# Patient Record
Sex: Female | Born: 1975
Health system: Southern US, Community
[De-identification: ages and names within clinical notes are randomized; demographics above are authoritative.]

## PROBLEM LIST (undated history)

## (undated) DIAGNOSIS — R51 Headache: Secondary | ICD-10-CM

## (undated) DIAGNOSIS — T7840XA Allergy, unspecified, initial encounter: Secondary | ICD-10-CM

## (undated) DIAGNOSIS — R519 Headache, unspecified: Secondary | ICD-10-CM

## (undated) DIAGNOSIS — H4710 Unspecified papilledema: Secondary | ICD-10-CM

## (undated) DIAGNOSIS — K30 Functional dyspepsia: Secondary | ICD-10-CM

## (undated) DIAGNOSIS — E559 Vitamin D deficiency, unspecified: Secondary | ICD-10-CM

## (undated) DIAGNOSIS — K219 Gastro-esophageal reflux disease without esophagitis: Secondary | ICD-10-CM

## (undated) DIAGNOSIS — M1990 Unspecified osteoarthritis, unspecified site: Secondary | ICD-10-CM

## (undated) DIAGNOSIS — F419 Anxiety disorder, unspecified: Secondary | ICD-10-CM

## (undated) DIAGNOSIS — K589 Irritable bowel syndrome without diarrhea: Secondary | ICD-10-CM

## (undated) DIAGNOSIS — I10 Essential (primary) hypertension: Secondary | ICD-10-CM

## (undated) DIAGNOSIS — R0981 Nasal congestion: Secondary | ICD-10-CM

## (undated) DIAGNOSIS — S248XXA Injury of other specified nerves of thorax, initial encounter: Secondary | ICD-10-CM

## (undated) DIAGNOSIS — T8859XA Other complications of anesthesia, initial encounter: Secondary | ICD-10-CM

## (undated) DIAGNOSIS — T4145XA Adverse effect of unspecified anesthetic, initial encounter: Secondary | ICD-10-CM

## (undated) HISTORY — DX: Gastro-esophageal reflux disease without esophagitis: K21.9

## (undated) HISTORY — DX: Unspecified papilledema: H47.10

## (undated) HISTORY — DX: Allergy, unspecified, initial encounter: T78.40XA

## (undated) HISTORY — DX: Functional dyspepsia: K30

## (undated) HISTORY — DX: Headache, unspecified: R51.9

## (undated) HISTORY — DX: Essential (primary) hypertension: I10

## (undated) HISTORY — DX: Nasal congestion: R09.81

## (undated) HISTORY — DX: Headache: R51

## (undated) HISTORY — PX: NO PAST SURGERIES: SHX2092

## (undated) HISTORY — DX: Injury of other specified nerves of thorax, initial encounter: S24.8XXA

## (undated) HISTORY — DX: Irritable bowel syndrome, unspecified: K58.9

## (undated) HISTORY — DX: Unspecified osteoarthritis, unspecified site: M19.90

## (undated) HISTORY — DX: Anxiety disorder, unspecified: F41.9

## (undated) HISTORY — DX: Vitamin D deficiency, unspecified: E55.9

---

## 1997-10-23 ENCOUNTER — Other Ambulatory Visit: Admission: RE | Admit: 1997-10-23 | Discharge: 1997-10-23 | Payer: Self-pay | Admitting: *Deleted

## 1999-12-13 ENCOUNTER — Emergency Department (HOSPITAL_COMMUNITY): Admission: EM | Admit: 1999-12-13 | Discharge: 1999-12-14 | Payer: Self-pay | Admitting: Emergency Medicine

## 2003-01-03 ENCOUNTER — Other Ambulatory Visit: Admission: RE | Admit: 2003-01-03 | Discharge: 2003-01-03 | Payer: Self-pay | Admitting: Obstetrics and Gynecology

## 2004-01-22 ENCOUNTER — Other Ambulatory Visit: Admission: RE | Admit: 2004-01-22 | Discharge: 2004-01-22 | Payer: Self-pay | Admitting: Obstetrics and Gynecology

## 2005-02-07 ENCOUNTER — Other Ambulatory Visit: Admission: RE | Admit: 2005-02-07 | Discharge: 2005-02-07 | Payer: Self-pay | Admitting: Obstetrics and Gynecology

## 2007-12-29 ENCOUNTER — Inpatient Hospital Stay (HOSPITAL_COMMUNITY): Admission: AD | Admit: 2007-12-29 | Discharge: 2008-01-01 | Payer: Self-pay | Admitting: Obstetrics and Gynecology

## 2007-12-30 ENCOUNTER — Encounter (INDEPENDENT_AMBULATORY_CARE_PROVIDER_SITE_OTHER): Payer: Self-pay | Admitting: Obstetrics and Gynecology

## 2008-01-04 ENCOUNTER — Encounter: Admission: RE | Admit: 2008-01-04 | Discharge: 2008-02-01 | Payer: Self-pay | Admitting: Obstetrics and Gynecology

## 2010-10-14 ENCOUNTER — Other Ambulatory Visit: Payer: Self-pay | Admitting: Obstetrics and Gynecology

## 2010-11-29 LAB — HIV ANTIBODY (ROUTINE TESTING W REFLEX): HIV: NONREACTIVE

## 2010-11-29 LAB — VARICELLA ZOSTER ANTIBODY, IGG: Varicella: IMMUNE

## 2010-11-29 LAB — RPR: RPR: NONREACTIVE

## 2010-11-29 LAB — GC/CHLAMYDIA PROBE AMP, GENITAL: Gonorrhea: NEGATIVE

## 2010-12-14 LAB — RPR: RPR Ser Ql: NONREACTIVE

## 2010-12-14 LAB — CBC
Hemoglobin: 13.1
MCHC: 34
MCHC: 34.3
MCV: 92.3
Platelets: 206
Platelets: 233
RDW: 13.3
RDW: 13.4
WBC: 11.5 — ABNORMAL HIGH

## 2011-02-02 ENCOUNTER — Other Ambulatory Visit (HOSPITAL_COMMUNITY): Payer: Self-pay | Admitting: Obstetrics and Gynecology

## 2011-02-02 DIAGNOSIS — O09299 Supervision of pregnancy with other poor reproductive or obstetric history, unspecified trimester: Secondary | ICD-10-CM

## 2011-03-01 ENCOUNTER — Ambulatory Visit (HOSPITAL_COMMUNITY): Payer: 59 | Attending: Obstetrics and Gynecology

## 2011-03-15 NOTE — L&D Delivery Note (Signed)
Delivery Note At 7:45 PM a viable and healthy female was delivered via Vaginal, Spontaneous Delivery (Presentation: Left Occiput Anterior).  APGAR: 9, 9; weight 5 lb 15.8 oz (2716 g).   Placenta status: Intact, Spontaneous.  Cord: 3 vessels with the following complications: Short.   The patient delivered a vigorous female infant in the vertex LOA presentation weighing 5#15.8 with apgars 9&9. The umbilical cord was short and was clamped and cut and the infant passed to the waiting maternal abdomen. The infant cried vigorously.  The placenta delivered spontaneously, intact with a 3VC.  Cord blood was attempted to be collected for donation but the amount collected was insufficient.  No lacerations required repair.  The patient had a boggy lower uterine segment which responded to bimanual massage.  of misoprostal were placed transrectally prophylactically   Anesthesia: Epidural  Episiotomy: None Lacerations: None Suture Repair: N/A Est. Blood Loss (mL): 450 cc  Mom to postpartum.  Baby to nursery-stable.  Desaree Downen H. 06/01/2011, 8:20 PM

## 2011-06-01 ENCOUNTER — Inpatient Hospital Stay (HOSPITAL_COMMUNITY): Payer: 59 | Admitting: Anesthesiology

## 2011-06-01 ENCOUNTER — Inpatient Hospital Stay (HOSPITAL_COMMUNITY)
Admission: RE | Admit: 2011-06-01 | Discharge: 2011-06-03 | DRG: 775 | Disposition: A | Discharge status: Home / Self Care | Payer: 59 | Source: Ambulatory Visit | Attending: Obstetrics and Gynecology | Admitting: Obstetrics and Gynecology

## 2011-06-01 ENCOUNTER — Encounter (HOSPITAL_COMMUNITY): Payer: Self-pay

## 2011-06-01 ENCOUNTER — Encounter (HOSPITAL_COMMUNITY): Payer: Self-pay | Admitting: Anesthesiology

## 2011-06-01 VITALS — BP 123/80 | HR 71 | Temp 97.8°F | Resp 18 | Ht 66.0 in | Wt 235.0 lb

## 2011-06-01 DIAGNOSIS — O4100X0 Oligohydramnios, unspecified trimester, not applicable or unspecified: Principal | ICD-10-CM | POA: Diagnosis present

## 2011-06-01 DIAGNOSIS — Z3480 Encounter for supervision of other normal pregnancy, unspecified trimester: Secondary | ICD-10-CM

## 2011-06-01 HISTORY — DX: Adverse effect of unspecified anesthetic, initial encounter: T41.45XA

## 2011-06-01 HISTORY — DX: Other complications of anesthesia, initial encounter: T88.59XA

## 2011-06-01 LAB — ABO/RH: ABO/RH(D): O POS

## 2011-06-01 LAB — CBC
HCT: 40.9 % (ref 36.0–46.0)
MCV: 89.3 fL (ref 78.0–100.0)
RBC: 4.58 MIL/uL (ref 3.87–5.11)
WBC: 10.5 10*3/uL (ref 4.0–10.5)

## 2011-06-01 LAB — TYPE AND SCREEN

## 2011-06-01 MED ORDER — BENZOCAINE-MENTHOL 20-0.5 % EX AERO
1.0000 "application " | INHALATION_SPRAY | CUTANEOUS | Status: DC | PRN
  Administered 2011-06-01: 1 via TOPICAL

## 2011-06-01 MED ORDER — BUTORPHANOL TARTRATE 2 MG/ML IJ SOLN: 1.0000 mg | INTRAMUSCULAR | Status: DC | PRN

## 2011-06-01 MED ORDER — LACTATED RINGERS IV SOLN: 500.0000 mL | Freq: Once | INTRAVENOUS | Status: DC

## 2011-06-01 MED ORDER — SIMETHICONE 80 MG PO CHEW: 80.0000 mg | CHEWABLE_TABLET | ORAL | Status: DC | PRN

## 2011-06-01 MED ORDER — FENTANYL 2.5 MCG/ML BUPIVACAINE 1/10 % EPIDURAL INFUSION (WH - ANES)
INTRAMUSCULAR | Status: DC | PRN
  Administered 2011-06-01: 13 mL/h via EPIDURAL

## 2011-06-01 MED ORDER — CITRIC ACID-SODIUM CITRATE 334-500 MG/5ML PO SOLN: 30.0000 mL | ORAL | Status: DC | PRN

## 2011-06-01 MED ORDER — ACETAMINOPHEN 325 MG PO TABS: 650.0000 mg | ORAL_TABLET | ORAL | Status: DC | PRN

## 2011-06-01 MED ORDER — METHYLERGONOVINE MALEATE 0.2 MG PO TABS: 0.2000 mg | ORAL_TABLET | ORAL | Status: DC | PRN

## 2011-06-01 MED ORDER — OXYTOCIN BOLUS FROM INFUSION
500.0000 mL | Freq: Once | INTRAVENOUS | Status: DC
  Filled 2011-06-01: qty 500
  Filled 2011-06-01: qty 1000

## 2011-06-01 MED ORDER — LIDOCAINE HCL (PF) 1 % IJ SOLN
30.0000 mL | INTRAMUSCULAR | Status: DC | PRN
  Filled 2011-06-01: qty 30

## 2011-06-01 MED ORDER — WITCH HAZEL-GLYCERIN EX PADS: 1.0000 "application " | MEDICATED_PAD | CUTANEOUS | Status: DC | PRN

## 2011-06-01 MED ORDER — PRENATAL MULTIVITAMIN CH
1.0000 | ORAL_TABLET | Freq: Every day | ORAL | Status: DC
  Administered 2011-06-02 – 2011-06-03 (×2): 1 via ORAL
  Filled 2011-06-01 (×2): qty 1

## 2011-06-01 MED ORDER — EPHEDRINE 5 MG/ML INJ
10.0000 mg | INTRAVENOUS | Status: DC | PRN
  Filled 2011-06-01: qty 4

## 2011-06-01 MED ORDER — OXYCODONE-ACETAMINOPHEN 5-325 MG PO TABS: 1.0000 | ORAL_TABLET | ORAL | Status: DC | PRN

## 2011-06-01 MED ORDER — ZOLPIDEM TARTRATE 10 MG PO TABS: 10.0000 mg | ORAL_TABLET | Freq: Every evening | ORAL | Status: DC | PRN

## 2011-06-01 MED ORDER — METHYLERGONOVINE MALEATE 0.2 MG/ML IJ SOLN: 0.2000 mg | INTRAMUSCULAR | Status: DC | PRN

## 2011-06-01 MED ORDER — IBUPROFEN 600 MG PO TABS: 600.0000 mg | ORAL_TABLET | Freq: Four times a day (QID) | ORAL | Status: DC | PRN

## 2011-06-01 MED ORDER — DIBUCAINE 1 % RE OINT: 1.0000 "application " | TOPICAL_OINTMENT | RECTAL | Status: DC | PRN

## 2011-06-01 MED ORDER — IBUPROFEN 600 MG PO TABS
600.0000 mg | ORAL_TABLET | Freq: Four times a day (QID) | ORAL | Status: DC
  Administered 2011-06-01 – 2011-06-03 (×6): 600 mg via ORAL
  Filled 2011-06-01 (×6): qty 1

## 2011-06-01 MED ORDER — SENNOSIDES-DOCUSATE SODIUM 8.6-50 MG PO TABS
2.0000 | ORAL_TABLET | Freq: Every day | ORAL | Status: DC
  Administered 2011-06-02: 2 via ORAL

## 2011-06-01 MED ORDER — PHENYLEPHRINE 40 MCG/ML (10ML) SYRINGE FOR IV PUSH (FOR BLOOD PRESSURE SUPPORT)
80.0000 ug | PREFILLED_SYRINGE | INTRAVENOUS | Status: DC | PRN
  Filled 2011-06-01: qty 5

## 2011-06-01 MED ORDER — EPHEDRINE 5 MG/ML INJ: 10.0000 mg | INTRAVENOUS | Status: DC | PRN

## 2011-06-01 MED ORDER — LIDOCAINE HCL (PF) 1 % IJ SOLN
INTRAMUSCULAR | Status: DC | PRN
  Administered 2011-06-01 (×2): 4 mL

## 2011-06-01 MED ORDER — BENZOCAINE-MENTHOL 20-0.5 % EX AERO
INHALATION_SPRAY | CUTANEOUS | Status: AC
  Administered 2011-06-01: 1 via TOPICAL
  Filled 2011-06-01: qty 56

## 2011-06-01 MED ORDER — ONDANSETRON HCL 4 MG/2ML IJ SOLN: 4.0000 mg | INTRAMUSCULAR | Status: DC | PRN

## 2011-06-01 MED ORDER — TERBUTALINE SULFATE 1 MG/ML IJ SOLN: 0.2500 mg | Freq: Once | INTRAMUSCULAR | Status: DC | PRN

## 2011-06-01 MED ORDER — LACTATED RINGERS IV SOLN
500.0000 mL | INTRAVENOUS | Status: DC | PRN
  Administered 2011-06-01 (×2): 1000 mL via INTRAVENOUS

## 2011-06-01 MED ORDER — LACTATED RINGERS IV SOLN
INTRAVENOUS | Status: DC
  Administered 2011-06-01 (×3): via INTRAVENOUS

## 2011-06-01 MED ORDER — TETANUS-DIPHTH-ACELL PERTUSSIS 5-2.5-18.5 LF-MCG/0.5 IM SUSP
0.5000 mL | Freq: Once | INTRAMUSCULAR | Status: AC
  Administered 2011-06-02: 0.5 mL via INTRAMUSCULAR
  Filled 2011-06-01: qty 0.5

## 2011-06-01 MED ORDER — ZOLPIDEM TARTRATE 5 MG PO TABS: 5.0000 mg | ORAL_TABLET | Freq: Every evening | ORAL | Status: DC | PRN

## 2011-06-01 MED ORDER — DIPHENHYDRAMINE HCL 50 MG/ML IJ SOLN: 12.5000 mg | INTRAMUSCULAR | Status: DC | PRN

## 2011-06-01 MED ORDER — FLEET ENEMA 7-19 GM/118ML RE ENEM: 1.0000 | ENEMA | RECTAL | Status: DC | PRN

## 2011-06-01 MED ORDER — FENTANYL 2.5 MCG/ML BUPIVACAINE 1/10 % EPIDURAL INFUSION (WH - ANES)
14.0000 mL/h | INTRAMUSCULAR | Status: DC
  Administered 2011-06-01: 14 mL/h via EPIDURAL
  Filled 2011-06-01 (×2): qty 60

## 2011-06-01 MED ORDER — DIPHENHYDRAMINE HCL 25 MG PO CAPS: 25.0000 mg | ORAL_CAPSULE | Freq: Four times a day (QID) | ORAL | Status: DC | PRN

## 2011-06-01 MED ORDER — OXYTOCIN 20 UNITS IN LACTATED RINGERS INFUSION - SIMPLE
1.0000 m[IU]/min | INTRAVENOUS | Status: DC
  Administered 2011-06-01: 999 mL/h via INTRAVENOUS
  Filled 2011-06-01: qty 1000

## 2011-06-01 MED ORDER — ONDANSETRON HCL 4 MG/2ML IJ SOLN: 4.0000 mg | Freq: Four times a day (QID) | INTRAMUSCULAR | Status: DC | PRN

## 2011-06-01 MED ORDER — OXYTOCIN 20 UNITS IN LACTATED RINGERS INFUSION - SIMPLE
125.0000 mL/h | Freq: Once | INTRAVENOUS | Status: AC
  Administered 2011-06-01: 999 mL/h via INTRAVENOUS

## 2011-06-01 MED ORDER — MISOPROSTOL 200 MCG PO TABS
ORAL_TABLET | ORAL | Status: AC
  Administered 2011-06-01: 800 ug
  Filled 2011-06-01: qty 4

## 2011-06-01 MED ORDER — ONDANSETRON HCL 4 MG PO TABS: 4.0000 mg | ORAL_TABLET | ORAL | Status: DC | PRN

## 2011-06-01 MED ORDER — PHENYLEPHRINE 40 MCG/ML (10ML) SYRINGE FOR IV PUSH (FOR BLOOD PRESSURE SUPPORT): 80.0000 ug | PREFILLED_SYRINGE | INTRAVENOUS | Status: DC | PRN

## 2011-06-01 MED ORDER — LANOLIN HYDROUS EX OINT: TOPICAL_OINTMENT | CUTANEOUS | Status: DC | PRN

## 2011-06-01 NOTE — Anesthesia Procedure Notes (Signed)
Epidural Patient location during procedure: OB Start time: 06/01/2011 11:45 AM  Staffing Anesthesiologist: Brinna Divelbiss A. Performed by: anesthesiologist   Preanesthetic Checklist Completed: patient identified, site marked, surgical consent, pre-op evaluation, timeout performed, IV checked, risks and benefits discussed and monitors and equipment checked  Epidural Patient position: sitting Prep: site prepped and draped and DuraPrep Patient monitoring: continuous pulse ox and blood pressure Approach: midline Injection technique: LOR air  Needle:  Needle type: Tuohy  Needle gauge: 17 G Needle length: 9 cm Needle insertion depth: 6 cm Catheter type: closed end flexible Catheter size: 19 Gauge Catheter at skin depth: 11 cm Test dose: negative and Other  Assessment Events: blood not aspirated, injection not painful, no injection resistance, negative IV test and no paresthesia  Additional Notes Patient identified. Risks and benefits discussed including failed block, incomplete  Pain control, post dural puncture headache, nerve damage, paralysis, blood pressure Changes, nausea, vomiting, reactions to medications-both toxic and allergic and post Partum back pain. All questions were answered. Patient expressed understanding and wished to proceed. Sterile technique was used throughout procedure. Epidural site was Dressed with sterile barrier dressing. No paresthesias, signs of intravascular injection Or signs of intrathecal spread were encountered.  Patient was more comfortable after the epidural was dosed. Please see RN's note for documentation of vital signs and FHR which are stable.

## 2011-06-01 NOTE — Progress Notes (Signed)
Pt lying on side

## 2011-06-01 NOTE — Progress Notes (Signed)
Patient ID: Tammy Olsen, female   DOB: 14-Feb-1976, 36 y.o.   MRN: [id number]  S: Comfortable with epidural O: Filed Vitals:   06/01/11 1434 06/01/11 1505 06/01/11 1535 06/01/11 1635  BP: 99/58 110/69  109/75  Pulse: 69 67  71  Temp:      TempSrc:      Resp: 18 18 18 18   Height:      Weight:      SpO2:       AOX3, NAD FHT 130-140 + accels, no decels Cvx 4/60/-2 Toco: q2  IUPC placed  A/P 1) FWB reassuring 2) Continue pit

## 2011-06-01 NOTE — Anesthesia Preprocedure Evaluation (Signed)
Anesthesia Evaluation  Patient identified by MRN, date of birth, ID band Patient awake    Reviewed: Allergy & Precautions, H&P , Patient's Chart, lab work & pertinent test results  Airway Mallampati: III TM Distance: >3 FB Neck ROM: full    Dental No notable dental hx. (+) Teeth Intact   Pulmonary neg pulmonary ROS,  breath sounds clear to auscultation  Pulmonary exam normal       Cardiovascular negative cardio ROS  Rhythm:regular Rate:Normal     Neuro/Psych negative neurological ROS  negative psych ROS   GI/Hepatic negative GI ROS, Neg liver ROS,   Endo/Other  Morbid obesity  Renal/GU negative Renal ROS  negative genitourinary   Musculoskeletal negative musculoskeletal ROS (+)   Abdominal Normal abdominal exam  (+)   Peds  Hematology negative hematology ROS (+)   Anesthesia Other Findings   Reproductive/Obstetrics (+) Pregnancy                           Anesthesia Physical Anesthesia Plan  ASA: III  Anesthesia Plan: Epidural   Post-op Pain Management:    Induction:   Airway Management Planned:   Additional Equipment:   Intra-op Plan:   Post-operative Plan:   Informed Consent: I have reviewed the patients History and Physical, chart, labs and discussed the procedure including the risks, benefits and alternatives for the proposed anesthesia with the patient or authorized representative who has indicated his/her understanding and acceptance.     Plan Discussed with: Anesthesiologist  Anesthesia Plan Comments:         Anesthesia Quick Evaluation

## 2011-06-01 NOTE — H&P (Signed)
  Admission H&P  CC: IOL for Oligohydramnios/IUGR HPI: Ms. Tammy Olsen is a 36 year old gravida 2 para 1001 who presents at 21 weeks and 6 days estimated gestational age for induction of labor for oligohydramnios. The patient has a history of IUGR with her first child and has been followed with serial growth scans during this pregnancy. At 32 week it was noted that the [facility] growth is beginning to lag as it was less than 2.3%. At this time, the patient began having growth scans every 2 weeks. With serial growth scans persistent AC lag was noted And by 36 weeks estimated fetal weight was less than 10%. At this point the patient was followed with serial BPPs/NSTs, UA Dopplers, and AFIs. Over the next several weeks it was noted that the umbilical artery Dopplers were beginning to increase, the growth restriction with persistent, and the AFI was declining. On ultrasound on Monday 3/18 the AFI was 4.9 and the decision was made to proceed with induction of labor for oligohydramnios and IUGR. The patient has had poor weight gain during this pregnancy. PMH: None PSH: None POB/GYN: G2P1001  2009: 38 wk IOL female infant 5#9. Indxn for IUGR PNL: Blood type O+, antibody screen negative, RPR nonreactive, rubella titer immune, hepatitis B surface antigen negative, HIV nonreactive, GBS negative, gonorrhea negative, Chlamydia negative, one-hour Glucola 113, first trimester screen no increased risk, second trimester AFP no increased risk. Anatomy ultrasound no gross anomalies, anterior placenta, female fetus. PE:  Filed Vitals:   06/01/11 0841  BP: 142/76  Pulse: 109  Temp: 98.1 F (36.7 C)  TempSrc: Oral  Resp: 18  Height: 5\' 6"  (1.676 m)  Weight: 106.595 kg (235 lb)   AOx3, NAD Gravid soft NT FHTs 145 reactive with accels no decels Cvx 2+/70/-2 Toco: irregualar  A/P 1) Admit 2) Induction with pitocin and AROM 3) Epidural on request

## 2011-06-02 LAB — RPR: RPR Ser Ql: NONREACTIVE

## 2011-06-02 LAB — CBC
HCT: 34.1 % — ABNORMAL LOW (ref 36.0–46.0)
Hemoglobin: 11.5 g/dL — ABNORMAL LOW (ref 12.0–15.0)
MCH: 29.9 pg (ref 26.0–34.0)
MCHC: 33.7 g/dL (ref 30.0–36.0)
RDW: 14 % (ref 11.5–15.5)

## 2011-06-02 MED ORDER — FLUOXETINE HCL 20 MG PO CAPS
20.0000 mg | ORAL_CAPSULE | Freq: Every day | ORAL | Status: DC
  Administered 2011-06-02 – 2011-06-03 (×2): 20 mg via ORAL
  Filled 2011-06-02 (×2): qty 1

## 2011-06-02 NOTE — Progress Notes (Signed)
PPD#1     Pt without c/o.  Has h.o. PP depression. Doing well at this time. VSSAF Lochia-wnl. IMP/ Stable Plan/ routine care.

## 2011-06-02 NOTE — Progress Notes (Signed)
Upon entering patient's room to give scheduled ibuprofen, patient is teary eyed and states that she would like to see if the MD on call could order medication for post partum depression.  Patient states that she has a history of PPD and was taking the generic of Effexor 27.5 mg daily prior to pregnancy.  Notified Dr. Dareen Piano of patient's request to start on medication that is safe for breastfeeding.  Gave patient emotional support.  Will continue to monitor closely.  Earl Gala, Linda Hedges [place]

## 2011-06-02 NOTE — Progress Notes (Signed)
UR Chart review completed.  

## 2011-06-02 NOTE — Anesthesia Postprocedure Evaluation (Signed)
  Anesthesia Post-op Note  Patient: Tammy Olsen  Procedure(s) Performed: * Lumbar Epidural for L&D *  Patient Location: Mother/Baby  Anesthesia Type: Epidural  Level of Consciousness: awake, alert  and oriented  Airway and Oxygen Therapy: Patient Spontanous Breathing  Post-op Pain: none  Post-op Assessment: Post-op Vital signs reviewed, Patient's Cardiovascular Status Stable, Respiratory Function Stable, Patent Airway, No signs of Nausea or vomiting, Adequate PO intake, Pain level controlled, No headache, No residual numbness and No residual motor weakness  Post-op Vital Signs: Reviewed and stable  Complications: No apparent anesthesia complications

## 2011-06-03 NOTE — Discharge Summary (Signed)
Discharge summary-  Discharge diagnoses-  #1-39 week intrauterine pregnancy delivered 5 pound 15.8 ounce female infant Apgars 9 and 9  #2-blood type O-positive  #3-induction for oligohydramnios at [redacted] weeks gestation-history of IUGR with Pregnancy #1  Procedures-induction of labor and normal spontaneous delivery.  Summary-this 36 year old gravida 2 para 1 was admitted at [redacted] weeks gestation for induction because of oligohydramnios. The induction went well and she had a normal spontaneous delivery of a 5 pound 15.8 ounce female infant with Apgars of 9 and 9 over an intact perineum. There was a short cord noted at the time of delivery. The mother's postpartum course was totally benign. She was breast-feeding successfully. A CBC on 3/21 was 11.5/14.1 with a platelet count of 206,000.  On the morning of 3/22 mother was ready for discharge. She was given discharge brochure all appropriate instructions and understood all instructions well. She will use Tylenol as needed for discomfort. She was given a prescription for Effexor 37.5 mg to use daily plus her history of postpartum depression-this was used after her first pregnancy as well.  She will return to the office for followup in approximately 4 weeks' time or as needed.

## 2011-06-09 ENCOUNTER — Inpatient Hospital Stay (HOSPITAL_COMMUNITY): Admission: AD | Admit: 2011-06-09 | Payer: Self-pay | Source: Ambulatory Visit | Admitting: Obstetrics and Gynecology

## 2011-08-04 DIAGNOSIS — Z975 Presence of (intrauterine) contraceptive device: Secondary | ICD-10-CM | POA: Insufficient documentation

## 2012-08-21 ENCOUNTER — Other Ambulatory Visit: Payer: Self-pay | Admitting: Obstetrics and Gynecology

## 2013-08-23 ENCOUNTER — Other Ambulatory Visit: Payer: Self-pay | Admitting: Obstetrics and Gynecology

## 2013-08-26 LAB — CYTOLOGY - PAP

## 2014-01-13 ENCOUNTER — Encounter (HOSPITAL_COMMUNITY): Payer: Self-pay

## 2014-04-02 ENCOUNTER — Encounter: Payer: Self-pay | Admitting: Neurology

## 2014-04-02 ENCOUNTER — Ambulatory Visit (INDEPENDENT_AMBULATORY_CARE_PROVIDER_SITE_OTHER): Payer: 59 | Admitting: Neurology

## 2014-04-02 VITALS — BP 118/78 | Ht 67.0 in | Wt 258.0 lb

## 2014-04-02 DIAGNOSIS — G4459 Other complicated headache syndrome: Secondary | ICD-10-CM

## 2014-04-02 DIAGNOSIS — H4710 Unspecified papilledema: Secondary | ICD-10-CM

## 2014-04-02 DIAGNOSIS — R51 Headache: Secondary | ICD-10-CM

## 2014-04-02 DIAGNOSIS — R0981 Nasal congestion: Secondary | ICD-10-CM

## 2014-04-02 DIAGNOSIS — H53131 Sudden visual loss, right eye: Secondary | ICD-10-CM

## 2014-04-02 DIAGNOSIS — R519 Headache, unspecified: Secondary | ICD-10-CM | POA: Insufficient documentation

## 2014-04-02 NOTE — Progress Notes (Addendum)
[facility]    Provider:  Dr Lucia Gaskins Referring Provider: [name], MD Primary Care Physician:  [name], MD  CC:  Swollen optic nerves  HPI:  Tammy Olsen is a 39 y.o. female here as a referral from Dr. Tenny Craw for Optic nerve edema.   She has been having some blurry vision at the end of the day or with straining on the computer and reading. She is having headaches in the middle of the night but hasn't had a headache in over a week. The headache is in the back of the head, throbbing, no light sensitiivy or sound sensitivity. She would have them a few times a week resolved with tylenol. The left eye vision is good (20/40) and the right eye she can only see light and moving objects which has been chronic since she was a child. No pain on eye movement. Headaches are not positional however she would get them when she is sleeping and in the recumbent position. Denies nausea/vomiting. No diplopia. No pulsatile hearing changes. No other focal neurologic symptoms.    Reviewed notes, labs and imaging from outside physicians, which showed: Notes from "myeyedr" exam with dilation(difficult to read handwriting) shows amblyopia, right 20/200, left 20/25 and right optic edema.   Review of Systems: Patient complains of symptoms per HPI as well as the following symptoms: allergies, headache, anxiety. Pertinent negatives per HPI. All others negative.   History   Social History  . Marital Status: Married    Spouse Name: [name]    Number of Children: 2  . Years of Education: college   Occupational History  . Not on file.   Social History Main Topics  . Smoking status: Never Smoker   . Smokeless tobacco: Never Used  . Alcohol Use: No  . Drug Use: No  . Sexual Activity: Yes   Other Topics Concern  . Not on file   Social History Narrative   Patient lives at home with her husband [name]).   Patient works full time Presenter, broadcasting.   Education college.   Right  handed.   Caffeine two cups daily.    Family History  Problem Relation Age of Onset  . Diabetes Maternal Grandmother   . Diabetes Maternal Grandfather   . Cancer Paternal Grandmother   . Arthritis Mother   . Cancer - Prostate Father   . Gout Father     Past Medical History  Diagnosis Date  . Complication of anesthesia     BP and HR issues "they would not let me sleep because my heart rate would drop"  . Sinus congestion   . HA (headache)   . Optic nerve edema   . Injury of long thoracic nerve     right    Past Surgical History  Procedure Laterality Date  . No past surgeries      Current Outpatient Prescriptions  Medication Sig Dispense Refill  . acetaminophen (TYLENOL) 325 MG tablet Take 650 mg by mouth every 6 (six) hours as needed. Takes for headaches    . cetirizine (ZYRTEC) 10 MG tablet Take 10 mg by mouth daily.    . Prenatal Vit-Fe Fumarate-FA (PRENATAL MULTIVITAMIN) TABS Take 1 tablet by mouth every morning.    . venlafaxine (EFFEXOR) 75 MG tablet Take 75 mg by mouth daily.     No current facility-administered medications for this visit.    Allergies as of 04/02/2014  . (No Known Allergies)    Vitals: BP 118/78 mmHg  Ht 5\' 7"  (1.702 m)  Wt 258 lb (117.028 kg)  BMI 40.40 kg/m2 Last Weight:  Wt Readings from Last 1 Encounters:  04/02/14 258 lb (117.028 kg)   Last Height:   Ht Readings from Last 1 Encounters:  04/02/14 5\' 7"  (1.702 m)   Physical exam: Exam: Gen: NAD, conversant, well nourised, well groomed             CV: RRR, no MRG. No Carotid Bruits. No peripheral edema, warm, nontender Eyes: Conjunctivae clear without exudates or hemorrhage  Neuro: Detailed Neurologic Exam  Speech:    Speech is normal; fluent and spontaneous with normal comprehension.  Cognition:    The patient is oriented to person, place, and time;     recent and remote memory intact;     language fluent;     normal attention, concentration,     fund of  knowledge Cranial Nerves:    The pupils are equal, round, and reactive to light. Mild right disk edema? Visual fields are full to finger confrontation on the left to finger count but on the right can only see waving fingers. Extraocular movements are intact. Trigeminal sensation is intact and the muscles of mastication are normal. The face is symmetric. The palate elevates in the midline. Hearing intact. Voice is normal. Shoulder shrug is normal. The tongue has normal motion without fasciculations.   Coordination:    Normal finger to nose and heel to shin. Normal rapid alternating movements.   Gait:    Heel-toe and tandem gait are normal.   Motor Observation:    No asymmetry, no atrophy, and no involuntary movements noted. Tone:    Normal muscle tone.    Posture:    Posture is normal. normal erect    Strength:    Strength is V/V in the upper and lower limbs.      Sensation: intact to LT     Reflex Exam:  DTR's:    Deep tendon reflexes in the upper and lower extremities are normal bilaterally.   Toes:    The toes are downgoing bilaterally.   Clonus:    Clonus is absent.       Assessment/Plan:  39 year old female with HTN who presents with unilateral  right disk abnormalities and worsening/blurry vision. Denies any other symptoms. May be pseudopapilledema but need to consider other causes such as intracranial hypertension due to mass lesion or pseudotumor, malignant HTN, papillitis. If neuroimaging is negative will proceed to LP, visual evoked potentials and VF testing. MRI of the brain and orbits as well as MRV. Will order labs for other disorders that can be implicated in papilledema such as hypercoagulable disorders.   Addendum 06/29/2014: [facility] received June 17 2014 confirms bilateral optic disc edema with suspicion for papilledema ophthalmology suggested MRV and lumbar plan grade 1 papilledema bilaterally visual fields appeared intact  Naomie DeanAntonia  Nadir Vasques, MD  [facility] [address] [place], [place] [id number]  Phone [phone] Fax [phone]

## 2014-04-02 NOTE — Patient Instructions (Signed)
Overall you are doing fairly well but I do want to suggest a few things today:   Remember to drink plenty of fluid, eat healthy meals and do not skip any meals. Try to eat protein with a every meal and eat a healthy snack such as fruit or nuts in between meals. Try to keep a regular sleep-wake schedule and try to exercise daily, particularly in the form of walking, 20-30 minutes a day, if you can.   As far as diagnostic testing: Imaging of the brain and labwork  I would like to see you back 3 months, sooner if we need to. Please call us with any interim questions, concerns, problems, updates or refill requests.   Please also call us for any test results so we can go over those with you on the phone.  My clinical assistant and will answer any of your questions and relay your messages to me and also relay most of my messages to you.   Our phone number is [phone]. We also have an after hours call service for urgent matters and there is a physician on-call for urgent questions. For any emergencies you know to call 911 or go to the nearest emergency room

## 2014-04-04 LAB — HEAVY METALS, BLOOD
ARSENIC: 5 ug/L (ref 2–23)
Lead, Blood: NOT DETECTED ug/dL (ref 0–19)
Mercury: NOT DETECTED ug/L (ref 0.0–14.9)

## 2014-04-04 LAB — C-REACTIVE PROTEIN: CRP: 17.5 mg/L — AB (ref 0.0–4.9)

## 2014-04-06 ENCOUNTER — Encounter: Payer: Self-pay | Admitting: Neurology

## 2014-04-07 ENCOUNTER — Telehealth: Payer: Self-pay

## 2014-04-07 NOTE — Telephone Encounter (Signed)
MRI of the brain with and without contrast is the most important study. Thank you!

## 2014-04-09 LAB — HYPERCOAGULABLE PANEL, COMPREHENSIVE
ACT. PRT C RESIST W/FV DEFIC.: 2.2 ratio
APTT: 36.4 s
AT III Act/Nor PPP Chro: 113 %
DRVVT Confirm Seconds: 40.5 s
DRVVT Ratio: 1.3 ratio — ABNORMAL HIGH
DRVVT SCREEN SECONDS: 63.5 s — AB
Factor VII Antigen**: 142 %
Factor VIII Activity: 120 %
HEXAGONAL PHOSPHOLIPID NEUTRAL: 5 s
Prot C Ag Act/Nor PPP Imm: 88 %
Prot S Ag Act/Nor PPP Imm: 111 %
Protein C Ag/FVII Ag Ratio**: 0.6 ratio
Protein S Ag/FVII Ag Ratio**: 0.8 ratio

## 2014-04-09 LAB — COMPREHENSIVE METABOLIC PANEL
ALT: 12 IU/L (ref 0–32)
AST: 15 IU/L (ref 0–40)
Albumin/Globulin Ratio: 1.5 (ref 1.1–2.5)
Albumin: 4.1 g/dL (ref 3.5–5.5)
Alkaline Phosphatase: 99 IU/L (ref 39–117)
BUN/Creatinine Ratio: 13 (ref 8–20)
BUN: 8 mg/dL (ref 6–20)
CALCIUM: 9 mg/dL (ref 8.7–10.2)
CHLORIDE: 103 mmol/L (ref 97–108)
CO2: 21 mmol/L (ref 18–29)
Creatinine, Ser: 0.64 mg/dL (ref 0.57–1.00)
GFR calc non Af Amer: 115 mL/min/{1.73_m2} (ref >59)
GFR, EST AFRICAN AMERICAN: 133 mL/min/{1.73_m2} (ref >59)
Globulin, Total: 2.8 g/dL (ref 1.5–4.5)
Glucose: 98 mg/dL (ref 65–99)
Potassium: 4.4 mmol/L (ref 3.5–5.2)
SODIUM: 138 mmol/L (ref 134–144)
Total Bilirubin: 0.5 mg/dL (ref 0.0–1.2)
Total Protein: 6.9 g/dL (ref 6.0–8.5)

## 2014-04-09 LAB — CBC
HEMATOCRIT: 43 % (ref 34.0–46.6)
Hemoglobin: 14.8 g/dL (ref 11.1–15.9)
MCH: 29.5 pg (ref 26.6–33.0)
MCHC: 34.4 g/dL (ref 31.5–35.7)
MCV: 86 fL (ref 79–97)
PLATELETS: 342 10*3/uL (ref 150–379)
RBC: 5.02 x10E6/uL (ref 3.77–5.28)
RDW: 13.8 % (ref 12.3–15.4)
WBC: 8.5 10*3/uL (ref 3.4–10.8)

## 2014-04-09 LAB — HEMOGLOBIN A1C
ESTIMATED AVERAGE GLUCOSE: 97 mg/dL
HEMOGLOBIN A1C: 5 % (ref 4.8–5.6)

## 2014-04-09 LAB — TSH: TSH: 1.77 u[IU]/mL (ref 0.450–4.500)

## 2014-04-09 LAB — SEDIMENTATION RATE: Sed Rate: 32 mm/hr (ref 0–32)

## 2014-04-24 ENCOUNTER — Ambulatory Visit (INDEPENDENT_AMBULATORY_CARE_PROVIDER_SITE_OTHER): Payer: 59

## 2014-04-24 DIAGNOSIS — G4459 Other complicated headache syndrome: Secondary | ICD-10-CM

## 2014-04-24 DIAGNOSIS — H53131 Sudden visual loss, right eye: Secondary | ICD-10-CM

## 2014-04-24 DIAGNOSIS — H4710 Unspecified papilledema: Secondary | ICD-10-CM

## 2014-04-25 MED ORDER — GADOPENTETATE DIMEGLUMINE 469.01 MG/ML IV SOLN: 20.0000 mL | Freq: Once | INTRAVENOUS | Status: AC | PRN

## 2014-04-29 ENCOUNTER — Other Ambulatory Visit: Payer: Self-pay | Admitting: Neurology

## 2014-04-29 ENCOUNTER — Telehealth: Payer: Self-pay | Admitting: *Deleted

## 2014-04-29 DIAGNOSIS — H4710 Unspecified papilledema: Secondary | ICD-10-CM

## 2014-04-29 NOTE — Telephone Encounter (Signed)
Talked with patient about normal MRI results. Patient verbalized understanding. Patient wondering what the plan is from here. I will route message to Dr. Lucia GaskinsAhern regarding this issue.

## 2014-04-29 NOTE — Telephone Encounter (Signed)
Tammy Olsen- Let me know if you feel comfortable discussing the following with patient. I think we should proceed with lumbar puncture for opening pressure and also referring to ophthalmology for formal visual field testing. I discussed all of this with patient at appointment. Thank you.

## 2014-04-30 NOTE — Telephone Encounter (Signed)
I spoke to patient. Her headaches are resolved, she declines LP for opening pressure. She is going to see Ophthalmology. Thanks.

## 2014-05-13 ENCOUNTER — Other Ambulatory Visit: Payer: Self-pay | Admitting: Neurology

## 2014-05-13 DIAGNOSIS — H4710 Unspecified papilledema: Secondary | ICD-10-CM

## 2014-05-13 DIAGNOSIS — G932 Benign intracranial hypertension: Secondary | ICD-10-CM

## 2014-05-19 ENCOUNTER — Telehealth: Payer: Self-pay | Admitting: Neurology

## 2014-05-19 ENCOUNTER — Telehealth: Payer: Self-pay | Admitting: *Deleted

## 2014-05-19 NOTE — Telephone Encounter (Signed)
Talked with [facility] and clarified what Dr. Lucia GaskinsAhern wanted for MRI/MRV per Dr. Lucia GaskinsAhern. Pt scheduled for all tests per [name].

## 2014-05-19 NOTE — Telephone Encounter (Addendum)
[facility] is calling in regard to scheduling of MRIs for this patient.  Please call

## 2014-06-02 ENCOUNTER — Other Ambulatory Visit: Payer: Self-pay | Admitting: Neurology

## 2014-06-02 ENCOUNTER — Ambulatory Visit
Admission: RE | Admit: 2014-06-02 | Discharge: 2014-06-02 | Disposition: A | Discharge status: Home / Self Care | Payer: PRIVATE HEALTH INSURANCE | Source: Ambulatory Visit | Attending: Neurology | Admitting: Neurology

## 2014-06-02 DIAGNOSIS — G932 Benign intracranial hypertension: Secondary | ICD-10-CM

## 2014-06-02 DIAGNOSIS — H4710 Unspecified papilledema: Secondary | ICD-10-CM

## 2014-06-02 LAB — CSF CELL COUNT WITH DIFFERENTIAL
RBC Count, CSF: 0 cu mm
Tube #: 3
WBC CSF: 1 uL (ref 0–5)

## 2014-06-02 LAB — GLUCOSE, CSF: Glucose, CSF: 63 mg/dL (ref 43–76)

## 2014-06-02 LAB — PROTEIN, CSF: TOTAL PROTEIN, CSF: 34 mg/dL (ref 15–45)

## 2014-06-02 NOTE — Discharge Instructions (Signed)

## 2014-06-05 ENCOUNTER — Other Ambulatory Visit: Payer: Self-pay | Admitting: Neurology

## 2014-06-05 ENCOUNTER — Telehealth: Payer: Self-pay | Admitting: Neurology

## 2014-06-05 MED ORDER — ACETAZOLAMIDE 250 MG PO TABS: 250.0000 mg | ORAL_TABLET | Freq: Two times a day (BID) | ORAL | Status: DC

## 2014-06-05 NOTE — Telephone Encounter (Signed)
Spoke to patient, will start Diamox due to opening pressure 21 (mildly abnormal, however ophthalmology did agree with papilledema and also saw hemorrhages) and new diagnosis of IIH.  Kara MeadEmma - Can you please get the ophthalmoloy notes for me from her appointment including office visit and visual field testing? She went to the following office:[facility] [phone]    thank you

## 2014-06-06 ENCOUNTER — Telehealth: Payer: Self-pay | Admitting: *Deleted

## 2014-06-06 ENCOUNTER — Ambulatory Visit
Admission: RE | Admit: 2014-06-06 | Discharge: 2014-06-06 | Disposition: A | Discharge status: Home / Self Care | Payer: PRIVATE HEALTH INSURANCE | Source: Ambulatory Visit | Attending: Neurology | Admitting: Neurology

## 2014-06-06 ENCOUNTER — Other Ambulatory Visit: Payer: Self-pay

## 2014-06-06 DIAGNOSIS — H4710 Unspecified papilledema: Secondary | ICD-10-CM

## 2014-06-06 DIAGNOSIS — G4459 Other complicated headache syndrome: Secondary | ICD-10-CM

## 2014-06-06 DIAGNOSIS — H53131 Sudden visual loss, right eye: Secondary | ICD-10-CM

## 2014-06-06 MED ORDER — GADOBENATE DIMEGLUMINE 529 MG/ML IV SOLN
20.0000 mL | Freq: Once | INTRAVENOUS | Status: AC | PRN
  Administered 2014-06-06: 20 mL via INTRAVENOUS

## 2014-06-06 NOTE — Telephone Encounter (Signed)
Talked with pt and explained we needed her to fill out a record release form to receive office notes and visual field test results [facility]. Pt requested I fax the form to her and she will bring it in completed.

## 2014-06-09 ENCOUNTER — Telehealth: Payer: Self-pay | Admitting: Neurology

## 2014-06-09 ENCOUNTER — Other Ambulatory Visit: Payer: Self-pay | Admitting: Neurology

## 2014-06-09 MED ORDER — ACETAZOLAMIDE ER 500 MG PO CP12: 500.0000 mg | ORAL_CAPSULE | Freq: Two times a day (BID) | ORAL | Status: DC

## 2014-06-09 NOTE — Telephone Encounter (Signed)
Spoke to patient. MRV did not show sinus thrombosis. MRA of the orbits (below) c/w IIH. Started patient on diamox 250mg  bid. Instructed her to increase to 500mg  bid in 2 weeks. She has a follow up with ophthalmology in May, she can follow up with me afterwards.   1. Enlarged optic nerve sheaths and partially empty sella. These are non-specific findings, but can be seen in association with idiopathic intracranial hypertension.  2. No abnormal enhancing or compressive optic nerve lesions.

## 2014-06-13 ENCOUNTER — Telehealth: Payer: Self-pay | Admitting: *Deleted

## 2014-06-13 NOTE — Telephone Encounter (Signed)
Gave record release form that we received this a.m. to medical records to receive visual field test results and [facility].

## 2014-06-13 NOTE — Telephone Encounter (Signed)
[facility] for Dr Lucia GaskinsAhern 06-13-14.

## 2014-06-17 NOTE — Telephone Encounter (Signed)
[facility] Lucia GaskinsAhern requested 06-17-14.

## 2014-07-28 ENCOUNTER — Ambulatory Visit (INDEPENDENT_AMBULATORY_CARE_PROVIDER_SITE_OTHER): Payer: 59 | Admitting: Neurology

## 2014-07-28 ENCOUNTER — Encounter: Payer: Self-pay | Admitting: Neurology

## 2014-07-28 VITALS — BP 133/84 | HR 87 | Ht 67.0 in | Wt 255.8 lb

## 2014-07-28 DIAGNOSIS — G932 Benign intracranial hypertension: Secondary | ICD-10-CM | POA: Diagnosis not present

## 2014-07-28 MED ORDER — FUROSEMIDE 20 MG PO TABS: 20.0000 mg | ORAL_TABLET | Freq: Every day | ORAL | Status: DC

## 2014-07-28 MED ORDER — ACETAZOLAMIDE 250 MG PO TABS: 250.0000 mg | ORAL_TABLET | Freq: Three times a day (TID) | ORAL | Status: DC

## 2014-07-28 NOTE — Patient Instructions (Signed)
Overall you are doing fairly well but I do want to suggest a few things today:   Remember to drink plenty of fluid, eat healthy meals and do not skip any meals. Try to eat protein with a every meal and eat a healthy snack such as fruit or nuts in between meals. Try to keep a regular sleep-wake schedule and try to exercise daily, particularly in the form of walking, 20-30 minutes a day, if you can.   As far as your medications are concerned, I would like to suggest Try to increase diamox 250mg  three times a day Lasix (Furosemide) 20mg  daily  I would like to see you back after ophthalmology appt, sooner if we need to. Please call us with any interim questions, concerns, problems, updates or refill requests.   Please also call us for any test results so we can go over those with you on the phone.  My clinical assistant and will answer any of your questions and relay your messages to me and also relay most of my messages to you.   Our phone number is [phone]. We also have an after hours call service for urgent matters and there is a physician on-call for urgent questions. For any emergencies you know to call 911 or go to the nearest emergency room

## 2014-07-28 NOTE — Progress Notes (Addendum)
[facility]    Provider:  Dr Lucia Gaskins Referring Provider: [name], MD Primary Care Physician:  [name], MD  CC: Swollen optic nerves  Interval update 07/28/2014:  Papilledema was confirmed by [facility] opthalmology, grade 1 papilledema bilaterally visual fields appeared intact. MRI brain and orbits and MRV c/w IIH. She was started on Diamox.Headaches are fine. She is on Diamox  twice daily. She reports fatigue during the day. She can fall asleep instantly during the day but always about 3pm. No apneic events per husband.   She saw [facility] ophthalmology: On 07/25/2014, Dr. Sinda Du. He stated the patient's uncorrected visual acuity was 20/30 in the right eye and 20 over 20 in the left, stable from her prior visit. Intraocular pressures were 10 in each eye without relative affair and pupillary defect. Anterior segment examination unremarkable and posterior segment examination continues to demonstrate grade 2 papilledema in each eye with resolving hemorrhages. The remainder of the posterior segment all unremarkable. Assessment was that the patient has idiopathic intracranial hypertension was stable nerve elevation although resolving hemorrhages. Visual fields continue to be normal. Plan is to repeat her visual fields and photos in 8 weeks.   Initial visit 04/02/2013: Tammy Olsen is a 39 y.o. female here as a referral from Dr. Tenny Craw for Optic nerve edema.   She has been having some blurry vision at the end of the day or with straining on the computer and reading. She is having headaches in the middle of the night but hasn't had a headache in over a week. The headache is in the back of the head, throbbing, no light sensitiivy or sound sensitivity. She would have them a few times a week resolved with tylenol. The left eye vision is good (20/40) and the right eye she can only see light and moving objects which has been chronic since she was a child. No pain  on eye movement. Headaches are not positional however she would get them when she is sleeping and in the recumbent position. Denies nausea/vomiting. No diplopia. No pulsatile hearing changes. No other focal neurologic symptoms.    MRi orbits IMPRESSION:  Equivocal MRI orbits (with and without) demonstrating: 1. Enlarged optic nerve sheaths and partially empty sella. These are non-specific findings, but can be seen in association with idiopathic intracranial hypertension.  2. No abnormal enhancing or compressive optic nerve lesions  MRV IMPRESSION:  Abnormal MRV head (without) demonstrating: 1. The right transverse sinus has a possible focal stenosis (series 14 image 14). The transition from right transverse to sigmoid sinus has signal drop out (serires 14 image 7) and may be due to stenosis or artifact. The right internal jugular vein have no stenosis. These findings may be due to congential hypoplastic changes, flow artifacts, or may be seen in association with idiopathic intracranial hypertension. Review/correlation of these findings with normal MRI brain findings recently make focal venous sinus thromboses unlikely.  2. Flow related artifact in the posterior inferior superior sagittal sinus. 3. Left sided venous drainage is unremarkable.  MRi brain IMPRESSION: Unremarkable MRI scan of the brain with and without contrast. Incidental changes of mild chronic paranasal sinusitis.  Reviewed notes, labs and imaging from outside physicians, which showed: Notes from "[facility]" exam with dilation(difficult to read handwriting) shows amblyopia, right 20/200, left 20/25 and right optic edema.  Review of Systems: Patient complains of symptoms per HPI as well as the following symptoms; No CP, no SOB. Pertinent negatives per HPI. All others negative.   History  Social History  . Marital Status: Married    Spouse Name: Koleen Nimroddrian  . Number of Children: 2  . Years of Education: college    Occupational History  . Not on file.   Social History Main Topics  . Smoking status: Never Smoker   . Smokeless tobacco: Never Used  . Alcohol Use: No  . Drug Use: No  . Sexual Activity: Yes   Other Topics Concern  . Not on file   Social History Narrative   Patient lives at home with her husband [name]([place]reian).   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   Education college.   Right handed.   Caffeine two cups daily.    Family History  Problem Relation Age of Onset  . Diabetes Maternal Grandmother   . Diabetes Maternal Grandfather   . Cancer Paternal Grandmother   . Arthritis Mother   . Cancer - Prostate Father   . Gout Father     Past Medical History  Diagnosis Date  . Complication of anesthesia     BP and HR issues "they would not let me sleep because my heart rate would drop"  . Sinus congestion   . HA (headache)   . Optic nerve edema   . Injury of long thoracic nerve     right    Past Surgical History  Procedure Laterality Date  . No past surgeries      Current Outpatient Prescriptions  Medication Sig Dispense Refill  . acetaminophen (TYLENOL) 325 MG tablet Take 650 mg by mouth every 6 (six) hours as needed. Takes for headaches    . cetirizine (ZYRTEC) 10 MG tablet Take 10 mg by mouth daily.    . Prenatal Vit-Fe Fumarate-FA (PRENATAL MULTIVITAMIN) TABS Take 1 tablet by mouth every morning.    . venlafaxine (EFFEXOR) 75 MG tablet Take 75 mg by mouth daily.    Marland Kitchen. acetaZOLAMIDE (DIAMOX) 250 MG tablet Take 250 mg by mouth 2 (two) times daily.     No current facility-administered medications for this visit.    Allergies as of 07/28/2014  . (No Known Allergies)    Vitals: BP 133/84 mmHg  Pulse 87  Ht 5\' 7"  (1.702 m)  Wt 255 lb 12.8 oz (116.03 kg)  BMI 40.05 kg/m2 Last Weight:  Wt Readings from Last 1 Encounters:  07/28/14 255 lb 12.8 oz (116.03 kg)   Last Height:   Ht Readings from Last 1 Encounters:  07/28/14 5\' 7"  (1.702 m)           Assessment/Plan: 39 year old female with IIH. Could not tolerate 500mg  bid diamox and is on 250mg  bid. Ophthalmology repeat eval shows improved hemorrhages but still pappiledema bilaterally  Will try and increase to diamox tid 250 Will add lasic 20mg  daily  Addendum 06/29/2014: [facility] 17 2014 confirms bilateral optic disc edema with suspicion for papilledema ophthalmology suggested MRV and lumbar puncture. grade 1 papilledema bilaterally visual fields appeared intact  [facility]: On 07/25/2014, Dr. Sinda DuBradley Bowen. He stated the patient's uncorrected visual acuity was 20/30 in the right eye and 20 over 20 in the left, stable from her prior visit. Intraocular pressures were 10 in each eye without relative affair and pupillary defect. Anterior segment examination unremarkable and posterior segment examination continues to demonstrate grade 2 papilledema in each eye with resolving hemorrhages. The remainder of the posterior segment all unremarkable. Assessment was that the patient has idiopathic intracranial hypertension was stable nerve elevation although  resolving hemorrhages. Visual fields continue to be normal. Plan is to repeat her visual fields and photos in 8 weeks.   Naomie DeanAntonia Ahern, MD  [facility] [address] [place], [place] [id number]  Phone [phone] Fax [phone]  A total of 15 minutes was spent face-to-face with this patient. Over half this time was spent on counseling patient on the Pappiledema and IIH diagnosis and different diagnostic and therapeutic options available.

## 2014-07-29 ENCOUNTER — Telehealth: Payer: Self-pay

## 2014-07-29 LAB — BASIC METABOLIC PANEL
BUN/Creatinine Ratio: 11 (ref 8–20)
BUN: 9 mg/dL (ref 6–20)
CALCIUM: 9.4 mg/dL (ref 8.7–10.2)
CHLORIDE: 105 mmol/L (ref 97–108)
CO2: 19 mmol/L (ref 18–29)
Creatinine, Ser: 0.79 mg/dL (ref 0.57–1.00)
GFR calc Af Amer: 111 mL/min/{1.73_m2} (ref >59)
GFR calc non Af Amer: 96 mL/min/{1.73_m2} (ref >59)
Glucose: 88 mg/dL (ref 65–99)
POTASSIUM: 4 mmol/L (ref 3.5–5.2)
SODIUM: 142 mmol/L (ref 134–144)

## 2014-07-29 NOTE — Telephone Encounter (Signed)
VM left to inform patient of normal lab values, patient has been asked to call office to receive results.  Thanks

## 2014-07-29 NOTE — Telephone Encounter (Signed)
Patient returned call. Please call and advise.  °

## 2014-07-29 NOTE — Telephone Encounter (Signed)
Pt was informed of normal lab results

## 2014-09-24 ENCOUNTER — Other Ambulatory Visit: Payer: Self-pay | Admitting: Obstetrics and Gynecology

## 2014-09-25 LAB — CYTOLOGY - PAP

## 2014-10-01 ENCOUNTER — Ambulatory Visit (INDEPENDENT_AMBULATORY_CARE_PROVIDER_SITE_OTHER): Payer: 59 | Admitting: Neurology

## 2014-10-01 ENCOUNTER — Encounter: Payer: Self-pay | Admitting: Neurology

## 2014-10-01 VITALS — BP 137/90 | HR 100 | Ht 67.0 in | Wt 253.0 lb

## 2014-10-01 DIAGNOSIS — G932 Benign intracranial hypertension: Secondary | ICD-10-CM | POA: Diagnosis not present

## 2014-10-01 NOTE — Progress Notes (Signed)
[facility]    Provider:  Dr Lucia GaskinsAhern Referring Provider: Waynard Reedsoss, Kendra, MD Primary Care Physician:  Almon HerculesOSS,KENDRA H., MD   CC: Swollen optic nerves  Interval update 720/2016:   She was having significant side effects from the Diamox, now the tingling in the fingers are mostly gone. The floaters in her vision are gone. No blurry vision. No transient visual obscurations. No headaches. Followed up with Dr. Cathey EndowBowen, ophthalmologist recently and VF intact.    Interval update 07/28/2014:  [facility], grade 1 papilledema bilaterally visual fields appeared intact. MRI brain and orbits and MRV c/w IIH. She was started on Diamox.Headaches are fine. She is on Diamox 250mg  twice daily. She reports fatigue during the day. She can fall asleep instantly during the day but always about 3pm. No apneic events per husband.   [facility] On 07/25/2014, Dr. Sinda DuBradley Bowen. He stated the patient's uncorrected visual acuity was 20/30 in the right eye and 20 over 20 in the left, stable from her prior visit. Intraocular pressures were 10 in each eye without relative affair and pupillary defect. Anterior segment examination unremarkable and posterior segment examination continues to demonstrate grade 2 papilledema in each eye with resolving hemorrhages. The remainder of the posterior segment all unremarkable. Assessment was that the patient has idiopathic intracranial hypertension was stable nerve elevation although resolving hemorrhages. Visual fields continue to be normal. Plan is to repeat her visual fields and photos in 8 weeks.   Initial visit 04/02/2013: Tammy Olsen is a 39 y.o. female here as a referral from Dr. Tenny Crawoss for Optic nerve edema.   She has been having some blurry vision at the end of the day or with straining on the computer and reading. She is having headaches in the middle of the night but hasn't had a headache in over  a week. The headache is in the back of the head, throbbing, no light sensitiivy or sound sensitivity. She would have them a few times a week resolved with tylenol. The left eye vision is good (20/40) and the right eye she can only see light and moving objects which has been chronic since she was a child. No pain on eye movement. Headaches are not positional however she would get them when she is sleeping and in the recumbent position. Denies nausea/vomiting. No diplopia. No pulsatile hearing changes. No other focal neurologic symptoms.    MRi orbits IMPRESSION:  Equivocal MRI orbits (with and without) demonstrating: 1. Enlarged optic nerve sheaths and partially empty sella. These are non-specific findings, but can be seen in association with idiopathic intracranial hypertension.  2. No abnormal enhancing or compressive optic nerve lesions  MRV IMPRESSION:  Abnormal MRV head (without) demonstrating: 1. The right transverse sinus has a possible focal stenosis (series 14 image 14). The transition from right transverse to sigmoid sinus has signal drop out (serires 14 image 7) and may be due to stenosis or artifact. The right internal jugular vein have no stenosis. These findings may be due to congential hypoplastic changes, flow artifacts, or may be seen in association with idiopathic intracranial hypertension. Review/correlation of these findings with normal MRI brain findings recently make focal venous sinus thromboses unlikely.  2. Flow related artifact in the posterior inferior superior sagittal sinus. 3. Left sided venous drainage is unremarkable.  MRi brain IMPRESSION: Unremarkable MRI scan of the brain with and without contrast. Incidental changes of mild chronic paranasal sinusitis.  Review of Systems: Patient complains of  symptoms per HPI as well as the following symptoms: no fever, no chills, no SOB, no CP. Pertinent negatives per HPI. All others negative.   History   Social History    . Marital Status: Married    Spouse Name: [name]  . Number of Children: 2  . Years of Education: college   Occupational History  . Not on file.   Social History Main Topics  . Smoking status: Never Smoker   . Smokeless tobacco: Never Used  . Alcohol Use: No  . Drug Use: No  . Sexual Activity: Yes   Other Topics Concern  . Not on file   Social History Narrative   Patient lives at home with her husband [name]).   Patient works full time Presenter, broadcasting.   Education college.   Right handed.   Caffeine two cups daily.    Family History  Problem Relation Age of Onset  . Diabetes Maternal Grandmother   . Diabetes Maternal Grandfather   . Cancer Paternal Grandmother   . Arthritis Mother   . Cancer - Prostate Father   . Gout Father     Past Medical History  Diagnosis Date  . Complication of anesthesia     BP and HR issues "they would not let me sleep because my heart rate would drop"  . Sinus congestion   . HA (headache)   . Optic nerve edema   . Injury of long thoracic nerve     right    Past Surgical History  Procedure Laterality Date  . No past surgeries      Current Outpatient Prescriptions  Medication Sig Dispense Refill  . acetaminophen (TYLENOL) 325 MG tablet Take 650 mg by mouth every 6 (six) hours as needed. Takes for headaches    . acetaZOLAMIDE (DIAMOX) 250 MG tablet Take 1 tablet (250 mg total) by mouth 3 (three) times daily. 90 tablet 11  . cetirizine (ZYRTEC) 10 MG tablet Take 10 mg by mouth daily.    . furosemide (LASIX) 20 MG tablet Take 1 tablet (20 mg total) by mouth daily. 30 tablet 11  . Prenatal Vit-Fe Fumarate-FA (PRENATAL MULTIVITAMIN) TABS Take 1 tablet by mouth every morning.    . venlafaxine (EFFEXOR) 75 MG tablet Take 75 mg by mouth daily.     No current facility-administered medications for this visit.    Allergies as of 10/01/2014  . (No Known Allergies)    Vitals: BP 137/90 mmHg  Pulse 100  Ht  (1.702 m)  Wt 253 lb  (114.76 kg)  BMI 39.62 kg/m2 Last Weight:  Wt Readings from Last 1 Encounters:  10/01/14 253 lb (114.76 kg)   Last Height:   Ht Readings from Last 1 Encounters:  10/01/14  (1.702 m)    Physical exam: Exam: Gen: NAD, conversant, well nourised, obese, well groomed                     CV: RRR, no MRG. No Carotid Bruits. No peripheral edema, warm, nontender Eyes: Conjunctivae clear without exudates or hemorrhage  Neuro: Detailed Neurologic Exam  Speech:    Speech is normal; fluent and spontaneous with normal comprehension.  Cognition:    The patient is oriented to person, place, and time;     recent and remote memory intact;     language fluent;     normal attention, concentration,     fund of knowledge Cranial Nerves:    The pupils are equal, round, and  reactive to light. Bilat papilledema. Visual fields are full to finger confrontation. Extraocular movements are intact. Trigeminal sensation is intact and the muscles of mastication are normal. The face is symmetric. The palate elevates in the midline. Hearing intact. Voice is normal. Shoulder shrug is normal. The tongue has normal motion without fasciculations.     Assessment/Plan: 39 year old female with IIH. Could not tolerate 500mg  bid diamox and is on 250mg  bid. She never increased to tid. Ophthalmology repeat eval shows improved hemorrhages but still pappiledema bilaterally  Will try and increase to diamox tid 250 again Will discontinue lasix  Addendum 06/29/2014: Notes from [facility] ophthalmology received June 17 2014 confirms bilateral optic disc edema with suspicion for papilledema ophthalmology suggested MRV and lumbar puncture. grade 1 papilledema bilaterally visual fields appeared intact  She saw [facility] ophthalmology: On 07/25/2014, Dr. Sinda Du. He stated the patient's uncorrected visual acuity was 20/30 in the right eye and 20 over 20 in the left, stable from her prior visit. Intraocular pressures  were 10 in each eye without relative affair and pupillary defect. Anterior segment examination unremarkable and posterior segment examination continues to demonstrate grade 2 papilledema in each eye with resolving hemorrhages. The remainder of the posterior segment all unremarkable. Assessment was that the patient has idiopathic intracranial hypertension was stable nerve elevation although resolving hemorrhages. Visual fields continue to be normal. Plan is to repeat her visual fields and photos in 8 weeks.  Naomie Dean, MD  [facility] [address] [place], [place] [address]  Phone [phone] Fax [phone]  A total of 15 minutes was spent face-to-face with this patient. Over half this time was spent on counseling patient on the IIH diagnosis and different diagnostic and therapeutic options available.

## 2015-01-01 ENCOUNTER — Telehealth: Payer: Self-pay | Admitting: *Deleted

## 2015-01-01 ENCOUNTER — Ambulatory Visit: Payer: 59 | Admitting: Neurology

## 2015-01-01 NOTE — Telephone Encounter (Signed)
no showed follow up appointment  

## 2015-01-05 ENCOUNTER — Encounter: Payer: Self-pay | Admitting: Neurology

## 2015-02-09 ENCOUNTER — Telehealth: Payer: Self-pay | Admitting: Neurology

## 2015-02-09 ENCOUNTER — Encounter: Payer: Self-pay | Admitting: Neurology

## 2015-02-09 ENCOUNTER — Ambulatory Visit (INDEPENDENT_AMBULATORY_CARE_PROVIDER_SITE_OTHER): Payer: 59 | Admitting: Neurology

## 2015-02-09 VITALS — BP 122/82 | HR 82 | Resp 20 | Ht 66.0 in | Wt 242.0 lb

## 2015-02-09 DIAGNOSIS — G932 Benign intracranial hypertension: Secondary | ICD-10-CM

## 2015-02-09 MED ORDER — ACETAZOLAMIDE ER 500 MG PO CP12: 1000.0000 mg | ORAL_CAPSULE | Freq: Three times a day (TID) | ORAL | Status: DC | PRN

## 2015-02-09 NOTE — Progress Notes (Signed)
[facility]    Provider:  Dr Lucia Gaskins Referring Provider: [name], MD Primary Care Physician:  [name], MD   Interval update 02/09/2015:  She is on the diamox  three times a day. She is following with Dr. Cathey Endow at [facility] ophthalmology. Visual fields have been intact. Hemorrhages on opthalmologic exam resolved. Still with mild optic head edema. She saw Dr. Cathey Endow 3 weeks ago. She is improved. Not having any headaches or any symptoms anymore. She is not having anymore side effects to the diamox since going up to  tid. Will try to increase further. will try to increase to  qday of the diamox daily.   Notes from [facility] last seen 11/14 state that her vision is 20/400 in the right and 20/20 in the left. No APD. Mild grade 1 optic disk edema. Visual fields normal in both eyes.   Interval update 720/2016:   She was having significant side effects from the Diamox, now the tingling in the fingers are mostly gone. The floaters in her vision are gone. No blurry vision. No transient visual obscurations. No headaches. Followed up with Dr. Cathey Endow, ophthalmologist recently and VF intact.   Interval update 07/28/2014:  Papilledema was confirmed by [facility] opthalmology, grade 1 papilledema bilaterally visual fields appeared intact. MRI brain and orbits and MRV c/w IIH. She was started on Diamox.Headaches are fine. She is on Diamox  twice daily. She reports fatigue during the day. She can fall asleep instantly during the day but always about 3pm. No apneic events per husband.   She saw [facility] ophthalmology: On 07/25/2014, Dr. Sinda Du. He stated the patient's uncorrected visual acuity was 20/30 in the right eye and 20 over 20 in the left, stable from her prior visit. Intraocular pressures were 10 in each eye without relative affair and pupillary defect. Anterior segment examination unremarkable and posterior segment examination  continues to demonstrate grade 2 papilledema in each eye with resolving hemorrhages. The remainder of the posterior segment all unremarkable. Assessment was that the patient has idiopathic intracranial hypertension was stable nerve elevation although resolving hemorrhages. Visual fields continue to be normal. Plan is to repeat her visual fields and photos in 8 weeks.   Initial visit 04/02/2013: Tammy Olsen is a 39 y.o. female here as a referral from Dr. Tenny Craw for Optic nerve edema.   She has been having some blurry vision at the end of the day or with straining on the computer and reading. She is having headaches in the middle of the night but hasn't had a headache in over a week. The headache is in the back of the head, throbbing, no light sensitiivy or sound sensitivity. She would have them a few times a week resolved with tylenol. The left eye vision is good (20/40) and the right eye she can only see light and moving objects which has been chronic since she was a child if uncorrected. No pain on eye movement. Headaches are not positional however she would get them when she is sleeping and in the recumbent position. Denies nausea/vomiting. No diplopia. No pulsatile hearing changes. No other focal neurologic symptoms.    MRi orbits IMPRESSION:  Equivocal MRI orbits (with and without) demonstrating: 1. Enlarged optic nerve sheaths and partially empty sella. These are non-specific findings, but can be seen in association with idiopathic intracranial hypertension.  2. No abnormal enhancing or compressive optic nerve lesions  MRV IMPRESSION:  Abnormal MRV head (without) demonstrating: 1. The right transverse sinus has  a possible focal stenosis (series 14 image 14). The transition from right transverse to sigmoid sinus has signal drop out (serires 14 image 7) and may be due to stenosis or artifact. The right internal jugular vein have no stenosis. These findings may be due to congential hypoplastic  changes, flow artifacts, or may be seen in association with idiopathic intracranial hypertension. Review/correlation of these findings with normal MRI brain findings recently make focal venous sinus thromboses unlikely.  2. Flow related artifact in the posterior inferior superior sagittal sinus. 3. Left sided venous drainage is unremarkable.  MRi brain IMPRESSION: Unremarkable MRI scan of the brain with and without contrast. Incidental changes of mild chronic paranasal sinusitis.  Review of Systems: Patient complains of symptoms per HPI as well as the following symptoms: no fever, no chills, no SOB, no CP. Pertinent negatives per HPI. All others negative.   Social History   Social History  . Marital Status: Married    Spouse Name: Tammy Olsen  . Number of Children: 2  . Years of Education: college   Occupational History  . Not on file.   Social History Main Topics  . Smoking status: Never Smoker   . Smokeless tobacco: Never Used  . Alcohol Use: No  . Drug Use: No  . Sexual Activity: Yes   Other Topics Concern  . Not on file   Social History Narrative   Patient lives at home with her husband [name](Anreian).   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   Education college.   Right handed.   Caffeine two cups daily.    Family History  Problem Relation Age of Onset  . Diabetes Maternal Grandmother   . Diabetes Maternal Grandfather   . Cancer Paternal Grandmother   . Arthritis Mother   . Cancer - Prostate Father   . Gout Father     Past Medical History  Diagnosis Date  . Complication of anesthesia     BP and HR issues "they would not let me sleep because my heart rate would drop"  . Sinus congestion   . HA (headache)   . Optic nerve edema   . Injury of long thoracic nerve     right    Past Surgical History  Procedure Laterality Date  . No past surgeries      Current Outpatient Prescriptions  Medication Sig Dispense Refill  . acetaminophen (TYLENOL) 325 MG tablet  Take 650 mg by mouth every 6 (six) hours as needed. Takes for headaches    . acetaZOLAMIDE (DIAMOX) 250 MG tablet Take 1 tablet (250 mg total) by mouth 3 (three) times daily. 90 tablet 11  . cetirizine (ZYRTEC) 10 MG tablet Take 10 mg by mouth daily.    . furosemide (LASIX) 20 MG tablet Take 1 tablet (20 mg total) by mouth daily. 30 tablet 11  . Prenatal Vit-Fe Fumarate-FA (PRENATAL MULTIVITAMIN) TABS Take 1 tablet by mouth every morning.    . venlafaxine (EFFEXOR) 75 MG tablet Take 75 mg by mouth daily.     No current facility-administered medications for this visit.    Allergies as of 02/09/2015  . (No Known Allergies)    Vitals: There were no vitals taken for this visit. Last Weight:  Wt Readings from Last 1 Encounters:  10/01/14 253 lb (114.76 kg)   Last Height:   Ht Readings from Last 1 Encounters:  10/01/14 5\' 7"  (1.702 m)     Cranial Nerves:  The pupils are equal, round, and reactive to light.  Bilat papilledema much improved. Visual fields are full to finger confrontation. Extraocular movements are intact. Trigeminal sensation is intact and the muscles of mastication are normal. The face is symmetric. The palate elevates in the midline. Hearing intact. Voice is normal. Shoulder shrug is normal. The tongue has normal motion without fasciculations.     Assessment/Plan: 39 year old female with IIH.  Ophthalmology repeat eval shows improved hemorrhages but still pappiledema bilaterally  Will try and increase to diamox again with a goal of 1000 mg daily.   Addendum 06/29/2014: Notes from [facility] ophthalmology received June 17 2014 confirms bilateral optic disc edema with suspicion for papilledema ophthalmology suggested MRV and lumbar puncture. grade 1 papilledema bilaterally visual fields appeared intact  She saw [facility] ophthalmology: On 07/25/2014, Dr. Sinda Du. He stated the patient's uncorrected visual acuity was 20/30 in the right eye and 20 over 20 in the  left, stable from her prior visit. Intraocular pressures were 10 in each eye without relative affair and pupillary defect. Anterior segment examination unremarkable and posterior segment examination continues to demonstrate grade 2 papilledema in each eye with resolving hemorrhages. The remainder of the posterior segment all unremarkable. Assessment was that the patient has idiopathic intracranial hypertension was stable nerve elevation although resolving hemorrhages. Visual fields continue to be normal. Plan is to repeat her visual fields and photos in 8 weeks.  Notes from [facility] last seen 11/14 state that her vision is 20/400 in the right and 20/20 in the left. No APD. Mild grade 1 optic disk edema. Visual fields normal in both eyes   Naomie Dean, MD  [facility] [address] [place], [place] [address]  Phone [phone] Fax [phone]  A total of 30 minutes was spent face-to-face with this patient. Over half this time was spent on counseling patient on the IIH diagnosis and different diagnostic and therapeutic options available.

## 2015-02-09 NOTE — Patient Instructions (Signed)
Overall you are doing fairly well but I do want to suggest a few things today:   Remember to drink plenty of fluid, eat healthy meals and do not skip any meals. Try to eat protein with a every meal and eat a healthy snack such as fruit or nuts in between meals. Try to keep a regular sleep-wake schedule and try to exercise daily, particularly in the form of walking, 20-30 minutes a day, if you can.   As far as your medications are concerned, I would like to suggest: Diamox 500mg  twice daily  As far as diagnostic testing: lab today  I would like to see you back in 3 months, sooner if we need to. Please call us with any interim questions, concerns, problems, updates or refill requests.   Please also call us for any test results so we can go over those with you on the phone.  My clinical assistant and will answer any of your questions and relay your messages to me and also relay most of my messages to you.   Our phone number is [phone]. We also have an after hours call service for urgent matters and there is a physician on-call for urgent questions. For any emergencies you know to call 911 or go to the nearest emergency room

## 2015-02-09 NOTE — Telephone Encounter (Signed)
Kara MeadEmma, Please let patient know that I received records from Dr. Cathey EndowBowen from her November appointment today.  He did still not some grade 1 optic disk edema but it is very mild and definitely greatly improved from initial visit. He also said that her visual fields were intact which is great news. This is great, we will continue to try and increase her diamox as we discussed in her appointment and I feel even the mild edema will improve. Thanks!

## 2015-02-10 NOTE — Telephone Encounter (Signed)
Called and spoke to pt. Relayed Dr Lucia GaskinsAhern message below. Pt verbalized understanding.

## 2015-02-11 ENCOUNTER — Telehealth: Payer: Self-pay | Admitting: Neurology

## 2015-02-11 NOTE — Telephone Encounter (Signed)
I called back.  Spoke with Huston FoleyBrady.  He was not able to assist and transferred me to [place].  They would like to clarify the dose change on Acetazolamide from 250mg  three times daily to 1000mg  three times daily.  Says they have to verify due to the large change in dose, and also ins will not pay for this high of a dose, stating it is "above recommended amount".  She is aware Dr Lucia GaskinsAhern is out of the office today.

## 2015-02-11 NOTE — Telephone Encounter (Signed)
Marchelle FolksAmanda with [facility] is calling to get [facility] for medication acetaZOLAMIDE (DIAMOX) 500 MG capsule  for the patient.  Thank you.

## 2015-02-12 NOTE — Telephone Encounter (Signed)
I tried to call, got no answer. I am titrating her slowly to a very large dose. But if insurance has a problem, can we change the script to 1000mg  twice daily? I would like to keep the 250mg  pill so she can titrate slowly by 250mg . Mind calling them back and seeing if we can get this lower dose approved?

## 2015-02-12 NOTE — Telephone Encounter (Signed)
I called back and spoke with pharmacist Victorino DikeJennifer.  Says med does not require a prior auth for higher dose, they just get a rejection saying "high dose is not covered".  She reprocessed the Rx for 1000mg  twice daily.  Ins did allow this to go through without any problems.  Please let me know if you need me to do anything else for this patient.  Thank you!

## 2015-02-12 NOTE — Telephone Encounter (Signed)
Of course!  I called the patient.  Got no answer.  Left message.

## 2015-02-12 NOTE — Telephone Encounter (Signed)
Thank you! Would you just let patient know please?

## 2015-05-12 ENCOUNTER — Ambulatory Visit (INDEPENDENT_AMBULATORY_CARE_PROVIDER_SITE_OTHER): Payer: 59 | Admitting: Internal Medicine

## 2015-05-12 ENCOUNTER — Encounter: Payer: Self-pay | Admitting: Internal Medicine

## 2015-05-12 VITALS — BP 122/76 | HR 98 | Temp 98.0°F | Resp 16 | Ht 66.0 in | Wt 242.0 lb

## 2015-05-12 DIAGNOSIS — J9801 Acute bronchospasm: Secondary | ICD-10-CM

## 2015-05-12 NOTE — Progress Notes (Signed)
  HPI  Patient presents to the office for evaluation of cough.  It has been going on for 2 weeks.  Patient reports night > day, dry, barky, worse with lying down.  They also endorse no other lingering symptoms.  .  They have tried antibiotics or inhalers: albuterol.  They report that nothing has worked.  They admits to other sick contacts.  She was treated at an UC and was given a zpak and also was given some prednisone.  She did use an albuterol inhaler once.    Review of Systems  Constitutional: Negative for fever, chills and malaise/fatigue.  HENT: Negative for congestion, ear pain and sore throat.   Respiratory: Positive for cough and shortness of breath. Negative for sputum production and wheezing.   Cardiovascular: Negative for chest pain, palpitations and leg swelling.  Neurological: Negative for headaches.    PE:  Filed Vitals:   05/12/15 0907  BP: 122/76  Pulse: 98  Temp: 98 F (36.7 C)  Resp: 16   General:  Alert and non-toxic, WDWN, NAD HEENT: NCAT, PERLA, EOM normal, no occular discharge or erythema.  Nasal mucosal edema with sinus tenderness to palpation.  Oropharynx clear with minimal oropharyngeal edema and erythema.  Mucous membranes moist and pink. Neck:  Cervical adenopathy Chest:  RRR no MRGs.  Lungs clear to auscultation A&P with no wheezes rhonchi or rales.   Abdomen: +BS x 4 quadrants, soft, non-tender, no guarding, rigidity, or rebound. Skin: warm and dry no rash Neuro: A&Ox4, CN II-XII grossly intact  Assessment and Plan:   1. Bronchospasm -breo sample -lungs clear -no abx needed -recommended continued allergy medication

## 2015-05-14 ENCOUNTER — Ambulatory Visit: Payer: 59 | Admitting: Neurology

## 2015-06-15 ENCOUNTER — Ambulatory Visit (INDEPENDENT_AMBULATORY_CARE_PROVIDER_SITE_OTHER): Payer: 59 | Admitting: Internal Medicine

## 2015-06-15 ENCOUNTER — Encounter: Payer: Self-pay | Admitting: Internal Medicine

## 2015-06-15 VITALS — BP 126/88 | Temp 98.2°F | Resp 16 | Ht 66.5 in | Wt 244.0 lb

## 2015-06-15 DIAGNOSIS — Z136 Encounter for screening for cardiovascular disorders: Secondary | ICD-10-CM

## 2015-06-15 DIAGNOSIS — Z1329 Encounter for screening for other suspected endocrine disorder: Secondary | ICD-10-CM

## 2015-06-15 DIAGNOSIS — Z0000 Encounter for general adult medical examination without abnormal findings: Secondary | ICD-10-CM | POA: Diagnosis not present

## 2015-06-15 DIAGNOSIS — E559 Vitamin D deficiency, unspecified: Secondary | ICD-10-CM

## 2015-06-15 DIAGNOSIS — K219 Gastro-esophageal reflux disease without esophagitis: Secondary | ICD-10-CM

## 2015-06-15 DIAGNOSIS — Z1389 Encounter for screening for other disorder: Secondary | ICD-10-CM

## 2015-06-15 DIAGNOSIS — Z13220 Encounter for screening for lipoid disorders: Secondary | ICD-10-CM

## 2015-06-15 DIAGNOSIS — Z131 Encounter for screening for diabetes mellitus: Secondary | ICD-10-CM

## 2015-06-15 DIAGNOSIS — Z130 Encounter for screening for diseases of the blood and blood-forming organs and certain disorders involving the immune mechanism: Secondary | ICD-10-CM

## 2015-06-15 LAB — CBC WITH DIFFERENTIAL/PLATELET
BASOS ABS: 81 {cells}/uL (ref 0–200)
Basophils Relative: 1 %
Eosinophils Absolute: 324 cells/uL (ref 15–500)
Eosinophils Relative: 4 %
HCT: 42.9 % (ref 35.0–45.0)
HEMOGLOBIN: 14.8 g/dL (ref 11.7–15.5)
LYMPHS ABS: 2592 {cells}/uL (ref 850–3900)
Lymphocytes Relative: 32 %
MCH: 29.8 pg (ref 27.0–33.0)
MCHC: 34.5 g/dL (ref 32.0–36.0)
MCV: 86.3 fL (ref 80.0–100.0)
MPV: 9.4 fL (ref 7.5–12.5)
Monocytes Absolute: 648 cells/uL (ref 200–950)
Monocytes Relative: 8 %
NEUTROS ABS: 4455 {cells}/uL (ref 1500–7800)
Neutrophils Relative %: 55 %
PLATELETS: 284 10*3/uL (ref 140–400)
RBC: 4.97 MIL/uL (ref 3.80–5.10)
RDW: 13.8 % (ref 11.0–15.0)
WBC: 8.1 10*3/uL (ref 3.8–10.8)

## 2015-06-15 LAB — VITAMIN B12: Vitamin B-12: 350 pg/mL (ref 200–1100)

## 2015-06-15 LAB — TSH: TSH: 1.57 mIU/L

## 2015-06-15 NOTE — Progress Notes (Signed)
Annual Screening Comprehensive Examination   This very nice 40 y.o.female presents for complete physical.  Patient has history of intracranial idiopathic hypertension with history of chronic headache and visual changes for the same.  Patient has not been seen by [place] chronically for some time.  She has been managed by Dr. Lucia Gaskins and [facility] who has her on Diamox BID with goal of 1,000 mg daily.  She is also followed by Dr. Cathey Endow at [facility].    She reports that she does tend to have some acid reflux.  She does have coffee in the morning and also tends to have some water during the day.  She wakes up in the night.     She has been seeing Dr. Cathey Endow regularly and just saw them yesterday.    She does see Dr. Tenny Craw.  She is her Obgyn and she is due to see her in June.  She has been up to date with her pap smears.  She has never had an abnormal pap smear.  She does not have periods.  She currently has an IUD.  No cramping, no abdominal pain.    Finally, patient has history of Vitamin D Deficiency and last vitamin D was No results found for: VD25OH.  Currently on supplementation     Current Outpatient Prescriptions on File Prior to Visit  Medication Sig Dispense Refill  . acetaminophen (TYLENOL) 325 MG tablet Take 650 mg by mouth every 6 (six) hours as needed. Takes for headaches    . acetaZOLAMIDE (DIAMOX) 500 MG capsule Take 2 capsules (1,000 mg total) by mouth 3 (three) times daily as needed. 540 capsule 3  . cetirizine (ZYRTEC) 10 MG tablet Take 10 mg by mouth daily.    Marland Kitchen venlafaxine (EFFEXOR) 75 MG tablet Take 75 mg by mouth daily.     No current facility-administered medications on file prior to visit.    No Known Allergies  Past Medical History  Diagnosis Date  . Complication of anesthesia     BP and HR issues "they would not let me sleep because my heart rate would drop"  . Sinus congestion   . HA (headache)   . Optic nerve edema   . Injury of long thoracic  nerve     right    Immunization History  Administered Date(s) Administered  . Tdap 06/02/2011    Past Surgical History  Procedure Laterality Date  . No past surgeries      Family History  Problem Relation Age of Onset  . Diabetes Maternal Grandmother   . Diabetes Maternal Grandfather   . Cancer Paternal Grandmother   . Arthritis Mother   . Cancer - Prostate Father   . Gout Father     Social History   Social History  . Marital Status: Married    Spouse Name: [name]  . Number of Children: 2  . Years of Education: college   Occupational History  . Not on file.   Social History Main Topics  . Smoking status: Never Smoker   . Smokeless tobacco: Never Used  . Alcohol Use: No  . Drug Use: No  . Sexual Activity: Yes   Other Topics Concern  . Not on file   Social History Narrative   Patient lives at home with her husband [name]).   Patient works full time Presenter, broadcasting.   Education college.   Right handed.   Caffeine two cups daily.    Review of Systems  Constitutional: Positive  for malaise/fatigue. Negative for fever and chills.  HENT: Negative for congestion, ear pain and sore throat.   Respiratory: Negative for cough, shortness of breath and wheezing.   Cardiovascular: Negative for chest pain, palpitations and leg swelling.  Gastrointestinal: Positive for heartburn. Negative for diarrhea, constipation, blood in stool and melena.  Genitourinary: Negative.   Neurological: Negative for dizziness, loss of consciousness and headaches.  Psychiatric/Behavioral: Negative for depression. The patient is nervous/anxious. The patient does not have insomnia.       Physical Exam  Ht 5' 6.5" (1.689 m)  Wt 244 lb (110.678 kg)  BMI 38.80 kg/m2  General Appearance: Well nourished and in no apparent distress. Eyes: PERRLA, EOMs, conjunctiva no swelling or erythema, normal fundi and vessels. Sinuses: No frontal/maxillary tenderness ENT/Mouth: EACs patent / TMs  nl.  Nares clear without erythema, swelling, mucoid exudates. Oral hygiene is good. No erythema, swelling, or exudate. Tongue normal, non-obstructing. Tonsils not swollen or erythematous. Hearing normal.  Neck: Supple, thyroid normal. No bruits, nodes or JVD. Respiratory: Respiratory effort normal.  BS equal and clear bilateral without rales, rhonci, wheezing or stridor. Cardio: Heart sounds are normal with regular rate and rhythm and no murmurs, rubs or gallops. Peripheral pulses are normal and equal bilaterally without edema. No aortic or femoral bruits. Chest: symmetric with normal excursions and percussion. Breasts: Symmetric, without lumps, nipple discharge, retractions, or fibrocystic changes.  Abdomen: Flat, soft, with bowl sounds. Nontender, no guarding, rebound, hernias, masses, or organomegaly.  Lymphatics: Non tender without lymphadenopathy.  Genitourinary: Deferred to Dr. Tenny Crawoss Musculoskeletal: Full ROM all peripheral extremities, joint stability, 5/5 strength, and normal gait. Skin: Warm and dry without rashes, lesions, cyanosis, clubbing or  ecchymosis.  Neuro: Cranial nerves intact, reflexes equal bilaterally. Normal muscle tone, no cerebellar symptoms. Sensation intact.  Pysch: Awake and oriented X 3, normal affect, Insight and Judgment appropriate.   Assessment and Plan   1. Routine general medical examination at a health care facility  - CBC with Differential/Platelet - BASIC METABOLIC PANEL WITH GFR - Hepatic function panel - Magnesium  2. Screening for hyperlipidemia  - Lipid panel  3. Screening for diabetes mellitus  - Hemoglobin A1c - Insulin, random  4. Screening for thyroid disorder  - TSH  5. Screening for deficiency anemia  - Iron and TIBC - Vitamin B12  6. Screening for hematuria or proteinuria  - Urinalysis, Routine w reflex microscopic ([facility]) - Microalbumin / creatinine urine ratio  7. Screening for cardiovascular condition  - EKG  12-Lead  8. Vitamin D deficiency  - VITAMIN D 25 Hydroxy (Vit-D Deficiency, Fractures)  9. Gastroesophageal reflux disease without esophagitis -ranitidine 300 mg BID prn for reflux  Continue prudent diet as discussed, weight control, regular exercise, and medications. Routine screening labs and tests as requested with regular follow-up as recommended.  Over 40 minutes of exam, counseling, chart review and critical decision making was performed

## 2015-06-15 NOTE — Patient Instructions (Signed)
Food Choices for Gastroesophageal Reflux Disease, Adult When you have gastroesophageal reflux disease (GERD), the foods you eat and your eating habits are very important. Choosing the right foods can help ease the discomfort of GERD. WHAT GENERAL GUIDELINES DO I NEED TO FOLLOW?  Choose fruits, vegetables, whole grains, low-fat dairy products, and low-fat meat, fish, and poultry.  Limit fats such as oils, salad dressings, butter, nuts, and avocado.  Keep a food diary to identify foods that cause symptoms.  Avoid foods that cause reflux. These may be different for different people.  Eat frequent small meals instead of three large meals each day.  Eat your meals slowly, in a relaxed setting.  Limit fried foods.  Cook foods using methods other than frying.  Avoid drinking alcohol.  Avoid drinking large amounts of liquids with your meals.  Avoid bending over or lying down until 2-3 hours after eating. WHAT FOODS ARE NOT RECOMMENDED? The following are some foods and drinks that may worsen your symptoms: Vegetables Tomatoes. Tomato juice. Tomato and spaghetti sauce. Chili peppers. Onion and garlic. Horseradish. Fruits Oranges, grapefruit, and lemon (fruit and juice). Meats High-fat meats, fish, and poultry. This includes hot dogs, ribs, ham, sausage, salami, and bacon. Dairy Whole milk and chocolate milk. Sour cream. Cream. Butter. Ice cream. Cream cheese.  Beverages Coffee and tea, with or without caffeine. Carbonated beverages or energy drinks. Condiments Hot sauce. Barbecue sauce.  Sweets/Desserts Chocolate and cocoa. Donuts. Peppermint and spearmint. Fats and Oils High-fat foods, including French fries and potato chips. Other Vinegar. Strong spices, such as black pepper, white pepper, red pepper, cayenne, curry powder, cloves, ginger, and chili powder. The items listed above may not be a complete list of foods and beverages to avoid. Contact your dietitian for more  information.   This information is not intended to replace advice given to you by your health care provider. Make sure you discuss any questions you have with your health care provider.   Document Released: 02/28/2005 Document Revised: 03/21/2014 Document Reviewed: 01/02/2013 Elsevier Interactive Patient Education 2016 Elsevier Inc.  

## 2015-06-16 LAB — MICROALBUMIN / CREATININE URINE RATIO
Creatinine, Urine: 115 mg/dL (ref 20–320)
MICROALB/CREAT RATIO: 4 ug/mg{creat} (ref <30)
Microalb, Ur: 0.5 mg/dL

## 2015-06-16 LAB — BASIC METABOLIC PANEL WITH GFR
BUN: 11 mg/dL (ref 7–25)
CHLORIDE: 107 mmol/L (ref 98–110)
CO2: 22 mmol/L (ref 20–31)
CREATININE: 0.74 mg/dL (ref 0.50–1.10)
Calcium: 8.8 mg/dL (ref 8.6–10.2)
GFR, Est African American: >89 mL/min (ref >=60)
Glucose, Bld: 83 mg/dL (ref 65–99)
POTASSIUM: 3.9 mmol/L (ref 3.5–5.3)
Sodium: 138 mmol/L (ref 135–146)

## 2015-06-16 LAB — URINALYSIS, ROUTINE W REFLEX MICROSCOPIC
Bilirubin Urine: NEGATIVE
GLUCOSE, UA: NEGATIVE
HGB URINE DIPSTICK: NEGATIVE
Ketones, ur: NEGATIVE
LEUKOCYTES UA: NEGATIVE
Nitrite: NEGATIVE
Protein, ur: NEGATIVE
Specific Gravity, Urine: 1.016 (ref 1.001–1.035)
pH: 6.5 (ref 5.0–8.0)

## 2015-06-16 LAB — LIPID PANEL
Cholesterol: 184 mg/dL (ref 125–200)
HDL: 29 mg/dL — AB (ref >=46)
LDL Cholesterol: 122 mg/dL (ref <130)
Total CHOL/HDL Ratio: 6.3 Ratio — ABNORMAL HIGH (ref <=5.0)
Triglycerides: 167 mg/dL — ABNORMAL HIGH (ref <150)
VLDL: 33 mg/dL — AB (ref <30)

## 2015-06-16 LAB — HEMOGLOBIN A1C
HEMOGLOBIN A1C: 5.1 % (ref <5.7)
Mean Plasma Glucose: 100 mg/dL

## 2015-06-16 LAB — VITAMIN D 25 HYDROXY (VIT D DEFICIENCY, FRACTURES): Vit D, 25-Hydroxy: 21 ng/mL — ABNORMAL LOW (ref 30–100)

## 2015-06-16 LAB — HEPATIC FUNCTION PANEL
ALK PHOS: 83 U/L (ref 33–115)
ALT: 11 U/L (ref 6–29)
AST: 15 U/L (ref 10–30)
Albumin: 4 g/dL (ref 3.6–5.1)
BILIRUBIN DIRECT: 0.1 mg/dL (ref <=0.2)
Indirect Bilirubin: 0.2 mg/dL (ref 0.2–1.2)
Total Bilirubin: 0.3 mg/dL (ref 0.2–1.2)
Total Protein: 6.4 g/dL (ref 6.1–8.1)

## 2015-06-16 LAB — IRON AND TIBC
%SAT: 22 % (ref 11–50)
IRON: 58 ug/dL (ref 40–190)
TIBC: 262 ug/dL (ref 250–450)
UIBC: 204 ug/dL (ref 125–400)

## 2015-06-16 LAB — INSULIN, RANDOM: Insulin: 33.2 u[IU]/mL — ABNORMAL HIGH (ref 2.0–19.6)

## 2015-06-16 LAB — MAGNESIUM: MAGNESIUM: 2 mg/dL (ref 1.5–2.5)

## 2015-06-17 ENCOUNTER — Telehealth: Payer: Self-pay | Admitting: *Deleted

## 2015-06-17 NOTE — Telephone Encounter (Signed)
Pt returned Emma's call °

## 2015-06-17 NOTE — Telephone Encounter (Signed)
LVM for pt to call back. Need to r/s if ok. Availability on 4/19. Dr Lucia GaskinsAhern would like to increase her Diamox in the meantime to 500mg  2x/day. She is currently taking 250mg  2x.day. I will speak to pt when she calls, thank you. Gave [facility] phone number.

## 2015-06-17 NOTE — Telephone Encounter (Signed)
Dr Lucia GaskinsAhern- please advise  Called pt back. R/s appt for 06/18/15 to 07/28/15 at 4pm. Offered earlier appt but pt out of town. Check in 345pm. She has no complaints at this time. Vision ok. She is already taking 500mg  2x/day. She had increased dose at last ov with Dr Lucia GaskinsAhern. She is doing well at this dose. She went and saw ophthalmologist last week who told her she still had same amount of swelling behind eye. Advised I will send Dr Lucia GaskinsAhern the message and we will call her back to advise. She verbalized understanding and appreciation.

## 2015-06-18 ENCOUNTER — Ambulatory Visit: Payer: 59 | Admitting: Neurology

## 2015-06-18 NOTE — Telephone Encounter (Signed)
Spoke to patient, she is taking 500 mg twice a day. She has been taking this higher dose or only a month so will hold here.  Thanks.

## 2015-07-03 ENCOUNTER — Encounter: Payer: Self-pay | Admitting: Internal Medicine

## 2015-07-28 ENCOUNTER — Encounter: Payer: Self-pay | Admitting: Neurology

## 2015-07-28 ENCOUNTER — Ambulatory Visit (INDEPENDENT_AMBULATORY_CARE_PROVIDER_SITE_OTHER): Payer: 59 | Admitting: Neurology

## 2015-07-28 VITALS — BP 146/98 | HR 104 | Ht 66.5 in | Wt 248.8 lb

## 2015-07-28 DIAGNOSIS — G932 Benign intracranial hypertension: Secondary | ICD-10-CM | POA: Diagnosis not present

## 2015-07-28 MED ORDER — TOPIRAMATE ER 200 MG PO CAP24: 200.0000 mg | ORAL_CAPSULE | Freq: Every day | ORAL | Status: DC

## 2015-07-28 NOTE — Progress Notes (Signed)
[facility]    Provider:  Dr Lucia Gaskins Referring Provider: [name], MD Primary Care Physician:  [name], MD  [facility]   Interval update 08/28/2015: She is on diamox  bid. She is doing well. Visual fields are stable. She still has papilledema. She is paying 300.month for the Diamox and wants to try something different. Patient reports is difficult for her to pay the $300 a month for the medication. Discussed that Topamax can also be a medication however it is not as strong of a carbonic anhydrase inhibitor and we frequently need much higher doses to achieve the same results. Also, this puts her at risk for vision loss of the Topamax does not work. Patient would still like to try something different due to the financial concerns of the high co-pay of Diamox.  Interval update 02/09/2015:  She is on the diamox  three times a day. She is following with Dr. Cathey Endow at [facility] ophthalmology. Visual fields have been intact. Hemorrhages on opthalmologic exam resolved. Still with mild optic head edema. She saw Dr. Cathey Endow 3 weeks ago. She is improved. Not having any headaches or any symptoms anymore. She is not having anymore side effects to the diamox since going up to  tid. Will try to increase further. will try to increase to  qday of the diamox daily.   Notes from [facility]hthalmology last seen 11/14 state that her vision is 20/400 in the right and 20/20 in the left. No APD. Mild grade 1 optic disk edema. Visual fields normal in both eyes.   Interval update 720/2016:   She was having significant side effects from the Diamox, now the tingling in the fingers are mostly gone. The floaters in her vision are gone. No blurry vision. No transient visual obscurations. No headaches. Followed up with Dr. Cathey Endow, ophthalmologist recently and VF intact.   Interval update 07/28/2014:  Papilledema was confirmed by [facility] opthalmology,  grade 1 papilledema bilaterally visual fields appeared intact. MRI brain and orbits and MRV c/w IIH. She was started on Diamox.Headaches are fine. She is on Diamox  twice daily. She reports fatigue during the day. She can fall asleep instantly during the day but always about 3pm. No apneic events per husband.   She saw [facility] ophthalmology: On 07/25/2014, Dr. Sinda Du. He stated the patient's uncorrected visual acuity was 20/30 in the right eye and 20 over 20 in the left, stable from her prior visit. Intraocular pressures were 10 in each eye without relative affair and pupillary defect. Anterior segment examination unremarkable and posterior segment examination continues to demonstrate grade 2 papilledema in each eye with resolving hemorrhages. The remainder of the posterior segment all unremarkable. Assessment was that the patient has idiopathic intracranial hypertension was stable nerve elevation although resolving hemorrhages. Visual fields continue to be normal. Plan is to repeat her visual fields and photos in 8 weeks.   Initial visit 04/02/2013: Tammy Olsen is a 40 y.o. female here as a referral from Dr. Tenny Craw for Optic nerve edema.   She has been having some blurry vision at the end of the day or with straining on the computer and reading. She is having headaches in the middle of the night but hasn't had a headache in over a week. The headache is in the back of the head, throbbing, no light sensitiivy or sound sensitivity. She would have them a few times a week resolved with tylenol. The left eye vision is good (20/40) and the  right eye she can only see light and moving objects which has been chronic since she was a child if uncorrected. No pain on eye movement. Headaches are not positional however she would get them when she is sleeping and in the recumbent position. Denies nausea/vomiting. No diplopia. No pulsatile hearing changes. No other focal neurologic symptoms.    MRi  orbits IMPRESSION:  Equivocal MRI orbits (with and without) demonstrating: 1. Enlarged optic nerve sheaths and partially empty sella. These are non-specific findings, but can be seen in association with idiopathic intracranial hypertension.  2. No abnormal enhancing or compressive optic nerve lesions  MRV IMPRESSION:  Abnormal MRV head (without) demonstrating: 1. The right transverse sinus has a possible focal stenosis (series 14 image 14). The transition from right transverse to sigmoid sinus has signal drop out (serires 14 image 7) and may be due to stenosis or artifact. The right internal jugular vein have no stenosis. These findings may be due to congential hypoplastic changes, flow artifacts, or may be seen in association with idiopathic intracranial hypertension. Review/correlation of these findings with normal MRI brain findings recently make focal venous sinus thromboses unlikely.  2. Flow related artifact in the posterior inferior superior sagittal sinus. 3. Left sided venous drainage is unremarkable.  MRi brain IMPRESSION: Unremarkable MRI scan of the brain with and without contrast. Incidental changes of mild chronic paranasal sinusitis.  Review of Systems: Patient complains of symptoms per HPI as well as the following symptoms: no fever, no chills, no SOB, no CP. Pertinent negatives per HPI. All others negative.   Social History   Social History  . Marital Status: Married    Spouse Name: [name]  . Number of Children: 2  . Years of Education: college   Occupational History  . Not on file.   Social History Main Topics  . Smoking status: Never Smoker   . Smokeless tobacco: Never Used  . Alcohol Use: No  . Drug Use: No  . Sexual Activity: Yes   Other Topics Concern  . Not on file   Social History Narrative   Patient lives at home with her husband [name]).   Patient works full time Presenter, broadcasting.   Education college.   Right handed.   Caffeine two cups  daily.    Family History  Problem Relation Age of Onset  . Diabetes Maternal Grandmother   . Diabetes Maternal Grandfather   . Cancer Paternal Grandmother   . Arthritis Mother   . Cancer - Prostate Father   . Gout Father     Past Medical History  Diagnosis Date  . Complication of anesthesia     BP and HR issues "they would not let me sleep because my heart rate would drop"  . Sinus congestion   . HA (headache)   . Optic nerve edema   . Injury of long thoracic nerve     right    Past Surgical History  Procedure Laterality Date  . No past surgeries      Current Outpatient Prescriptions  Medication Sig Dispense Refill  . acetaminophen (TYLENOL) 325 MG tablet Take 650 mg by mouth every 6 (six) hours as needed. Takes for headaches    . cetirizine (ZYRTEC) 10 MG tablet Take 10 mg by mouth daily.    Marland Kitchen venlafaxine (EFFEXOR) 75 MG tablet Take 75 mg by mouth daily.    . Topiramate ER (TROKENDI XR) 200 MG CP24 Take 200 mg by mouth at bedtime. 30 capsule 12  No current facility-administered medications for this visit.    Allergies as of 07/28/2015  . (No Known Allergies)    Vitals: BP 146/98 mmHg  Pulse 104  Ht 5' 6.5" (1.689 m)  Wt 248 lb 12.8 oz (112.855 kg)  BMI 39.56 kg/m2 Last Weight:  Wt Readings from Last 1 Encounters:  07/28/15 248 lb 12.8 oz (112.855 kg)   Last Height:   Ht Readings from Last 1 Encounters:  07/28/15 5' 6.5" (1.689 m)       Cranial Nerves:  The pupils are equal, round, and reactive to light. Bilat papilledema much improved, 1+. Visual fields are full to finger confrontation. Extraocular movements are intact. Trigeminal sensation is intact and the muscles of mastication are normal. The face is symmetric. The palate elevates in the midline. Hearing intact. Voice is normal. Shoulder shrug is normal. The tongue has normal motion without fasciculations.     Assessment/Plan: 40 year old female with IIH. Ophthalmology repeat eval shows  improved hemorrhages but still pappiledema bilaterally  Patient was on thousand milligrams of Diamox and doing well. However the Diamox is costing 100s of dollars a month which is financially putting a strain on her. She would like to try another medication. I described that Topamax has a similar mechanism however it is a much weaker carbonic anhydrase inhibitor. Often times we have to increase Topamax to a much higher dosage to get the same affects his Diamox, with increased side effects of Topamax. Also there is a risk of worsening papilledema and risk of vision loss. Patient would like to try it still. We'll start Trokendi 200 mg daily at bedtime and follow patient very closely. If she has any worsening vision she needs to go to the emergency room right away. If her symptoms start to recur she needs to call me so we can restart the Diamox.   Addendum 06/29/2014: [facility] June 17 2014 confirms bilateral optic disc edema with suspicion for papilledema ophthalmology suggested MRV and lumbar puncture. grade 1 papilledema bilaterally visual fields appeared intact  [facility]: On 07/25/2014, Dr. Sinda DuBradley Bowen. He stated the patient's uncorrected visual acuity was 20/30 in the right eye and 20 over 20 in the left, stable from her prior visit. Intraocular pressures were 10 in each eye without relative affair and pupillary defect. Anterior segment examination unremarkable and posterior segment examination continues to demonstrate grade 2 papilledema in each eye with resolving hemorrhages. The remainder of the posterior segment all unremarkable. Assessment was that the patient has idiopathic intracranial hypertension was stable nerve elevation although resolving hemorrhages. Visual fields continue to be normal. Plan is to repeat her visual fields and photos in 8 weeks.  [facility] last seen 11/14 state that her vision is 20/400 in  the right and 20/20 in the left. No APD. Mild grade 1 optic disk edema. Visual fields normal in both Heide GuileeyesAntonia Ahern, MD  [facility] [address] [place], [place] [address]  Phone [phone] Fax [phone]  A total of 30 minutes was spent face-to-face with this patient. Over half this time was spent on counseling patient on the IIH diagnosis and different diagnostic and therapeutic options available.

## 2015-07-28 NOTE — Patient Instructions (Signed)
Remember to drink plenty of fluid, eat healthy meals and do not skip any meals. Try to eat protein with a every meal and eat a healthy snack such as fruit or nuts in between meals. Try to keep a regular sleep-wake schedule and try to exercise daily, particularly in the form of walking, 20-30 minutes a day, if you can.   As far as your medications are concerned, I would like to suggest: Trokendi 100mg  at night then 200mg  at night  I would like to see you back in 8 weeks, sooner if we need to. Please call us with any interim questions, concerns, problems, updates or refill requests.   Our phone number is [phone]. We also have an after hours call service for urgent matters and there is a physician on-call for urgent questions. For any emergencies you know to call 911 or go to the nearest emergency room

## 2015-08-24 ENCOUNTER — Encounter: Payer: Self-pay | Admitting: Neurology

## 2015-08-25 ENCOUNTER — Other Ambulatory Visit: Payer: Self-pay | Admitting: Neurology

## 2015-08-25 ENCOUNTER — Encounter: Payer: Self-pay | Admitting: *Deleted

## 2015-08-25 MED ORDER — PHENTERMINE HCL 37.5 MG PO CAPS
37.5000 mg | ORAL_CAPSULE | ORAL | Status: DC
Start: 2015-08-25 — End: 2016-02-29

## 2015-08-25 NOTE — Progress Notes (Signed)
Faxed printed rx phentermine to [phone] Dr Lucia GaskinsAhern request. Received fax confirmation.

## 2015-10-28 ENCOUNTER — Encounter: Payer: Self-pay | Admitting: Neurology

## 2015-10-28 ENCOUNTER — Ambulatory Visit (INDEPENDENT_AMBULATORY_CARE_PROVIDER_SITE_OTHER): Payer: 59 | Admitting: Neurology

## 2015-10-28 VITALS — BP 123/78 | HR 77 | Ht 66.5 in | Wt 241.4 lb

## 2015-10-28 DIAGNOSIS — G932 Benign intracranial hypertension: Secondary | ICD-10-CM

## 2015-10-28 NOTE — Progress Notes (Signed)
[facility]    Provider:  Dr Lucia Gaskins Referring Provider: [name], MD Primary Care Physician:  [name], MD   Interval history 10/28/2015:  Tammy Olsen stopped the phentermine due to tachycardia. Tammy Olsen is trying to lose weight and exercise. Tammy Olsen is training and going to the gym 4 days/week. Tammy Olsen has lost 11 pounds and inches. Tammy Olsen is going to a gym near [employer] and has a Psychologist, educational. Tammy Olsen went off of the diamox for alittle bit a full week and noticed her symptoms come back which included vision changes and spots never had significant headaches. Tammy Olsen would like to see a new ophthalmologist, Will refer to chris groat.   Interval update 08/28/2015: Tammy Olsen is on diamox 500mg  bid. Tammy Olsen is doing well. Visual fields are stable. Tammy Olsen still has papilledema. Tammy Olsen is paying 300.month for the Diamox and wants to try something different. Tammy Olsen reports is difficult for her to pay the $300 a month for the medication. Discussed that Topamax can also be a medication however it is not as strong of a carbonic anhydrase inhibitor and we frequently need much higher doses to achieve the same results. Also, this puts her at risk for vision loss of the Topamax does not work. Tammy Olsen would still like to try something different due to the financial concerns of the high co-pay of Diamox.  Interval update 02/09/2015:  Tammy Olsen is on the diamox 250mg  three times a day. Tammy Olsen is following with Dr. Cathey Endow at [facility] ophthalmology. Visual fields have been intact. Hemorrhages on opthalmologic exam resolved. Still with mild optic head edema. Tammy Olsen saw Dr. Cathey Endow 3 weeks ago. Tammy Olsen is improved. Not having any headaches or any symptoms anymore. Tammy Olsen is not having anymore side effects to the diamox since going up to 250mg  tid. Will try to increase further. will try to increase to 1000mg  qday of the diamox daily.   Notes from [facility] last seen 11/14 state that her vision is 20/400 in the right and 20/20 in the left. No APD.  Mild grade 1 optic disk edema. Visual fields normal in both eyes.   Interval update 720/2016:   Tammy Olsen was having significant side effects from the Diamox, now the tingling in the fingers are mostly gone. The floaters in her vision are gone. No blurry vision. No transient visual obscurations. No headaches. Followed up with Dr. Cathey Endow, ophthalmologist recently and VF intact.   Interval update 07/28/2014:  Papilledema was confirmed by [facility] opthalmology, grade 1 papilledema bilaterally visual fields appeared intact. MRI brain and orbits and MRV c/w IIH. Tammy Olsen was started on Diamox.Headaches are fine. Tammy Olsen is on Diamox 250mg  twice daily. Tammy Olsen reports fatigue during the day. Tammy Olsen can fall asleep instantly during the day but always about 3pm. No apneic events per husband.   Tammy Olsen saw [facility] ophthalmology: On 07/25/2014, Dr. Sinda Du. He stated the Tammy Olsen's uncorrected visual acuity was 20/30 in the right eye and 20 over 20 in the left, stable from her prior visit. Intraocular pressures were 10 in each eye without relative affair and pupillary defect. Anterior segment examination unremarkable and posterior segment examination continues to demonstrate grade 2 papilledema in each eye with resolving hemorrhages. The remainder of the posterior segment all unremarkable. Assessment was that the Tammy Olsen has idiopathic intracranial hypertension was stable nerve elevation although resolving hemorrhages. Visual fields continue to be normal. Plan is to repeat her visual fields and photos in 8 weeks.   Initial visit 04/02/2013: Tammy Olsen is a 40 y.o. female here as a  referral from Dr. Tenny Crawoss for Optic nerve edema.   Tammy Olsen has been having some blurry vision at the end of the day or with straining on the computer and reading. Tammy Olsen is having headaches in the middle of the night but hasn't had a headache in over a week. The headache is in the back of the head, throbbing, no light sensitiivy or sound  sensitivity. Tammy Olsen would have them a few times a week resolved with tylenol. The left eye vision is good (20/40) and the right eye Tammy Olsen can only see light and moving objects which has been chronic since Tammy Olsen was a child if uncorrected. No pain on eye movement. Headaches are not positional however Tammy Olsen would get them when Tammy Olsen is sleeping and in the recumbent position. Denies nausea/vomiting. No diplopia. No pulsatile hearing changes. No other focal neurologic symptoms.    MRi orbits IMPRESSION:  Equivocal MRI orbits (with and without) demonstrating: 1. Enlarged optic nerve sheaths and partially empty sella. These are non-specific findings, but can be seen in association with idiopathic intracranial hypertension.  2. No abnormal enhancing or compressive optic nerve lesions  MRV IMPRESSION:  Abnormal MRV head (without) demonstrating: 1. The right transverse sinus has a possible focal stenosis (series 14 image 14). The transition from right transverse to sigmoid sinus has signal drop out (serires 14 image 7) and may be due to stenosis or artifact. The right internal jugular vein have no stenosis. These findings may be due to congential hypoplastic changes, flow artifacts, or may be seen in association with idiopathic intracranial hypertension. Review/correlation of these findings with normal MRI brain findings recently make focal venous sinus thromboses unlikely.  2. Flow related artifact in the posterior inferior superior sagittal sinus. 3. Left sided venous drainage is unremarkable.  MRi brain IMPRESSION: Unremarkable MRI scan of the brain with and without contrast. Incidental changes of mild chronic paranasal sinusitis.  Review of Systems: Tammy Olsen complains of symptoms per HPI as well as the following symptoms: no fever, no chills, no SOB, no CP. Pertinent negatives per HPI. All others negative.   Social History   Social History  . Marital status: Married    Spouse name: Koleen Nimroddrian  . Number  of children: 2  . Years of education: college   Occupational History  . Not on file.   Social History Main Topics  . Smoking status: Never Smoker  . Smokeless tobacco: Never Used  . Alcohol use No  . Drug use: No  . Sexual activity: Yes   Other Topics Concern  . Not on file   Social History Narrative   Tammy Olsen lives at home with her husband [name](Anreian).   Tammy Olsen works full time Presenter, broadcastingHuman resources.   Education college.   Right handed.   Caffeine two cups daily.    Family History  Problem Relation Age of Onset  . Diabetes Maternal Grandmother   . Diabetes Maternal Grandfather   . Cancer Paternal Grandmother   . Arthritis Mother   . Cancer - Prostate Father   . Gout Father     Past Medical History:  Diagnosis Date  . Complication of anesthesia    BP and HR issues "they would not let me sleep because my heart rate would drop"  . HA (headache)   . Injury of long thoracic nerve    right  . Optic nerve edema   . Sinus congestion     Past Surgical History:  Procedure Laterality Date  . NO PAST SURGERIES  Current Outpatient Prescriptions  Medication Sig Dispense Refill  . acetaminophen (TYLENOL) 325 MG tablet Take 650 mg by mouth every 6 (six) hours as needed. Takes for headaches    . cetirizine (ZYRTEC) 10 MG tablet Take 10 mg by mouth daily.    . phentermine 37.5 MG capsule Take 1 capsule (37.5 mg total) by mouth every morning. 30 capsule 5  . Topiramate ER (TROKENDI XR) 200 MG CP24 Take 200 mg by mouth at bedtime. 30 capsule 12  . venlafaxine (EFFEXOR) 75 MG tablet Take 75 mg by mouth daily.     No current facility-administered medications for this visit.     Allergies as of 10/28/2015  . (No Known Allergies)    Vitals: There were no vitals taken for this visit. Last Weight:  Wt Readings from Last 1 Encounters:  07/28/15 248 lb 12.8 oz (112.9 kg)   Last Height:   Ht Readings from Last 1 Encounters:  07/28/15 5' 6.5" (1.689 m)    Cranial  Nerves:  The pupils are equal, round, and reactive to light. Bilat papilledema much improved, 1+ right > left. Visual fields are full to finger confrontation. Extraocular movements are intact. Trigeminal sensation is intact and the muscles of mastication are normal. The face is symmetric. The palate elevates in the midline. Hearing intact. Voice is normal. Shoulder shrug is normal. The tongue has normal motion without fasciculations.     Assessment/Plan: 40 year old female with IIH. Initial exam showed papilledema +2 and bilateral hemorrhages.  MRI showed enlarged optic nerve sheaths and partially empty sella. LP opening pressure only 21. Symptoms improved on diamox.   Tammy Olsen was on thousand milligrams of Diamox and doing well. However the Diamox is costing 100s of dollars a month which is financially putting a strain on her. Tammy Olsen would like to try another medication. I described that Topamax has a similar mechanism however it is a much weaker carbonic anhydrase inhibitor. Often times we have to increase Topamax to a much higher dosage to get the same affects his Diamox, with increased side effects of Topamax. Also there is a risk of worsening papilledema and risk of vision loss. Tammy Olsen would like to try it still. We'll start Trokendi with a goal dose of 200 mg daily at bedtime and follow Tammy Olsen very closely. If Tammy Olsen has any worsening vision Tammy Olsen needs to go to the emergency room right away. If her symptoms start to recur Tammy Olsen needs to call me so we can restart the Diamox.  Tammy Olsen would like to see a different ophthalmologist. Will refer to Dr. Sallye Lat.    Addendum 06/29/2014: Notes from [facility] ophthalmology received June 17 2014 confirms bilateral optic disc edema with suspicion for papilledema ophthalmology suggested MRV and lumbar puncture. grade 1 papilledema bilaterally visual fields appeared intact  Tammy Olsen saw [facility] ophthalmology: On 07/25/2014, Dr. Sinda Du. He stated  the Tammy Olsen's uncorrected visual acuity was 20/30 in the right eye and 20 over 20 in the left, stable from her prior visit. Intraocular pressures were 10 in each eye without relative affair and pupillary defect. Anterior segment examination unremarkable and posterior segment examination continues to demonstrate grade 2 papilledema in each eye with resolving hemorrhages. The remainder of the posterior segment all unremarkable. Assessment was that the Tammy Olsen has idiopathic intracranial hypertension was stable nerve elevation although resolving hemorrhages. Visual fields continue to be normal. Plan is to repeat her visual fields and photos in 8 weeks.  Notes from [facility]mology last seen 11/14 state  that her vision is 20/400 in the right and 20/20 in the left. No APD. Mild grade 1 optic disk edema. Visual fields normal in both eyes  Had a long discussion about weight loss and its role in IIH. Hopefully when Tammy Olsen is at a normal BMI we will be able to discontinue diamox or topamax.  Naomie DeanAntonia Ahern, MD  [facility] [address] [place], [place] [id number]  Phone [phone] Fax [phone]  A total of 30 minutes was spent face-to-face with this Tammy Olsen. Over half this time was spent on counseling Tammy Olsen on the IIH diagnosis and different diagnostic and therapeutic options available.

## 2015-10-28 NOTE — Patient Instructions (Signed)
Remember to drink plenty of fluid, eat healthy meals and do not skip any meals. Try to eat protein with a every meal and eat a healthy snack such as fruit or nuts in between meals. Try to keep a regular sleep-wake schedule and try to exercise daily, particularly in the form of walking, 20-30 minutes a day, if you can.   I would like to see you back in 3-4 months, sooner if we need to. Please call us with any interim questions, concerns, problems, updates or refill requests.   Our phone number is [phone]. We also have an after hours call service for urgent matters and there is a physician on-call for urgent questions. For any emergencies you know to call 911 or go to the nearest emergency room   

## 2015-12-07 ENCOUNTER — Telehealth: Payer: Self-pay | Admitting: Neurology

## 2015-12-07 NOTE — Telephone Encounter (Signed)
Patient called regarding medication (she can't remember the name of), states she is completely out of this medication and pharmacy won't fill. Please call [phone].

## 2015-12-07 NOTE — Telephone Encounter (Addendum)
Called patient. She ran out of rx topiramate XR samples last Tuesday. She tried to get rx filled at pharmacy on Wednesday, but it was going to cost her over 700 dollars.  I recommended patient call ahead of time next time before she runs out of medication.  Copay card only brought it down to 400 dollars. Advised patient I will call pharmacy and see what happened. She should receive rx $0 for the next 12 prescriptions.  Called pt pharmacy. Spoke with April (tech). She transferred me to pharmacist, brady. I explained process and that patient should be able to get rx for 0 dollars. I gave him [facility] number to call [phone]. This way they can walk him through process. Huston FoleyBrady verbalized understanding and is going to call. He will call back and let us know. He took my name and number.

## 2015-12-08 NOTE — Telephone Encounter (Signed)
Dr Lucia GaskinsAhern- FYI only  Called and spoke to patient. Advised rx ready to be picked up from pharmacy. Advised I spoke to April today at pharmacy and she advised VM was left for her that rx ready to be picked up. Advised copay $0. She verbalized understanding.

## 2015-12-08 NOTE — Telephone Encounter (Signed)
Tried calling pt pharmacy to check on status of medication. Pharmacy does not open until 9am. Will try calling again once pharmacy open.

## 2015-12-08 NOTE — Telephone Encounter (Signed)
Called pt pharmacy again since no call back with update. I spoke with April. She stated Huston FoleyBrady not working today. She stated he was able to make a couple phone calls and was able to get copay down to $0 for patient. He called and LVM for pt to let her know. Has not heard back. Advised I will also try calling the patient. She verbalized understanding.

## 2015-12-21 ENCOUNTER — Other Ambulatory Visit: Payer: Self-pay | Admitting: Obstetrics and Gynecology

## 2015-12-22 LAB — CYTOLOGY - PAP

## 2016-02-03 ENCOUNTER — Telehealth: Payer: Self-pay | Admitting: Neurology

## 2016-02-03 DIAGNOSIS — G932 Benign intracranial hypertension: Secondary | ICD-10-CM

## 2016-02-03 NOTE — Telephone Encounter (Signed)
Kara MeadEmma, I was going to refer patient to dr. Dione BoozeGroat for ophthalmology instead of her current doctor. Would she still like to go? Please discuss with patient and let me know please, I have had great feedback about dr. Dione BoozeGroat thanks

## 2016-02-08 NOTE — Telephone Encounter (Signed)
Placed referral to Dr Dione BoozeGroat per AA,MD request.

## 2016-02-08 NOTE — Telephone Encounter (Signed)
Yes please refer for IIH (pseudotumor cerebri)

## 2016-02-08 NOTE — Telephone Encounter (Signed)
Called and spoke to pt. She is agreeable to be referred to Dr Dione BoozeGroat instead. Advised we will place referral today and she should be called to schedule within 1-2 weeks. If she does not hear to schedule, advised her to call back. She verbalized understanding.

## 2016-02-08 NOTE — Addendum Note (Signed)
Addended by: Hillis RangeKING, Daryn Hicks L on: 02/08/2016 01:22 PM   Modules accepted: Orders

## 2016-02-29 ENCOUNTER — Encounter: Payer: Self-pay | Admitting: Neurology

## 2016-02-29 ENCOUNTER — Telehealth: Payer: Self-pay | Admitting: Neurology

## 2016-02-29 ENCOUNTER — Ambulatory Visit (INDEPENDENT_AMBULATORY_CARE_PROVIDER_SITE_OTHER): Payer: 59 | Admitting: Neurology

## 2016-02-29 VITALS — BP 110/82 | HR 79 | Ht 66.5 in | Wt 229.2 lb

## 2016-02-29 DIAGNOSIS — G932 Benign intracranial hypertension: Secondary | ICD-10-CM

## 2016-02-29 MED ORDER — TOPIRAMATE ER 200 MG PO SPRINKLE CAP24: 200.0000 mg | EXTENDED_RELEASE_CAPSULE | Freq: Every day | ORAL | 12 refills | Status: DC

## 2016-02-29 NOTE — Telephone Encounter (Signed)
Noted, thank you Dana.  °

## 2016-02-29 NOTE — Patient Instructions (Addendum)
Remember to drink plenty of fluid, eat healthy meals and do not skip any meals. Try to eat protein with a every meal and eat a healthy snack such as fruit or nuts in between meals. Try to keep a regular sleep-wake schedule and try to exercise daily, particularly in the form of walking, 20-30 minutes a day, if you can.   As far as your medications are concerned, I would like to suggest: Qudexy 200mg  at night  As far as diagnostic testing: Dr. Dione BoozeGroat appointment  I would like to see you back in 6 months, sooner if we need to. Please call us with any interim questions, concerns, problems, updates or refill requests.   Our phone number is [phone]. We also have an after hours call service for urgent matters and there is a physician on-call for urgent questions. For any emergencies you know to call 911 or go to the nearest emergency room

## 2016-02-29 NOTE — Telephone Encounter (Signed)
Patient was here at the office.  Patient is scheduled with Dr. Sallye Lathristopher Groat Jan. 22 2018 at 8:00 . Patient is on CX list  am telephone [phone] fax [phone] . Patient was also given number to [facility] [phone]. Relayed to Patient she has start  process. Patient under stood all details.

## 2016-02-29 NOTE — Progress Notes (Signed)
[name]   Provider:  Dr Tammy Olsen Referring Provider: [name], MD Primary Care Physician:  Tammy Olsen., MD   Interval history 02/29/2016:  40 year old female with IIH. Initial exam showed papilledema +2 and bilateral hemorrhages.  MRI showed enlarged optic nerve sheaths and partially empty sella. LP opening pressure only 21 but symptoms improved on diamox. She doesn't feel well. Her left eye is feeling fuzzy. She has been on Topiramate 200mg  for 7 weeks. Having some vision changes. Only at night, maybe she is tired. She has haziness of vision. Advised to take out the Mirena IUD. She feels tired, fatigued. She is down to 225, lost 25 pounds.  .  Interval history 10/28/2015:  She stopped the phentermine due to tachycardia. She is trying to lose weight and exercise. She is training and going to the gym 4 days/week. She has lost 11 pounds and inches. She is going to a gym near [employer] and has a Psychologist, educational. She went off of the diamox for alittle bit a full week and noticed her symptoms come back which included vision changes and spots never had significant headaches. She would like to see a new ophthalmologist, Will refer to Tammy Olsen.   Interval update 08/28/2015: She is on diamox 500mg  bid. She is doing well. Visual fields are stable. She still has papilledema. She is paying 300.month for the Diamox and wants to try something different. Patient reports is difficult for her to pay the $300 a month for the medication. Discussed that Topamax can also be a medication however it is not as strong of a carbonic anhydrase inhibitor and we frequently need much higher doses to achieve the same results. Also, this puts her at risk for vision loss of the Topamax does not work. Patient would still like to try something different due to the financial concerns of the high co-pay of Diamox.  Interval update 02/09/2015:  She is on the diamox 250mg  three times a day. She is following with Dr.  Cathey Olsen at [facility] ophthalmology. Visual fields have been intact. Hemorrhages on opthalmologic exam resolved. Still with mild optic head edema. She saw Dr. Cathey Olsen 3 weeks ago. She is improved. Not having any headaches or any symptoms anymore. She is not having anymore side effects to the diamox since going up to 250mg  tid. Will try to increase further. will try to increase to 1000mg  qday of the diamox daily.   Notes from [facility] last seen 11/14 state that her vision is 20/400 in the right and 20/20 in the left. No APD. Mild grade 1 optic disk edema. Visual fields normal in both eyes.   Interval update 720/2016:   She was having significant side effects from the Diamox, now the tingling in the fingers are mostly gone. The floaters in her vision are gone. No blurry vision. No transient visual obscurations. No headaches. Followed up with Dr. Cathey Olsen, ophthalmologist recently and VF intact.   Interval update 07/28/2014:  Papilledema was confirmed by [facility] opthalmology, grade 1 papilledema bilaterally visual fields appeared intact. MRI brain and orbits and MRV c/w IIH. She was started on Diamox.Headaches are fine. She is on Diamox 250mg  twice daily. She reports fatigue during the day. She can fall asleep instantly during the day but always about 3pm. No apneic events per husband.   She saw [facility] ophthalmology: On 07/25/2014, Dr. Sinda Olsen. He stated the patient's uncorrected visual acuity was 20/30 in the right eye and 20 over 20 in the left, stable  from her prior visit. Intraocular pressures were 10 in each eye without relative affair and pupillary defect. Anterior segment examination unremarkable and posterior segment examination continues to demonstrate grade 2 papilledema in each eye with resolving hemorrhages. The remainder of the posterior segment all unremarkable. Assessment was that the patient has idiopathic intracranial hypertension was stable nerve elevation  although resolving hemorrhages. Visual fields continue to be normal. Plan is to repeat her visual fields and photos in 8 weeks.   Initial visit 04/02/2013: Tammy Olsen is a 40 y.o. female here as a referral from Dr. Tenny Olsen for Optic nerve edema.   She has been having some blurry vision at the end of the day or with straining on the computer and reading. She is having headaches in the middle of the night but hasn't had a headache in over a week. The headache is in the back of the head, throbbing, no light sensitiivy or sound sensitivity. She would have them a few times a week resolved with tylenol. The left eye vision is good (20/40) and the right eye she can only see light and moving objects which has been chronic since she was a child if uncorrected. No pain on eye movement. Headaches are not positional however she would get them when she is sleeping and in the recumbent position. Denies nausea/vomiting. No diplopia. No pulsatile hearing changes. No other focal neurologic symptoms.    MRi orbits IMPRESSION:  Equivocal MRI orbits (with and without) demonstrating: 1. Enlarged optic nerve sheaths and partially empty sella. These are non-specific findings, but can be seen in association with idiopathic intracranial hypertension.  2. No abnormal enhancing or compressive optic nerve lesions  MRV IMPRESSION:  Abnormal MRV head (without) demonstrating: 1. The right transverse sinus has a possible focal stenosis (series 14 image 14). The transition from right transverse to sigmoid sinus has signal drop out (serires 14 image 7) and may be due to stenosis or artifact. The right internal jugular vein have no stenosis. These findings may be due to congential hypoplastic changes, flow artifacts, or may be seen in association with idiopathic intracranial hypertension. Review/correlation of these findings with normal MRI brain findings recently make focal venous sinus thromboses unlikely.  2. Flow  related artifact in the posterior inferior superior sagittal sinus. 3. Left sided venous drainage is unremarkable.  MRi brain IMPRESSION: Unremarkable MRI scan of the brain with and without contrast. Incidental changes of mild chronic paranasal sinusitis.  Review of Systems: Patient complains of symptoms per HPI as well as the following symptoms: no fever, no chills, no SOB, no CP. Pertinent negatives per HPI. All others negative.   Social History   Social History  . Marital status: Married    Spouse name: [name]  . Number of children: 2  . Years of education: college   Occupational History  . Not on file.   Social History Main Topics  . Smoking status: Never Smoker  . Smokeless tobacco: Never Used  . Alcohol use No  . Drug use: No  . Sexual activity: Yes   Other Topics Concern  . Not on file   Social History Narrative   Patient lives at home with her husband [name]).   Patient works full time Presenter, broadcasting.   Education college.   Right handed.   Caffeine two cups daily.    Family History  Problem Relation Age of Onset  . Arthritis Mother   . Cancer - Prostate Father   . Gout Father   .  Diabetes Maternal Grandmother   . Diabetes Maternal Grandfather   . Cancer Paternal Grandmother     Past Medical History:  Diagnosis Date  . Complication of anesthesia    BP and HR issues "they would not let me sleep because my heart rate would drop"  . HA (headache)   . Injury of long thoracic nerve    right  . Optic nerve edema   . Sinus congestion     Past Surgical History:  Procedure Laterality Date  . NO PAST SURGERIES      Current Outpatient Prescriptions  Medication Sig Dispense Refill  . cetirizine (ZYRTEC) 10 MG tablet Take 10 mg by mouth daily.    . Topiramate ER (TROKENDI XR) 200 MG CP24 Take 200 mg by mouth at bedtime. 30 capsule 12  . venlafaxine (EFFEXOR) 75 MG tablet Take 75 mg by mouth daily.     No current facility-administered medications  for this visit.     Allergies as of 02/29/2016  . (No Known Allergies)    Vitals: BP 110/82 (BP Location: Right Arm, Patient Position: Sitting, Cuff Size: Large)   Pulse 79   Ht 5' 6.5" (1.689 m)   Wt 229 lb 3.2 oz (104 kg)   SpO2 98%   BMI 36.44 kg/m  Last Weight:  Wt Readings from Last 1 Encounters:  02/29/16 229 lb 3.2 oz (104 kg)   Last Height:   Ht Readings from Last 1 Encounters:  02/29/16 5' 6.5" (1.689 m)     Cranial Nerves:  The pupils are equal, round, and reactive to light. Bilat papilledema much improved, mild right > left. Visual fields are full to finger confrontation. Extraocular movements are intact. Trigeminal sensation is intact and the muscles of mastication are normal. The face is symmetric. The palate elevates in the midline. Hearing intact. Voice is normal. Shoulder shrug is normal. The tongue has normal motion without fasciculations.     Assessment/Plan: 40 year old female with IIH. Initial exam showed papilledema +2 and bilateral hemorrhages.  MRI showed enlarged optic nerve sheaths and partially empty sella. LP opening pressure only 21. Symptoms improved on diamox.   Patient was on a thousand milligrams of Diamox and doing well. However the Diamox was costing 100s of dollars a month which was financially putting a strain on her. Topamax has a similar mechanism however it is a much weaker carbonic anhydrase inhibitor. Often times we have to increase Topamax to a much higher dosage to get the same affects as Diamox, with increased side effects of Topamax. Also there is a risk of worsening papilledema and risk of vision loss. Patient has been on Trokendi 200 mg daily at bedtime.  She would like to see a different ophthalmologist. Will refer to Dr. Sallye Lathristopher Olsen. If his exam shows worsened abnormalities need to restart Diamox or increase Topiramate.   Addendum 06/29/2014: [facility] 17 2014 confirms bilateral  optic disc edema with suspicion for papilledema ophthalmology suggested MRV and lumbar puncture. grade 1 papilledema bilaterally visual fields appeared intact  [facility]: On 07/25/2014, Dr. Sinda DuBradley Bowen. Intraocular pressures were 10 in each eye without relative APD. Anterior segment examination unremarkable and posterior segment examination continues to demonstrate grade 2 papilledema in each eye with resolving hemorrhages. The remainder of the posterior segment all unremarkable. Assessment was that the patient has idiopathic intracranial hypertension was stable nerve elevation although resolving hemorrhages. Visual fields continue to be normal. Plan is to repeat her  visual fields and photos in 8 weeks.  Notes from [facility] last seen 01/26/2015 state that her vision is 20/400 in the right and 20/20 in the left. No APD. Mild grade 1 optic disk edema. Visual fields normal in both eyes  Had a long discussion about weight loss and its role in IIH. Hopefully when she is at a normal BMI we will be able to discontinue diamox or topamax.  Tammy DeanAntonia Kerin Cecchi, MD  [facility] [address] [place], [place] [id number]  Phone [phone] Fax [phone]  A total of 30 minutes was spent face-to-face with this patient. Over half this time was spent on counseling patient on the IIH diagnosis and different diagnostic and therapeutic options available.

## 2016-05-03 DIAGNOSIS — G932 Benign intracranial hypertension: Secondary | ICD-10-CM | POA: Diagnosis not present

## 2016-05-03 DIAGNOSIS — H53021 Refractive amblyopia, right eye: Secondary | ICD-10-CM | POA: Diagnosis not present

## 2016-05-30 DIAGNOSIS — M222X1 Patellofemoral disorders, right knee: Secondary | ICD-10-CM | POA: Diagnosis not present

## 2016-06-08 ENCOUNTER — Telehealth: Payer: Self-pay

## 2016-06-08 MED ORDER — TOPIRAMATE ER 200 MG PO CAP24: 200.0000 mg | ORAL_CAPSULE | Freq: Every day | ORAL | 11 refills | Status: DC

## 2016-06-08 NOTE — Telephone Encounter (Signed)
Reference Number: [id number].  TROKENDI XR  CAP 200MG  is denied due to Plan Exclusion.  For further questions, call [phone]. Appeals are not supported through ePA. Please refer to the written case notice for appeals information and instructions.

## 2016-06-09 DIAGNOSIS — M222X1 Patellofemoral disorders, right knee: Secondary | ICD-10-CM | POA: Diagnosis not present

## 2016-06-15 ENCOUNTER — Encounter: Payer: Self-pay | Admitting: Internal Medicine

## 2016-06-24 DIAGNOSIS — M222X1 Patellofemoral disorders, right knee: Secondary | ICD-10-CM | POA: Diagnosis not present

## 2016-08-02 DIAGNOSIS — N926 Irregular menstruation, unspecified: Secondary | ICD-10-CM | POA: Diagnosis not present

## 2016-08-02 DIAGNOSIS — N8110 Cystocele, unspecified: Secondary | ICD-10-CM | POA: Diagnosis not present

## 2016-09-01 DIAGNOSIS — N926 Irregular menstruation, unspecified: Secondary | ICD-10-CM | POA: Diagnosis not present

## 2017-01-21 DIAGNOSIS — Z23 Encounter for immunization: Secondary | ICD-10-CM | POA: Diagnosis not present

## 2017-01-25 DIAGNOSIS — Z124 Encounter for screening for malignant neoplasm of cervix: Secondary | ICD-10-CM | POA: Diagnosis not present

## 2017-01-25 DIAGNOSIS — Z01419 Encounter for gynecological examination (general) (routine) without abnormal findings: Secondary | ICD-10-CM | POA: Diagnosis not present

## 2017-01-30 DIAGNOSIS — H43393 Other vitreous opacities, bilateral: Secondary | ICD-10-CM | POA: Diagnosis not present

## 2017-01-30 DIAGNOSIS — G932 Benign intracranial hypertension: Secondary | ICD-10-CM | POA: Diagnosis not present

## 2017-01-30 DIAGNOSIS — H53021 Refractive amblyopia, right eye: Secondary | ICD-10-CM | POA: Diagnosis not present

## 2017-05-23 ENCOUNTER — Other Ambulatory Visit: Payer: Self-pay | Admitting: Gastroenterology

## 2017-05-23 DIAGNOSIS — R1011 Right upper quadrant pain: Secondary | ICD-10-CM

## 2017-05-23 DIAGNOSIS — R1013 Epigastric pain: Secondary | ICD-10-CM | POA: Diagnosis not present

## 2017-05-23 DIAGNOSIS — K582 Mixed irritable bowel syndrome: Secondary | ICD-10-CM | POA: Diagnosis not present

## 2017-06-07 DIAGNOSIS — K3189 Other diseases of stomach and duodenum: Secondary | ICD-10-CM | POA: Diagnosis not present

## 2017-06-07 DIAGNOSIS — K449 Diaphragmatic hernia without obstruction or gangrene: Secondary | ICD-10-CM | POA: Diagnosis not present

## 2017-06-07 DIAGNOSIS — R1013 Epigastric pain: Secondary | ICD-10-CM | POA: Diagnosis not present

## 2017-06-07 DIAGNOSIS — K219 Gastro-esophageal reflux disease without esophagitis: Secondary | ICD-10-CM | POA: Diagnosis not present

## 2017-06-07 DIAGNOSIS — K200 Eosinophilic esophagitis: Secondary | ICD-10-CM | POA: Diagnosis not present

## 2017-06-12 ENCOUNTER — Ambulatory Visit (HOSPITAL_COMMUNITY)
Admission: RE | Admit: 2017-06-12 | Discharge: 2017-06-12 | Disposition: A | Discharge status: Home / Self Care | Payer: 59 | Source: Ambulatory Visit | Attending: Gastroenterology | Admitting: Gastroenterology

## 2017-06-12 DIAGNOSIS — R1011 Right upper quadrant pain: Secondary | ICD-10-CM | POA: Diagnosis not present

## 2017-06-12 DIAGNOSIS — R1010 Upper abdominal pain, unspecified: Secondary | ICD-10-CM | POA: Diagnosis not present

## 2017-06-12 MED ORDER — TECHNETIUM TC 99M MEBROFENIN IV KIT
5.2000 | PACK | Freq: Once | INTRAVENOUS | Status: AC | PRN
  Administered 2017-06-12: 5.2 via INTRAVENOUS

## 2017-10-19 DIAGNOSIS — M1712 Unilateral primary osteoarthritis, left knee: Secondary | ICD-10-CM | POA: Insufficient documentation

## 2017-10-19 DIAGNOSIS — M170 Bilateral primary osteoarthritis of knee: Secondary | ICD-10-CM | POA: Insufficient documentation

## 2018-12-21 IMAGING — US US ABDOMEN COMPLETE
1 series · 14 of 25 positions shown · non-contrast
Comparison: None.

CLINICAL DATA: Right upper quadrant abdominal pain for 2 months.

EXAM:
ABDOMEN ULTRASOUND COMPLETE

[Series 1: us abdomen complete · 14 of 83 slices shown]
[im 1/83]
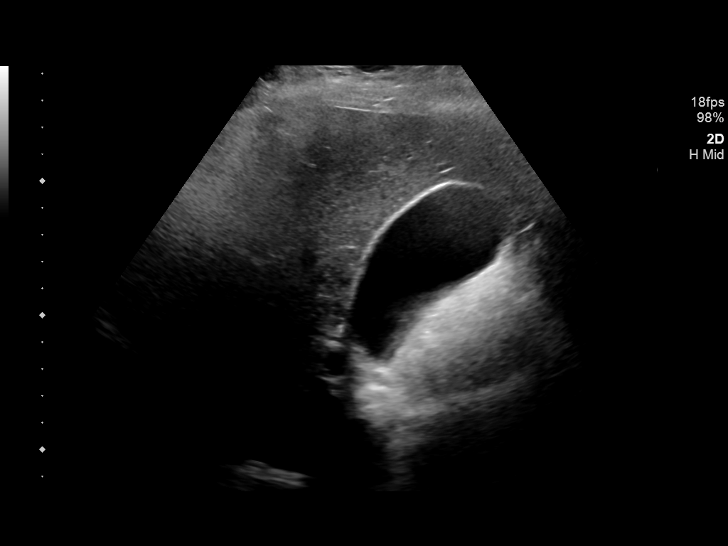
[im 7/83]
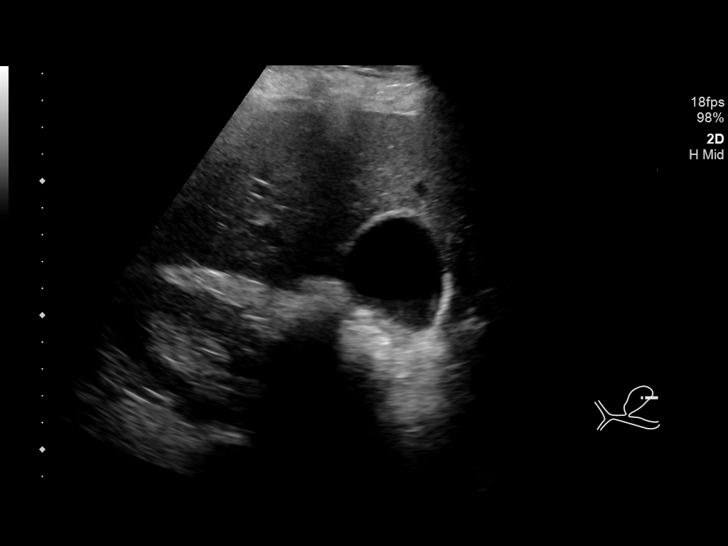
[im 14/83]
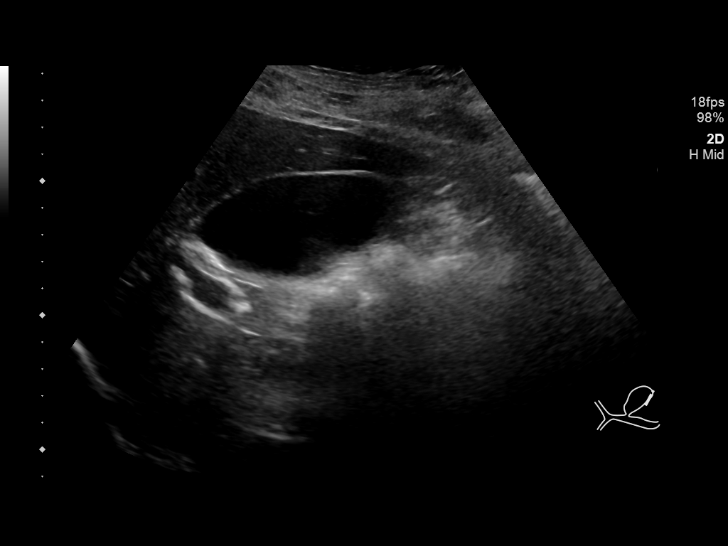
[im 21/83]
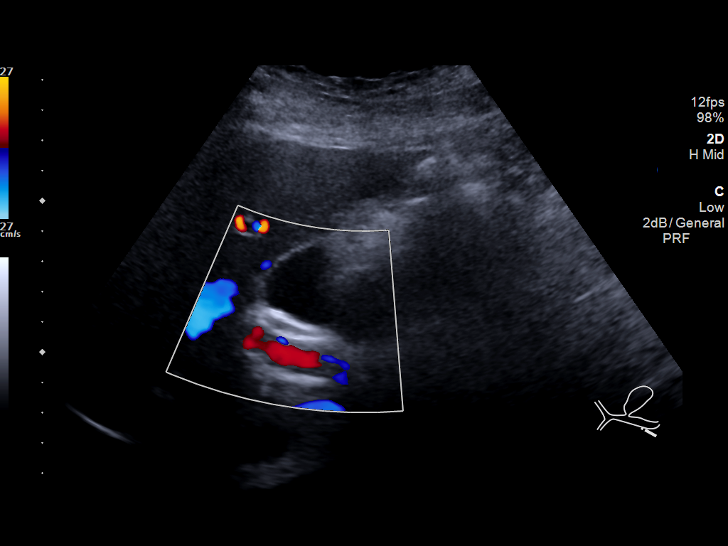
[im 28/83]
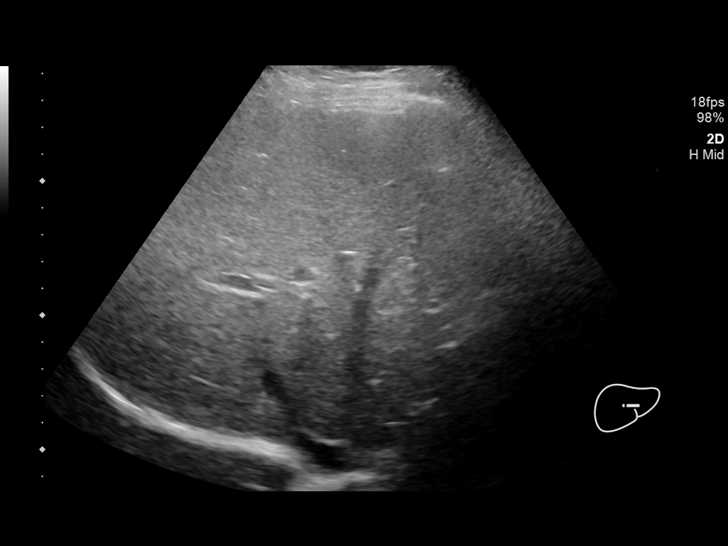
[im 31/83]
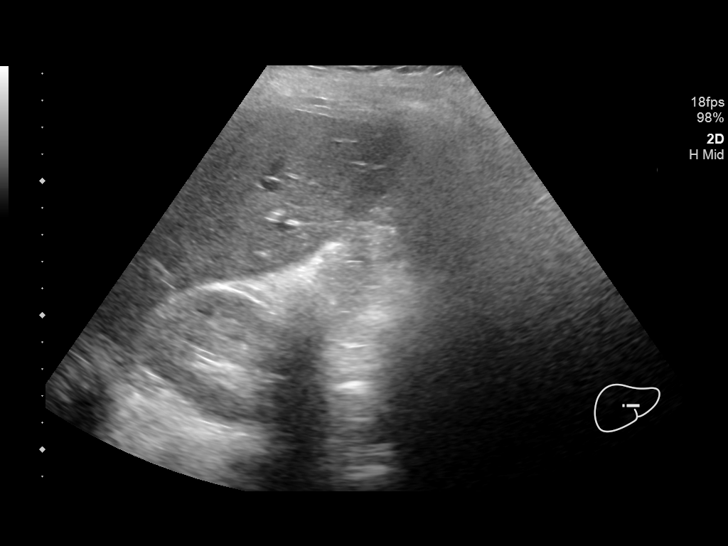
[im 38/83]
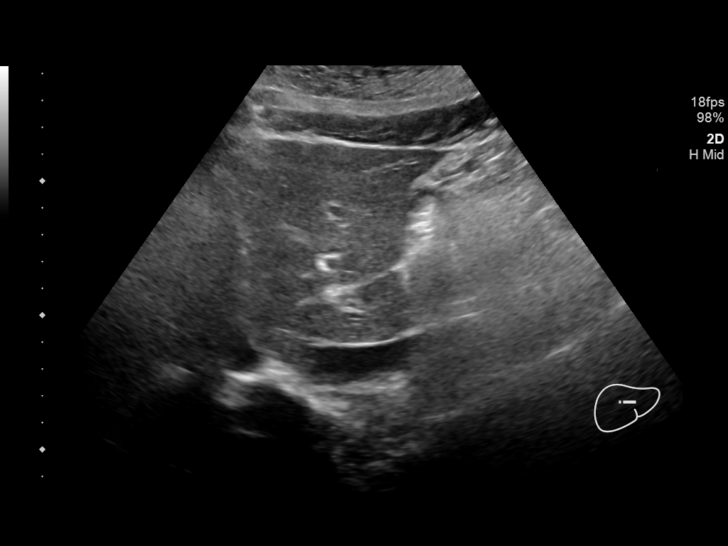
[im 45/83]
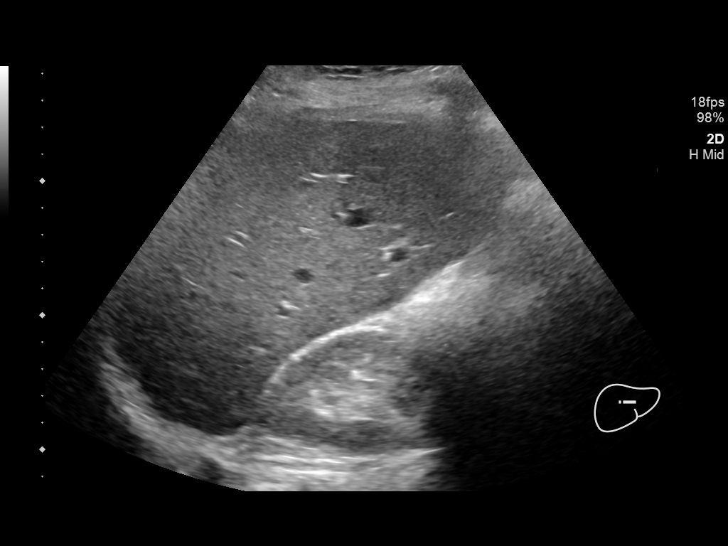
[im 52/83]
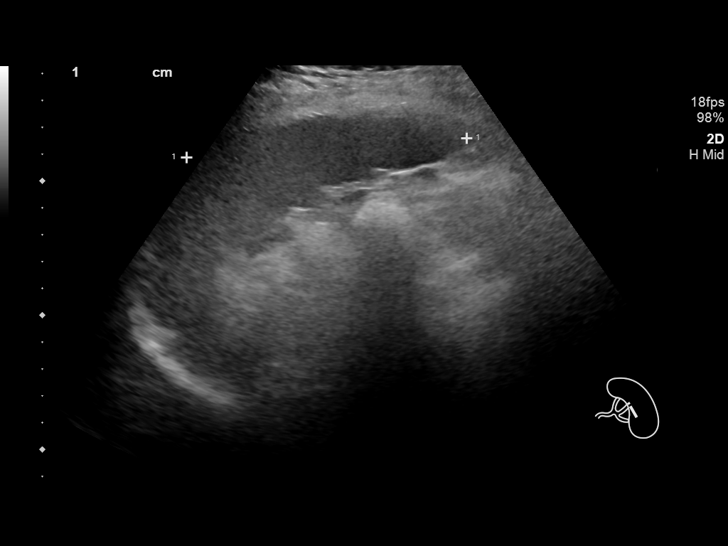
[im 55/83]
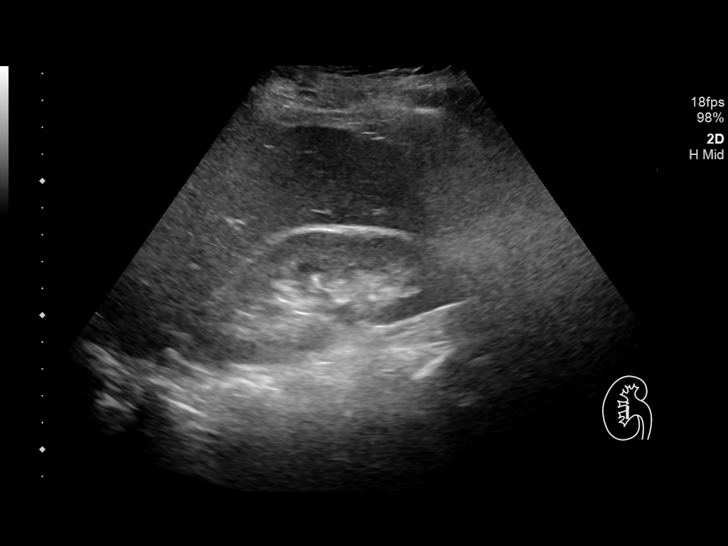
[im 62/83]
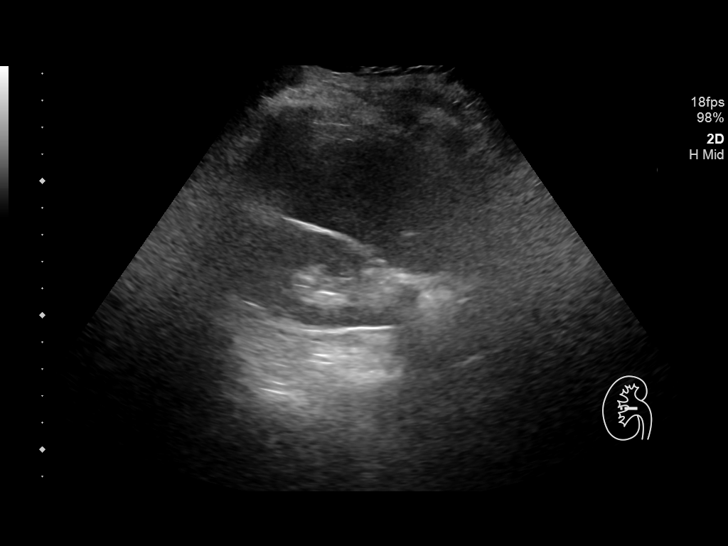
[im 69/83]
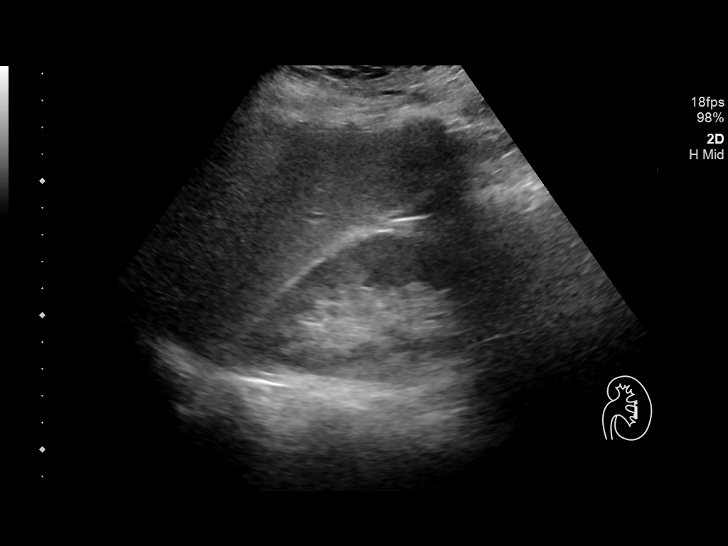
[im 76/83]
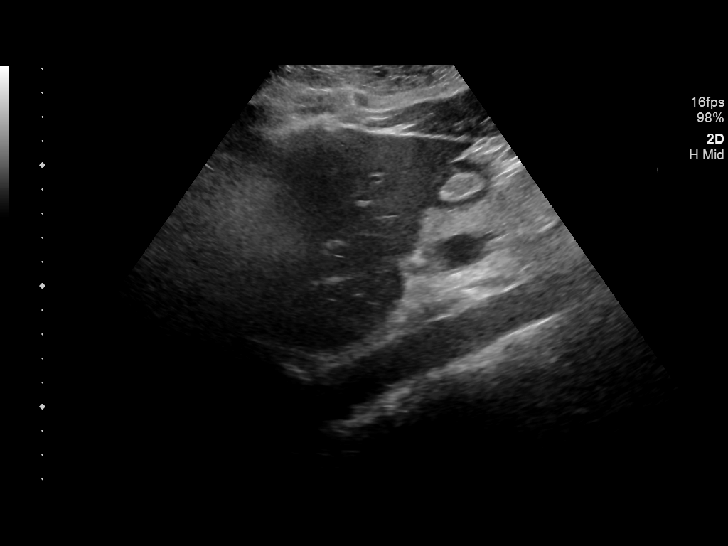
[im 83/83]
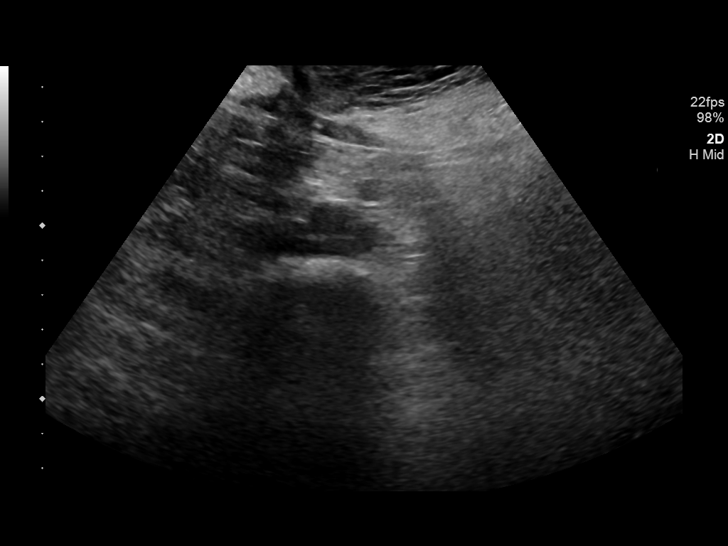

[14 of 25 positions shown; findings below may reference images not displayed]

FINDINGS: Gallbladder: No gallstones or wall thickening visualized. No
sonographic Murphy sign noted by sonographer.

Common bile duct: Diameter: 4 mm

Liver: Mild increase in the liver parenchymal echogenicity. Liver
normal in size. No mass or focal lesion. Portal vein is patent on
color Doppler imaging with normal direction of blood flow towards
the liver.

IVC: No abnormality visualized.

Pancreas: Visualized portion unremarkable.

Spleen: Size and appearance within normal limits.

Right Kidney: Length: 10.7 cm. Echogenicity within normal limits. No
mass or hydronephrosis visualized.

Left Kidney: Length: 10.8 cm. Echogenicity within normal limits. No
mass or hydronephrosis visualized.

Abdominal aorta: No aneurysm visualized.

Other findings: None.
IMPRESSION: 1. No acute findings.  Normal gallbladder.  No bile duct dilation.
2. Mild increase in liver parenchymal echogenicity suggests hepatic
steatosis. No other abnormality.

## 2019-06-27 ENCOUNTER — Ambulatory Visit: Payer: PRIVATE HEALTH INSURANCE | Attending: Internal Medicine

## 2019-06-27 DIAGNOSIS — Z23 Encounter for immunization: Secondary | ICD-10-CM

## 2019-06-27 NOTE — Progress Notes (Signed)
   Covid-19 Vaccination Clinic  Name:  KORBIN NOTARO    MRN: [id number] DOB: December 20, 1975  06/27/2019  Ms. Vu was observed post Covid-19 immunization for 15 minutes without incident. She was provided with Vaccine Information Sheet and instruction to access the V-Safe system.   Ms. Amaral was instructed to call 911 with any severe reactions post vaccine: Marland Kitchen Difficulty breathing  . Swelling of face and throat  . A fast heartbeat  . A bad rash all over body  . Dizziness and weakness   Immunizations Administered    Name Date Dose VIS Date Route   Pfizer COVID-19 Vaccine 06/27/2019  8:23 AM 0.3 mL 02/22/2019 Intramuscular   Manufacturer: [employer], [employer]   Lot: [id number]   NDC: [address]-8

## 2019-07-22 ENCOUNTER — Ambulatory Visit: Payer: PRIVATE HEALTH INSURANCE | Attending: Internal Medicine

## 2019-10-17 ENCOUNTER — Ambulatory Visit (INDEPENDENT_AMBULATORY_CARE_PROVIDER_SITE_OTHER): Payer: 59 | Admitting: Family Medicine

## 2019-10-17 ENCOUNTER — Other Ambulatory Visit: Payer: Self-pay

## 2019-10-17 ENCOUNTER — Encounter (INDEPENDENT_AMBULATORY_CARE_PROVIDER_SITE_OTHER): Payer: Self-pay | Admitting: Family Medicine

## 2019-10-17 VITALS — BP 148/104 | HR 101 | Temp 98.8°F | Ht 66.0 in | Wt 259.0 lb

## 2019-10-17 DIAGNOSIS — R0683 Snoring: Secondary | ICD-10-CM

## 2019-10-17 DIAGNOSIS — Z6841 Body Mass Index (BMI) 40.0 and over, adult: Secondary | ICD-10-CM

## 2019-10-17 DIAGNOSIS — R0602 Shortness of breath: Secondary | ICD-10-CM | POA: Diagnosis not present

## 2019-10-17 DIAGNOSIS — K588 Other irritable bowel syndrome: Secondary | ICD-10-CM

## 2019-10-17 DIAGNOSIS — R7301 Impaired fasting glucose: Secondary | ICD-10-CM

## 2019-10-17 DIAGNOSIS — Z0289 Encounter for other administrative examinations: Secondary | ICD-10-CM

## 2019-10-17 DIAGNOSIS — Z1331 Encounter for screening for depression: Secondary | ICD-10-CM

## 2019-10-17 DIAGNOSIS — E559 Vitamin D deficiency, unspecified: Secondary | ICD-10-CM

## 2019-10-17 DIAGNOSIS — G932 Benign intracranial hypertension: Secondary | ICD-10-CM

## 2019-10-17 DIAGNOSIS — K219 Gastro-esophageal reflux disease without esophagitis: Secondary | ICD-10-CM | POA: Diagnosis not present

## 2019-10-17 DIAGNOSIS — J45909 Unspecified asthma, uncomplicated: Secondary | ICD-10-CM

## 2019-10-17 DIAGNOSIS — R5383 Other fatigue: Secondary | ICD-10-CM | POA: Diagnosis not present

## 2019-10-17 DIAGNOSIS — R142 Eructation: Secondary | ICD-10-CM

## 2019-10-17 DIAGNOSIS — Z9189 Other specified personal risk factors, not elsewhere classified: Secondary | ICD-10-CM

## 2019-10-17 DIAGNOSIS — F419 Anxiety disorder, unspecified: Secondary | ICD-10-CM

## 2019-10-17 MED ORDER — ALBUTEROL SULFATE HFA 108 (90 BASE) MCG/ACT IN AERS: 1.0000 | INHALATION_SPRAY | RESPIRATORY_TRACT | 0 refills | Status: DC | PRN

## 2019-10-17 NOTE — Progress Notes (Signed)
Chief Complaint:   OBESITY Tammy Olsen (MR# [id number]) is a 44 y.o. female who presents for evaluation and treatment of obesity and related comorbidities. Current BMI is Body mass index is 41.8 kg/m. Tammy Olsen has been struggling with her weight for many years and has been unsuccessful in either losing weight, maintaining weight loss, or reaching her healthy weight goal.  Tammy Olsen is currently in the action stage of change and ready to dedicate time achieving and maintaining a healthier weight. Tammy Olsen is interested in becoming our patient and working on intensive lifestyle modifications including (but not limited to) diet and exercise for weight loss.  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  She is married and lives with her spouse and 2 children, ages 5711 and 58.  She says she averages 6,000 steps per day.  Tammy Olsen provided the following food recall today:  Breakfast:  Skips. Lunch (11 am):  [place] sandwich, cottage cheese, Wheat Thins. Dinner:  Salad, corn, pasta side. Snacks (not often):  Pretzels. Drinks:  Water and coffee (3 tablespoons creamer). She says she eats as many vegetables as she can.  She is not a lover of vegetables.  She eats fruits.  Tammy Olsen habits were reviewed today and are as follows: Her family eats meals together, she thinks her family will eat healthier with her, her desired weight loss is 80 pounds, she has been heavy most of her life, her heaviest weight ever was her current weight, she craves pasta and sweets, she skips breakfast frequently, she frequently makes poor food choices and she struggles with emotional eating.  Depression Screen Tammy Olsen's Food and Mood (modified PHQ-9) score was 9.  Depression screen PHQ 2/9 10/17/2019  Decreased Interest 2  Down, Depressed, Hopeless 1  PHQ - 2 Score 3  Altered sleeping 0  Tired, decreased energy 2  Change in appetite 1  Feeling bad or failure about yourself  2  Trouble concentrating 1  Moving slowly or  fidgety/restless 0  Suicidal thoughts 0  PHQ-9 Score 9  Difficult doing work/chores Somewhat difficult   Subjective:   1. Other fatigue Tammy Olsen denies daytime somnolence and admits to waking up still tired. Patent has a history of symptoms of morning fatigue, morning headache and snoring. Tammy Olsen generally gets 7 or 8 hours of sleep per night, and states that she has generally restful sleep. Snoring is present. Apneic episodes are not present. Epworth Sleepiness Score is 6.  2. SOB (shortness of breath) on exertion Tammy Olsen notes increasing shortness of breath with exercising and seems to be worsening over time with weight gain. She notes getting out of breath sooner with activity than she used to. This has not gotten worse recently. Tammy Olsen denies shortness of breath at rest or orthopnea.  3. Gastroesophageal reflux disease, unspecified whether esophagitis present She has been experiencing symptoms of GERD.  She takes Protonix 40 mg daily.  4. Vitamin D deficiency She is currently taking OTC vitamin D 2000 IU each day. She denies nausea, vomiting or muscle weakness.  5. IIH (idiopathic intracranial hypertension) Tammy Olsen has IIH and is currently on no treatment for it.   6. Other irritable bowel syndrome Tammy Olsen has IBS and takes Ultra Flora 1B.  7. Snores Tammy Olsen endorses snoring and morning headaches.  Epworth score is 6.  8. Reactive airway disease without complication, unspecified asthma severity, unspecified whether persistent Tammy Olsen uses an albuterol inhaler as needed for reactive airway disease.  She says she has 1 episode every 2  years that lasts 1-3 days.  9. Belching Tammy Olsen endorses belching in the morning along with abdominal pain and diarrhea.  Food allergy test showed allergy to pumpkin.  She has a history of skin prick test showing allergy to cats.  10. Fasting hyperglycemia Tammy Olsen has a history of some elevated blood glucose readings without a diagnosis of diabetes. She denies  polyphagia.  11. Anxiety Tammy Olsen has anxiety and is taking venlafaxine 150 mg daily.  PHQ-9 is 9.  12. Depression screening Tammy Olsen was screened for depression as part of her new patient workup.  13. At risk for deficient intake of food The patient is at a higher than average risk of deficient intake of food.  Assessment/Plan:   1. Other fatigue Tammy Olsen does feel that her weight is causing her energy to be lower than it should be. Fatigue may be related to obesity, depression or many other causes. Labs will be ordered, and in the meanwhile, Tammy Olsen will focus on self care including making healthy food choices, increasing physical activity and focusing on stress reduction.  - EKG 12-Lead - Anemia panel - T4, free - TSH  2. SOB (shortness of breath) on exertion Tammy Olsen does not feel that she gets out of breath more easily that she used to when she exercises. Tammy Olsen shortness of breath appears to be obesity related and exercise induced. She has agreed to work on weight loss and gradually increase exercise to treat her exercise induced shortness of breath. Will continue to monitor closely.  - Lipid Panel With LDL/HDL Ratio  3. Gastroesophageal reflux disease Intensive lifestyle modifications are the first line treatment for this issue. We discussed several lifestyle modifications today and she will continue to work on diet, exercise and weight loss efforts. Orders and follow up as documented in patient record.   Counseling . If a person has gastroesophageal reflux disease (GERD), food and stomach acid move back up into the esophagus and cause symptoms or problems such as damage to the esophagus. . Anti-reflux measures include: raising the head of the bed, avoiding tight clothing or belts, avoiding eating late at night, not lying down shortly after mealtime, and achieving weight loss. . Avoid ASA, NSAID's, caffeine, alcohol, and tobacco.  . OTC Pepcid and/or Tums are often very helpful for as  needed use.  Marland Kitchen However, for persisting chronic or daily symptoms, stronger medications like Omeprazole may be needed. . You may need to avoid foods and drinks such as: ? Coffee and tea (with or without caffeine). ? Drinks that contain alcohol. ? Energy drinks and sports drinks. ? Bubbly (carbonated) drinks or sodas. ? Chocolate and cocoa. ? Peppermint and mint flavorings. ? Garlic and onions. ? Horseradish. ? Spicy and acidic foods. These include peppers, chili powder, curry powder, vinegar, hot sauces, and BBQ sauce. ? Citrus fruit juices and citrus fruits, such as oranges, lemons, and limes. ? Tomato-based foods. These include red sauce, chili, salsa, and pizza with red sauce. ? Fried and fatty foods. These include donuts, french fries, potato chips, and high-fat dressings. ? High-fat meats. These include hot dogs, rib eye steak, sausage, ham, and bacon.  4. Vitamin D deficiency Low Vitamin D level contributes to fatigue and are associated with obesity, breast, and colon cancer.  - VITAMIN D 25 Hydroxy (Vit-D Deficiency, Fractures)  5. IIH (idiopathic intracranial hypertension) Will continue to monitor.  - CBC with Differential/Platelet - Comprehensive metabolic panel - Lipid Panel With LDL/HDL Ratio - T4, free - TSH  6. Other irritable  bowel syndrome Current treatment plan is effective, no change in therapy.  7. Snores We will continue to monitor.  8. Reactive airway disease without complication I have sent in a refill for albuterol as a courtesy. Further refills will be completed by PCP.  - albuterol (VENTOLIN HFA) 108 (90 Base) MCG/ACT inhaler; Inhale 1-2 puffs into the lungs every 4 (four) hours as needed for wheezing or shortness of breath.  Dispense: 18 g; Refill: 0  9. Belching We will continue to monitor. Orders and follow up as documented in patient record.  10. Fasting hyperglycemia Fasting labs will be obtained and results with be discussed with Jama in 2  weeks at her follow up visit. In the meanwhile Nahiara was started on a lower simple carbohydrate diet and will work on weight loss efforts.  Orders - Hemoglobin A1c - Insulin, random  11. Anxiety Behavior modification techniques were discussed today to help Revella deal with her emotional/non-hunger eating behaviors.  Orders and follow up as documented in patient record.   12. Depression screening Laylana had a positive depression screening. Depression is commonly associated with obesity and often results in emotional eating behaviors. We will monitor this closely and work on CBT to help improve the non-hunger eating patterns. Referral to Psychology may be required if no improvement is seen as she continues in our clinic.  13. At risk for deficient intake of food Marlisha was given approximately 15 minutes of deficit intake of food prevention counseling today. Adeleine is at risk for eating too few calories based on current food recall. She was encouraged to focus on meeting caloric and protein goals according to her recommended meal plan.   14. Class 3 severe obesity with serious comorbidity and body mass index (BMI) of 40.0 to 44.9 in adult, unspecified obesity type [facility]) Brae is currently in the action stage of change and her goal is to continue with weight loss efforts. I recommend Nazirah begin the structured treatment plan as follows:  She has agreed to the Category 3 Plan.  Exercise goals: No exercise has been prescribed at this time.   Behavioral modification strategies: increasing lean protein intake, decreasing simple carbohydrates, increasing vegetables, increasing water intake and decreasing liquid calories.  She was informed of the importance of frequent follow-up visits to maximize her success with intensive lifestyle modifications for her multiple health conditions. She was informed we would discuss her lab results at her next visit unless there is a critical issue that needs to be  addressed sooner. Philip agreed to keep her next visit at the agreed upon time to discuss these results.  Objective:   Blood pressure (!) 148/104, pulse (!) 101, temperature 98.8 F (37.1 C), temperature source Oral, height 5\' 6"  (1.676 m), weight 259 lb (117.5 kg), SpO2 96 %, unknown if currently breastfeeding. Body mass index is 41.8 kg/m.  EKG: Normal sinus rhythm, rate 101 bpm.  Indirect Calorimeter completed today shows a VO2 of 316 and a REE of 2198.  Her calculated basal metabolic rate is 2199 thus her basal metabolic rate is better than expected.  General: Cooperative, alert, well developed, in no acute distress. HEENT: Conjunctivae and lids unremarkable. Cardiovascular: Regular rhythm.  Lungs: Normal work of breathing. Neurologic: No focal deficits.   Lab Results  Component Value Date   CREATININE 0.74 06/15/2015   BUN 11 06/15/2015   NA 138 06/15/2015   K 3.9 06/15/2015   CL 107 06/15/2015   CO2 22 06/15/2015   Lab Results  Component Value Date   ALT 11 06/15/2015   AST 15 06/15/2015   ALKPHOS 83 06/15/2015   BILITOT 0.3 06/15/2015   Lab Results  Component Value Date   HGBA1C 5.1 06/15/2015   HGBA1C 5.0 04/02/2014   No results found for: INSULIN Lab Results  Component Value Date   TSH 1.57 06/15/2015   Lab Results  Component Value Date   CHOL 184 06/15/2015   HDL 29 (L) 06/15/2015   LDLCALC 122 06/15/2015   TRIG 167 (H) 06/15/2015   CHOLHDL 6.3 (H) 06/15/2015   Lab Results  Component Value Date   WBC 8.1 06/15/2015   HGB 14.8 06/15/2015   HCT 42.9 06/15/2015   MCV 86.3 06/15/2015   PLT 284 06/15/2015   Lab Results  Component Value Date   IRON 58 06/15/2015   TIBC 262 06/15/2015   Attestation Statements:   This is the patient's first visit at [facility]. The patient's NEW PATIENT PACKET was reviewed at length. Included in the packet: current and past health history, medications, allergies, ROS, gynecologic history (women  only), surgical history, family history, social history, weight history, weight loss surgery history (for those that have had weight loss surgery), nutritional evaluation, mood and food questionnaire, PHQ9, Epworth questionnaire, sleep habits questionnaire, patient life and health improvement goals questionnaire. These will all be scanned into the patient's chart under media.   During the visit, I independently reviewed the patient's EKG, bioimpedance scale results, and indirect calorimeter results. I used this information to tailor a meal plan for the patient that will help her to lose weight and will improve her obesity-related conditions going forward. I performed a medically necessary appropriate examination and/or evaluation. I discussed the assessment and treatment plan with the patient. The patient was provided an opportunity to ask questions and all were answered. The patient agreed with the plan and demonstrated an understanding of the instructions. Labs were ordered at this visit and will be reviewed at the next visit unless more critical results need to be addressed immediately. Clinical information was updated and documented in the EMR.   I, Insurance claims handler, CMA, am acting as transcriptionist for Helane Rima, DO  I have reviewed the above documentation for accuracy and completeness, and I agree with the above. Helane Rima, DO

## 2019-10-18 LAB — COMPREHENSIVE METABOLIC PANEL
ALT: 18 IU/L (ref 0–32)
AST: 20 IU/L (ref 0–40)
Albumin/Globulin Ratio: 1.4 (ref 1.2–2.2)
Albumin: 4.1 g/dL (ref 3.8–4.8)
Alkaline Phosphatase: 94 IU/L (ref 48–121)
BUN/Creatinine Ratio: 11 (ref 9–23)
BUN: 8 mg/dL (ref 6–24)
Bilirubin Total: 0.3 mg/dL (ref 0.0–1.2)
CO2: 23 mmol/L (ref 20–29)
Calcium: 9.7 mg/dL (ref 8.7–10.2)
Chloride: 103 mmol/L (ref 96–106)
Creatinine, Ser: 0.7 mg/dL (ref 0.57–1.00)
GFR calc Af Amer: 124 mL/min/{1.73_m2} (ref >59)
GFR calc non Af Amer: 107 mL/min/{1.73_m2} (ref >59)
Globulin, Total: 3 g/dL (ref 1.5–4.5)
Glucose: 83 mg/dL (ref 65–99)
Potassium: 4.1 mmol/L (ref 3.5–5.2)
Sodium: 140 mmol/L (ref 134–144)
Total Protein: 7.1 g/dL (ref 6.0–8.5)

## 2019-10-18 LAB — CBC WITH DIFFERENTIAL/PLATELET
Basophils Absolute: 0.1 10*3/uL (ref 0.0–0.2)
Basos: 1 %
EOS (ABSOLUTE): 0.3 10*3/uL (ref 0.0–0.4)
Eos: 3 %
Hemoglobin: 14.7 g/dL (ref 11.1–15.9)
Immature Grans (Abs): 0 10*3/uL (ref 0.0–0.1)
Immature Granulocytes: 0 %
Lymphocytes Absolute: 2.3 10*3/uL (ref 0.7–3.1)
Lymphs: 25 %
MCH: 29.6 pg (ref 26.6–33.0)
MCHC: 33.6 g/dL (ref 31.5–35.7)
MCV: 88 fL (ref 79–97)
Monocytes Absolute: 0.5 10*3/uL (ref 0.1–0.9)
Monocytes: 6 %
Neutrophils Absolute: 6 10*3/uL (ref 1.4–7.0)
Neutrophils: 65 %
Platelets: 364 10*3/uL (ref 150–450)
RBC: 4.97 x10E6/uL (ref 3.77–5.28)
RDW: 13 % (ref 11.7–15.4)
WBC: 9.2 10*3/uL (ref 3.4–10.8)

## 2019-10-18 LAB — T4, FREE: Free T4: 1.24 ng/dL (ref 0.82–1.77)

## 2019-10-18 LAB — ANEMIA PANEL
Ferritin: 101 ng/mL (ref 15–150)
Folate, Hemolysate: >620 ng/mL
Folate, RBC: >1419 ng/mL (ref >498)
Hematocrit: 43.7 % (ref 34.0–46.6)
Iron Saturation: 23 % (ref 15–55)
Iron: 81 ug/dL (ref 27–159)
Retic Ct Pct: 1.9 % (ref 0.6–2.6)
Total Iron Binding Capacity: 357 ug/dL (ref 250–450)
UIBC: 276 ug/dL (ref 131–425)
Vitamin B-12: 499 pg/mL (ref 232–1245)

## 2019-10-18 LAB — VITAMIN D 25 HYDROXY (VIT D DEFICIENCY, FRACTURES): Vit D, 25-Hydroxy: 51.8 ng/mL (ref 30.0–100.0)

## 2019-10-18 LAB — HEMOGLOBIN A1C
Est. average glucose Bld gHb Est-mCnc: 94 mg/dL
Hgb A1c MFr Bld: 4.9 % (ref 4.8–5.6)

## 2019-10-18 LAB — LIPID PANEL WITH LDL/HDL RATIO
Cholesterol, Total: 237 mg/dL — ABNORMAL HIGH (ref 100–199)
HDL: 52 mg/dL (ref >39)
LDL Chol Calc (NIH): 157 mg/dL — ABNORMAL HIGH (ref 0–99)
LDL/HDL Ratio: 3 ratio (ref 0.0–3.2)
Triglycerides: 154 mg/dL — ABNORMAL HIGH (ref 0–149)
VLDL Cholesterol Cal: 28 mg/dL (ref 5–40)

## 2019-10-18 LAB — INSULIN, RANDOM: INSULIN: 13.8 u[IU]/mL (ref 2.6–24.9)

## 2019-10-18 LAB — TSH: TSH: 1.62 u[IU]/mL (ref 0.450–4.500)

## 2019-10-31 ENCOUNTER — Other Ambulatory Visit: Payer: Self-pay

## 2019-10-31 ENCOUNTER — Ambulatory Visit (INDEPENDENT_AMBULATORY_CARE_PROVIDER_SITE_OTHER): Payer: 59 | Admitting: Family Medicine

## 2019-10-31 ENCOUNTER — Encounter (INDEPENDENT_AMBULATORY_CARE_PROVIDER_SITE_OTHER): Payer: Self-pay | Admitting: Family Medicine

## 2019-10-31 VITALS — BP 127/93 | HR 96 | Temp 98.4°F | Ht 66.0 in | Wt 253.0 lb

## 2019-10-31 DIAGNOSIS — E8881 Metabolic syndrome: Secondary | ICD-10-CM | POA: Diagnosis not present

## 2019-10-31 DIAGNOSIS — Z6841 Body Mass Index (BMI) 40.0 and over, adult: Secondary | ICD-10-CM

## 2019-10-31 DIAGNOSIS — E782 Mixed hyperlipidemia: Secondary | ICD-10-CM

## 2019-10-31 DIAGNOSIS — Z9189 Other specified personal risk factors, not elsewhere classified: Secondary | ICD-10-CM | POA: Diagnosis not present

## 2019-10-31 NOTE — Progress Notes (Signed)
Chief Complaint:   OBESITY Tammy Olsen is here to discuss her progress with her obesity treatment plan along with follow-up of her obesity related diagnoses. Tammy Olsen is on the Category 3 Plan and states she is following her eating plan approximately 90% of the time. Tammy Olsen states she is walking for 30 minutes 3 times per week.  Today's visit was #: 2 Starting weight: 259 lbs Starting date: 10/17/2019 Today's weight: 253 lbs Today's date: 10/31/2019 Total lbs lost to date: 6 lbs Total lbs lost since last in-office visit: 6 lbs  Interim History: Tammy Olsen says she is "tired of meat", especially lunch.  She reports drinking a lot of water.  She has been eating high protein cereal and milk.  Subjective:   1. Mixed hyperlipidemia  Lab Results  Component Value Date   ALT 18 10/17/2019   AST 20 10/17/2019   ALKPHOS 94 10/17/2019   BILITOT 0.3 10/17/2019   Lab Results  Component Value Date   CHOL 237 (H) 10/17/2019   HDL 52 10/17/2019   LDLCALC 157 (H) 10/17/2019   TRIG 154 (H) 10/17/2019   CHOLHDL 6.3 (H) 06/15/2015   2. Insulin resistance Tammy Olsen has a diagnosis of insulin resistance based on her elevated fasting insulin level >5.   Lab Results  Component Value Date   INSULIN 13.8 10/17/2019   Lab Results  Component Value Date   HGBA1C 4.9 10/17/2019   Assessment/Plan:   1. Mixed hyperlipidemia Not at goal. Target levels for LDL are: < 130 mg/dl (2 or more risk factors are present). Plan: Dietary changes: Increase soluble fiber. Decrease simple carbohydrates. Exercise changes: An average 40 minutes of moderate to vigorous-intensity aerobic activity 3 or 4 times per week. Lipid-lowering medications: none at this time. Recheck labs in 3 months.  2. Insulin resistance Not optimized. Goal is HgbA1c < 5.7 and insulin level closer to 5. Tammy Olsen will continue to work on weight loss, exercise, and decreasing simple carbohydrates to help decrease the risk of diabetes. Tammy Olsen agreed to  follow-up with [place] as directed to closely monitor her progress.  3. At risk for deficient intake of food Tammy Olsen was given approximately 15 minutes of deficit intake of food prevention counseling today. Tammy Olsen is at risk for eating too few calories based on current food recall. She was encouraged to focus on meeting caloric and protein goals according to her recommended meal plan.   4. Class 3 severe obesity with serious comorbidity and body mass index (BMI) of 40.0 to 44.9 in adult, unspecified obesity type Tammy Olsen) Tammy Olsen is currently in the action stage of change. As such, her goal is to continue with weight loss efforts. She has agreed to the Category 3 Plan.   Exercise goals: Add 10 minutes of strength training 3 times per week.  Behavioral modification strategies: increasing lean protein intake, decreasing simple carbohydrates and increasing vegetables.  Tammy Olsen has agreed to follow-up with our clinic in 3 weeks. She was informed of the importance of frequent follow-up visits to maximize her success with intensive lifestyle modifications for her multiple health conditions.   Objective:   Blood pressure (!) 127/93, pulse 96, temperature 98.4 F (36.9 C), temperature source Oral, height 5\' 6"  (1.676 m), weight 253 lb (114.8 kg), SpO2 97 %, unknown if currently breastfeeding. Body mass index is 40.84 kg/m.  General: Cooperative, alert, well developed, in no acute distress. HEENT: Conjunctivae and lids unremarkable. Cardiovascular: Regular rhythm.  Lungs: Normal work of breathing. Neurologic: No focal deficits.  Lab Results  Component Value Date   CREATININE 0.70 10/17/2019   BUN 8 10/17/2019   NA 140 10/17/2019   K 4.1 10/17/2019   CL 103 10/17/2019   CO2 23 10/17/2019   Lab Results  Component Value Date   ALT 18 10/17/2019   AST 20 10/17/2019   ALKPHOS 94 10/17/2019   BILITOT 0.3 10/17/2019   Lab Results  Component Value Date   HGBA1C 4.9 10/17/2019   HGBA1C 5.1 06/15/2015    HGBA1C 5.0 04/02/2014   Lab Results  Component Value Date   INSULIN 13.8 10/17/2019   Lab Results  Component Value Date   TSH 1.620 10/17/2019   Lab Results  Component Value Date   CHOL 237 (H) 10/17/2019   HDL 52 10/17/2019   LDLCALC 157 (H) 10/17/2019   TRIG 154 (H) 10/17/2019   CHOLHDL 6.3 (H) 06/15/2015   Lab Results  Component Value Date   WBC 9.2 10/17/2019   HGB 14.7 10/17/2019   HCT 43.7 10/17/2019   MCV 88 10/17/2019   PLT 364 10/17/2019   Lab Results  Component Value Date   IRON 81 10/17/2019   TIBC 357 10/17/2019   FERRITIN 101 10/17/2019   Attestation Statements:   Reviewed by clinician on day of visit: allergies, medications, problem list, medical history, surgical history, family history, social history, and previous encounter notes.  I, Insurance claims handler, CMA, am acting as transcriptionist for Helane Rima, DO  I have reviewed the above documentation for accuracy and completeness, and I agree with the above. Helane Rima, DO

## 2019-11-26 ENCOUNTER — Other Ambulatory Visit: Payer: Self-pay

## 2019-11-26 ENCOUNTER — Ambulatory Visit (INDEPENDENT_AMBULATORY_CARE_PROVIDER_SITE_OTHER): Payer: 59 | Admitting: Family Medicine

## 2019-11-26 ENCOUNTER — Encounter (INDEPENDENT_AMBULATORY_CARE_PROVIDER_SITE_OTHER): Payer: Self-pay | Admitting: Family Medicine

## 2019-11-26 VITALS — BP 141/101 | HR 93 | Temp 98.3°F | Ht 66.0 in | Wt 248.0 lb

## 2019-11-26 DIAGNOSIS — E559 Vitamin D deficiency, unspecified: Secondary | ICD-10-CM

## 2019-11-26 DIAGNOSIS — E782 Mixed hyperlipidemia: Secondary | ICD-10-CM | POA: Diagnosis not present

## 2019-11-26 DIAGNOSIS — R0989 Other specified symptoms and signs involving the circulatory and respiratory systems: Secondary | ICD-10-CM | POA: Diagnosis not present

## 2019-11-26 DIAGNOSIS — Z9189 Other specified personal risk factors, not elsewhere classified: Secondary | ICD-10-CM

## 2019-11-26 DIAGNOSIS — Z6841 Body Mass Index (BMI) 40.0 and over, adult: Secondary | ICD-10-CM

## 2019-11-28 NOTE — Progress Notes (Signed)
Chief Complaint:   OBESITY Tammy Olsen is here to discuss her progress with her obesity treatment plan along with follow-up of her obesity related diagnoses. Tammy Olsen is on the Category 3 Plan and states she is following her eating plan approximately 95% of the time. Tammy Olsen states she is walking and strength training for 15-45 minutes 3-7 times per week.  Today's visit was #: 3 Starting weight: 259 lbs Starting date: 10/17/2019 Today's weight: 248 lbs Today's date: 11/26/2019 Total lbs lost to date: 11 lbs Total lbs lost since last in-office visit: 5 lbs Total weight loss percentage to date: -8.15%  Interim History: Tammy Olsen says her family makes her struggle with food choices due to unintentional sabotage.  She has had no headaches since starting the program.  She still feels guilty if she is not 100% on program. Today's bioimpedance results indicate that Tammy Olsen has gained 2 pounds of water weight since her last visit. She is having her menses.  She says it is her "first menses in forever".  Assessment/Plan:   1. Labile blood pressure Not at goal. No red flags today. Plan: Monitor home BP. Diet: Avoid buying foods that are: processed, frozen, or prepackaged to avoid excess salt. Recheck BP at next visit. The patient understands monitoring parameters and red flags.   BP Readings from Last 3 Encounters:  11/26/19 (!) 141/101  10/31/19 (!) 127/93  10/17/19 (!) 148/104   2. Mixed hyperlipidemia Cardiovascular risk and specific lipid/LDL goals reviewed.  No medication indicated at this time (ASCVD low, LDL is less than 190, not diabetic). We discussed several lifestyle modifications today and [name] will continue to work on diet, exercise and weight loss efforts.    Lab Results  Component Value Date   ALT 18 10/17/2019   AST 20 10/17/2019   ALKPHOS 94 10/17/2019   BILITOT 0.3 10/17/2019   Lab Results  Component Value Date   CHOL 237 (H) 10/17/2019   HDL 52 10/17/2019   LDLCALC 157 (H)  10/17/2019   TRIG 154 (H) 10/17/2019   CHOLHDL 6.3 (H) 06/15/2015   The 10-year ASCVD risk score Denman George DC Jr., et al., 2013) is: 1.2%   Values used to calculate the score:     Age: 44 years     Sex: Female     Is Non-Hispanic African American: No     Diabetic: No     Tobacco smoker: No     Systolic Blood Pressure: 141 mmHg     Is BP treated: No     HDL Cholesterol: 52 mg/dL     Total Cholesterol: 237 mg/dL  3. Vitamin D deficiency Current vitamin D is 51.8, tested on 10/17/2019. Not at goal. Optimal goal > 50 ng/dL. There is also evidence to support a goal of >70 ng/dL in patients with cancer and heart disease. Plan: Continue Vitamin D @4 ,000 IU daily with follow-up for routine testing of Vitamin D at least 2-3 times per year to avoid over-replacement.  4. At risk for obstructive sleep apnea Tammy Olsen was given approximately 15 minutes of OSA prevention counseling today. Tammy Olsen is at increased risk for sleep apnea due to obesity, snoring, history of labile blood pressure, and elevated diastolic numbers. We will monitor and refer to Sleep Specialist if needed in the future.   5. Class 3 severe obesity with serious comorbidity and body mass index (BMI) of 40.0 to 44.9 in adult, unspecified obesity type Tammy Olsen) Tammy Olsen is currently in the action stage of change. As such,  her goal is to continue with weight loss efforts. She has agreed to the Category 3 Plan.   Exercise goals: For substantial health benefits, adults should do at least 150 minutes (2 hours and 30 minutes) a week of moderate-intensity, or 75 minutes (1 hour and 15 minutes) a week of vigorous-intensity aerobic physical activity, or an equivalent combination of moderate- and vigorous-intensity aerobic activity. Aerobic activity should be performed in episodes of at least 10 minutes, and preferably, it should be spread throughout the week.  Behavioral modification strategies: increasing lean protein intake and dealing with family or coworker  sabotage.  Tammy Olsen has agreed to follow-up with our clinic in 3 weeks. She was informed of the importance of frequent follow-up visits to maximize her success with intensive lifestyle modifications for her multiple health conditions.   Objective:   Blood pressure (!) 141/101, pulse 93, temperature 98.3 F (36.8 C), temperature source Oral, height 5\' 6"  (1.676 m), weight 248 lb (112.5 kg), SpO2 97 %, unknown if currently breastfeeding. Body mass index is 40.03 kg/m.  General: Cooperative, alert, well developed, in no acute distress. HEENT: Conjunctivae and lids unremarkable. Cardiovascular: Regular rhythm.  Lungs: Normal work of breathing. Neurologic: No focal deficits.   Lab Results  Component Value Date   CREATININE 0.70 10/17/2019   BUN 8 10/17/2019   NA 140 10/17/2019   K 4.1 10/17/2019   CL 103 10/17/2019   CO2 23 10/17/2019   Lab Results  Component Value Date   ALT 18 10/17/2019   AST 20 10/17/2019   ALKPHOS 94 10/17/2019   BILITOT 0.3 10/17/2019   Lab Results  Component Value Date   HGBA1C 4.9 10/17/2019   HGBA1C 5.1 06/15/2015   HGBA1C 5.0 04/02/2014   Lab Results  Component Value Date   INSULIN 13.8 10/17/2019   Lab Results  Component Value Date   TSH 1.620 10/17/2019   Lab Results  Component Value Date   CHOL 237 (H) 10/17/2019   HDL 52 10/17/2019   LDLCALC 157 (H) 10/17/2019   TRIG 154 (H) 10/17/2019   CHOLHDL 6.3 (H) 06/15/2015   Lab Results  Component Value Date   WBC 9.2 10/17/2019   HGB 14.7 10/17/2019   HCT 43.7 10/17/2019   MCV 88 10/17/2019   PLT 364 10/17/2019   Lab Results  Component Value Date   IRON 81 10/17/2019   TIBC 357 10/17/2019   FERRITIN 101 10/17/2019   Attestation Statements:   Reviewed by clinician on day of visit: allergies, medications, problem list, medical history, surgical history, family history, social history, and previous encounter notes.  I, 12/17/2019, CMA, am acting as transcriptionist for Insurance claims handler, DO  I have reviewed the above documentation for accuracy and completeness, and I agree with the above. Tammy Rima, DO

## 2019-12-18 ENCOUNTER — Other Ambulatory Visit: Payer: Self-pay

## 2019-12-18 ENCOUNTER — Encounter (INDEPENDENT_AMBULATORY_CARE_PROVIDER_SITE_OTHER): Payer: Self-pay | Admitting: Family Medicine

## 2019-12-18 ENCOUNTER — Ambulatory Visit (INDEPENDENT_AMBULATORY_CARE_PROVIDER_SITE_OTHER): Payer: 59 | Admitting: Family Medicine

## 2019-12-18 VITALS — BP 150/105 | HR 90 | Temp 98.2°F | Ht 66.0 in | Wt 244.0 lb

## 2019-12-18 DIAGNOSIS — Z9189 Other specified personal risk factors, not elsewhere classified: Secondary | ICD-10-CM | POA: Diagnosis not present

## 2019-12-18 DIAGNOSIS — R030 Elevated blood-pressure reading, without diagnosis of hypertension: Secondary | ICD-10-CM

## 2019-12-18 DIAGNOSIS — E559 Vitamin D deficiency, unspecified: Secondary | ICD-10-CM | POA: Diagnosis not present

## 2019-12-18 DIAGNOSIS — Z6839 Body mass index (BMI) 39.0-39.9, adult: Secondary | ICD-10-CM

## 2019-12-18 DIAGNOSIS — E7849 Other hyperlipidemia: Secondary | ICD-10-CM | POA: Diagnosis not present

## 2019-12-18 DIAGNOSIS — E66812 Obesity, class 2: Secondary | ICD-10-CM

## 2019-12-19 NOTE — Progress Notes (Signed)
Chief Complaint:   OBESITY Tammy Olsen is here to discuss her progress with her obesity treatment plan along with follow-up of her obesity related diagnoses. Tammy Olsen is on the Category 3 Plan and states she is following her eating plan approximately 95% of the time. Tammy Olsen states she is walking and strength training for 30-45 minutes 7 times per week.  Today's visit was #: 4 Starting weight: 259 lbs Starting date: 10/17/2019 Today's weight: 244 lbs Today's date: 12/18/2019 Total lbs lost to date: 15 lbs Total lbs lost since last in-office visit: 4 lbs  Interim History: Tammy Olsen's blood pressure is elevated.  She endorses polyphagia as well (with increasing strength training).  She says her polyphagia is at its worst at 3 pm.  Assessment/Plan:   1. Vitamin D deficiency Current vitamin D is 51.8, tested on 10/17/2019. At goal. Optimal goal > 50 ng/dL. Plan: Continue Vitamin D @50 ,000 IU every week with follow-up for routine testing of Vitamin D at least 2-3 times per year to avoid over-replacement.  2. Other hyperlipidemia  Lab Results  Component Value Date   CHOL 237 (H) 10/17/2019   HDL 52 10/17/2019   LDLCALC 157 (H) 10/17/2019   TRIG 154 (H) 10/17/2019   CHOLHDL 6.3 (H) 06/15/2015   Lab Results  Component Value Date   ALT 18 10/17/2019   AST 20 10/17/2019   ALKPHOS 94 10/17/2019   BILITOT 0.3 10/17/2019   Plan: Dietary changes: Increase soluble fiber. Decrease simple carbohydrates. Exercise changes: An average 40 minutes of moderate to vigorous-intensity aerobic activity 3 or 4 times per week. Lipid-lowering medications: None indicated yet, low ASCVD. Will recheck labs after 3 months or 10% weight loss.   3. Elevated blood pressure reading without diagnosis of hypertension Plan: Monitor home BP. Diet: Avoid buying foods that are: processed, frozen, or prepackaged to avoid excess salt. Recheck BP at next visit. The patient understands monitoring parameters and red flags.   BP  Readings from Last 3 Encounters:  12/18/19 (!) 150/105  11/26/19 (!) 141/101  10/31/19 (!) 127/93   4. At risk for heart disease Tammy Olsen was given approximately 15 minutes of coronary artery disease prevention counseling today. She is 44 y.o. female and has risk factors for heart disease including obesity metabolic syndrome. She will continue to focus on protein-rich, low simple carbohydrate foods. We reviewed the importance of hydration, regular exercise for stress reduction, and restorative sleep.   5. Class 2 severe obesity with serious comorbidity and body mass index (BMI) of 39.0 to 39.9 in adult, unspecified obesity type [facility])  Tammy Olsen is currently in the action stage of change. As such, her goal is to continue with weight loss efforts. She has agreed to the Category 3 Plan +100 calories.  Increase volume of foods for midday snack.   Exercise goals: For substantial health benefits, adults should do at least 150 minutes (2 hours and 30 minutes) a week of moderate-intensity, or 75 minutes (1 hour and 15 minutes) a week of vigorous-intensity aerobic physical activity, or an equivalent combination of moderate- and vigorous-intensity aerobic activity. Aerobic activity should be performed in episodes of at least 10 minutes, and preferably, it should be spread throughout the week.  Behavioral modification strategies: increasing lean protein intake, decreasing simple carbohydrates, increasing vegetables and better snacking choices.  Tammy Olsen has agreed to follow-up with our clinic in 2-3 weeks. She was informed of the importance of frequent follow-up visits to maximize her success with intensive lifestyle modifications for her multiple  health conditions.   Objective:   Blood pressure (!) 150/105, pulse 90, temperature 98.2 F (36.8 C), temperature source Oral, height 5\' 6"  (1.676 m), weight 244 lb (110.7 kg), SpO2 97 %, unknown if currently breastfeeding. Body mass index is 39.38 kg/m.  General:  Cooperative, alert, well developed, in no acute distress. HEENT: Conjunctivae and lids unremarkable. Cardiovascular: Regular rhythm.  Lungs: Normal work of breathing. Neurologic: No focal deficits.   Lab Results  Component Value Date   CREATININE 0.70 10/17/2019   BUN 8 10/17/2019   NA 140 10/17/2019   K 4.1 10/17/2019   CL 103 10/17/2019   CO2 23 10/17/2019   Lab Results  Component Value Date   ALT 18 10/17/2019   AST 20 10/17/2019   ALKPHOS 94 10/17/2019   BILITOT 0.3 10/17/2019   Lab Results  Component Value Date   HGBA1C 4.9 10/17/2019   HGBA1C 5.1 06/15/2015   HGBA1C 5.0 04/02/2014   Lab Results  Component Value Date   INSULIN 13.8 10/17/2019   Lab Results  Component Value Date   TSH 1.620 10/17/2019   Lab Results  Component Value Date   CHOL 237 (H) 10/17/2019   HDL 52 10/17/2019   LDLCALC 157 (H) 10/17/2019   TRIG 154 (H) 10/17/2019   CHOLHDL 6.3 (H) 06/15/2015   Lab Results  Component Value Date   WBC 9.2 10/17/2019   HGB 14.7 10/17/2019   HCT 43.7 10/17/2019   MCV 88 10/17/2019   PLT 364 10/17/2019   Lab Results  Component Value Date   IRON 81 10/17/2019   TIBC 357 10/17/2019   FERRITIN 101 10/17/2019   Attestation Statements:   Reviewed by clinician on day of visit: allergies, medications, problem list, medical history, surgical history, family history, social history, and previous encounter notes.  I, 12/17/2019, CMA, am acting as transcriptionist for Insurance claims handler, DO  I have reviewed the above documentation for accuracy and completeness, and I agree with the above. Helane Rima, DO

## 2020-01-09 ENCOUNTER — Ambulatory Visit (INDEPENDENT_AMBULATORY_CARE_PROVIDER_SITE_OTHER): Payer: 59 | Admitting: Family Medicine

## 2020-01-09 ENCOUNTER — Other Ambulatory Visit: Payer: Self-pay

## 2020-01-09 ENCOUNTER — Encounter (INDEPENDENT_AMBULATORY_CARE_PROVIDER_SITE_OTHER): Payer: Self-pay | Admitting: Family Medicine

## 2020-01-09 VITALS — BP 146/92 | HR 87 | Temp 97.9°F | Ht 66.0 in | Wt 241.0 lb

## 2020-01-09 DIAGNOSIS — Z9189 Other specified personal risk factors, not elsewhere classified: Secondary | ICD-10-CM

## 2020-01-09 DIAGNOSIS — G4719 Other hypersomnia: Secondary | ICD-10-CM | POA: Diagnosis not present

## 2020-01-09 DIAGNOSIS — I10 Essential (primary) hypertension: Secondary | ICD-10-CM

## 2020-01-09 DIAGNOSIS — Z6839 Body mass index (BMI) 39.0-39.9, adult: Secondary | ICD-10-CM

## 2020-01-09 MED ORDER — AMLODIPINE BESYLATE 5 MG PO TABS: 5.0000 mg | ORAL_TABLET | Freq: Every day | ORAL | 0 refills | Status: DC

## 2020-01-12 NOTE — Progress Notes (Signed)
Chief Complaint:   OBESITY Tammy Olsen is here to discuss her progress with her obesity treatment plan along with follow-up of her obesity related diagnoses.   Body mass index is 38.9 kg/m.  Total weight loss percentage to date: -6.95%  Nutrition Plan: the Category 3 Plan.  Hunger is moderately controlled controlled. Cravings are moderately controlled controlled.   Assessment/Plan:   1. Essential hypertension Tammy Olsen has been monitoring her blood pressure at home. They are consistent with the numbers seen in the office. After discussion, patient would like to start below medication. Expectations, risks, and potential side effects reviewed. Plan: Monitor home BP. Diet: Avoid buying foods that are: processed, frozen, or prepackaged to avoid excess salt. The patient understands monitoring parameters and red flags.   BP Readings from Last 3 Encounters:  01/09/20 (!) 146/92  12/18/19 (!) 150/105  11/26/19 (!) 141/101   - amLODipine (NORVASC) 5 MG tablet; Take 1 tablet (5 mg total) by mouth daily.  Dispense: 30 tablet; Refill: 0  2. Excessive daytime sleepiness Tammy Olsen endorses excessive sleepiness during the day. Her DBP is consistently high. Will have her evaluated by Neurology for possible sleep apnea. We will continue to monitor symptoms as they relate to her weight loss journey.  - Ambulatory referral to Sleep Studies  3. At risk for obstructive sleep apnea Tammy Olsen was given approximately 15 minutes of coronary artery disease prevention counseling today. She is 44 y.o. female and has risk factors for obstructive sleep apnea including obesity and diastolic HTN. We discussed intensive lifestyle modifications today with an emphasis on specific weight loss instructions and strategies.  4. Class 2 severe obesity with serious comorbidity and body mass index (BMI) of 39.0 to 39.9 in adult, unspecified obesity type [facility]  Tammy Olsen is currently in the action stage of change. As such, her goal is to  continue with weight loss efforts.   Nutrition goals: She has agreed to the Category 3 Plan.   Exercise goals: For substantial health benefits, adults should do at least 150 minutes (2 hours and 30 minutes) a week of moderate-intensity, or 75 minutes (1 hour and 15 minutes) a week of vigorous-intensity aerobic physical activity, or an equivalent combination of moderate- and vigorous-intensity aerobic activity. Aerobic activity should be performed in episodes of at least 10 minutes, and preferably, it should be spread throughout the week. Adults should also include muscle-strengthening activities that involve all major muscle groups on 2 or more days a week.  Behavioral modification strategies: increasing lean protein intake, increasing water intake and decreasing sodium intake.  Tammy Olsen has agreed to follow-up with our clinic in 3 weeks. She was informed of the importance of frequent follow-up visits to maximize her success with intensive lifestyle modifications for her multiple health conditions.   Objective:   Blood pressure (!) 146/92, pulse 87, temperature 97.9 F (36.6 C), temperature source Oral, height 5\' 6"  (1.676 m), weight 241 lb (109.3 kg), SpO2 97 %, unknown if currently breastfeeding. Body mass index is 38.9 kg/m.  General: Cooperative, alert, well developed, in no acute distress. HEENT: Conjunctivae and lids unremarkable. Cardiovascular: Regular rhythm.  Lungs: Normal work of breathing. Neurologic: No focal deficits.   Lab Results  Component Value Date   CREATININE 0.70 10/17/2019   BUN 8 10/17/2019   NA 140 10/17/2019   K 4.1 10/17/2019   CL 103 10/17/2019   CO2 23 10/17/2019   Lab Results  Component Value Date   ALT 18 10/17/2019   AST 20 10/17/2019  ALKPHOS 94 10/17/2019   BILITOT 0.3 10/17/2019   Lab Results  Component Value Date   HGBA1C 4.9 10/17/2019   HGBA1C 5.1 06/15/2015   HGBA1C 5.0 04/02/2014   Lab Results  Component Value Date   INSULIN 13.8  10/17/2019   Lab Results  Component Value Date   TSH 1.620 10/17/2019   Lab Results  Component Value Date   CHOL 237 (H) 10/17/2019   HDL 52 10/17/2019   LDLCALC 157 (H) 10/17/2019   TRIG 154 (H) 10/17/2019   CHOLHDL 6.3 (H) 06/15/2015   Lab Results  Component Value Date   WBC 9.2 10/17/2019   HGB 14.7 10/17/2019   HCT 43.7 10/17/2019   MCV 88 10/17/2019   PLT 364 10/17/2019   Lab Results  Component Value Date   IRON 81 10/17/2019   TIBC 357 10/17/2019   FERRITIN 101 10/17/2019   Attestation Statements:   Reviewed by clinician on day of visit: allergies, medications, problem list, medical history, surgical history, family history, social history, and previous encounter notes.

## 2020-01-14 ENCOUNTER — Encounter (INDEPENDENT_AMBULATORY_CARE_PROVIDER_SITE_OTHER): Payer: Self-pay | Admitting: Family Medicine

## 2020-01-14 DIAGNOSIS — I10 Essential (primary) hypertension: Secondary | ICD-10-CM

## 2020-01-14 NOTE — Telephone Encounter (Signed)
Please advise 

## 2020-01-15 MED ORDER — LOSARTAN POTASSIUM 25 MG PO TABS: 25.0000 mg | ORAL_TABLET | Freq: Every day | ORAL | 0 refills | Status: DC

## 2020-01-28 ENCOUNTER — Ambulatory Visit (INDEPENDENT_AMBULATORY_CARE_PROVIDER_SITE_OTHER): Payer: 59 | Admitting: Family Medicine

## 2020-01-28 ENCOUNTER — Other Ambulatory Visit: Payer: Self-pay

## 2020-01-28 ENCOUNTER — Encounter (INDEPENDENT_AMBULATORY_CARE_PROVIDER_SITE_OTHER): Payer: Self-pay | Admitting: Family Medicine

## 2020-01-28 VITALS — BP 140/60 | HR 99 | Temp 97.9°F | Ht 66.0 in | Wt 239.0 lb

## 2020-01-28 DIAGNOSIS — G4719 Other hypersomnia: Secondary | ICD-10-CM | POA: Diagnosis not present

## 2020-01-28 DIAGNOSIS — Z6838 Body mass index (BMI) 38.0-38.9, adult: Secondary | ICD-10-CM

## 2020-01-28 DIAGNOSIS — F419 Anxiety disorder, unspecified: Secondary | ICD-10-CM | POA: Insufficient documentation

## 2020-01-28 DIAGNOSIS — I10 Essential (primary) hypertension: Secondary | ICD-10-CM | POA: Diagnosis not present

## 2020-01-28 DIAGNOSIS — E88819 Insulin resistance, unspecified: Secondary | ICD-10-CM | POA: Insufficient documentation

## 2020-01-28 DIAGNOSIS — R632 Polyphagia: Secondary | ICD-10-CM

## 2020-01-28 DIAGNOSIS — Z9189 Other specified personal risk factors, not elsewhere classified: Secondary | ICD-10-CM

## 2020-01-28 DIAGNOSIS — E782 Mixed hyperlipidemia: Secondary | ICD-10-CM | POA: Insufficient documentation

## 2020-01-28 DIAGNOSIS — E8881 Metabolic syndrome: Secondary | ICD-10-CM | POA: Insufficient documentation

## 2020-01-28 MED ORDER — SAXENDA 18 MG/3ML ~~LOC~~ SOPN: 3.0000 mg | PEN_INJECTOR | SUBCUTANEOUS | 0 refills | Status: DC

## 2020-01-28 NOTE — Progress Notes (Signed)
Chief Complaint:   OBESITY Tammy Olsen is here to discuss her progress with her obesity treatment plan along with follow-up of her obesity related diagnoses.   Today's visit was #: 6 Starting weight: 259 lbs Starting date: 10/17/2019 Today's weight: 239 lbs Today's date: 01/28/2020 Total lbs lost to date: 20 lbs Body mass index is 38.58 kg/m.  Total weight loss percentage to date: -7.72%  Interim History: Tammy Olsen started taking losartan last week.  She reports one episode of orthostasis (felt fine with BP of 178/114).  She says she has been playing phone tag regarding her sleep study.  She has a history of taking diamox.   Nutrition Plan: practicing portion control and making smarter food choices, such as increasing vegetables and decreasing simple carbohydrates.  Activity: Strength training and walking for 60 minutes 7 times per week.  Assessment/Plan:   1. Polyphagia She will continue to focus on protein-rich, low simple carbohydrate foods. We reviewed the importance of hydration, regular exercise for stress reduction, and restorative sleep.  - Start Liraglutide -Weight Management (SAXENDA) 18 MG/3ML SOPN; Inject 3 mg into the skin once a week.  Dispense: 15 mL; Refill: 0  2. Essential hypertension Improving, but not optimized. Medications: Losartan 25 mg daily.   Plan:  Monitor closely and continue Losartan.  Avoid buying foods that are: processed, frozen, or prepackaged to avoid excess salt. We will continue to monitor symptoms as they relate to her weight loss journey.  BP Readings from Last 3 Encounters:  01/28/20 140/60  01/09/20 (!) 146/92  12/18/19 (!) 150/105   Lab Results  Component Value Date   CREATININE 0.70 10/17/2019   3. Excessive daytime sleepiness Tammy Olsen will call Neurology back to schedule sleep evaluation.  4. At risk for obstructive sleep apnea Tammy Olsen was given approximately 15 minutes of coronary artery disease prevention counseling today. She is 44  y.o. female and has risk factors for obstructive sleep apnea including obesity and elevated BP. We discussed intensive lifestyle modifications today with an emphasis on specific weight loss instructions and strategies.  5. Class 2 severe obesity with serious comorbidity and body mass index (BMI) of 38.0 to 38.9 in adult, unspecified obesity type [facility])  Course: Tammy Olsen is currently in the action stage of change. As such, her goal is to continue with weight loss efforts.   Nutrition goals: She has agreed to practicing portion control and making smarter food choices, such as increasing vegetables and decreasing simple carbohydrates.   Exercise goals: For substantial health benefits, adults should do at least 150 minutes (2 hours and 30 minutes) a week of moderate-intensity, or 75 minutes (1 hour and 15 minutes) a week of vigorous-intensity aerobic physical activity, or an equivalent combination of moderate- and vigorous-intensity aerobic activity. Aerobic activity should be performed in episodes of at least 10 minutes, and preferably, it should be spread throughout the week.  Behavioral modification strategies: increasing water intake and decreasing sodium intake.  Tammy Olsen has agreed to follow-up with our clinic in 3 weeks. She was informed of the importance of frequent follow-up visits to maximize her success with intensive lifestyle modifications for her multiple health conditions.   Objective:   Blood pressure 140/60, pulse 99, temperature 97.9 F (36.6 C), height 5\' 6"  (1.676 m), weight 239 lb (108.4 kg), SpO2 100 %, unknown if currently breastfeeding. Body mass index is 38.58 kg/m.  General: Cooperative, alert, well developed, in no acute distress. HEENT: Conjunctivae and lids unremarkable. Cardiovascular: Regular rhythm.  Lungs: Normal work  of breathing. Neurologic: No focal deficits.   Lab Results  Component Value Date   CREATININE 0.70 10/17/2019   BUN 8 10/17/2019   NA 140 10/17/2019    K 4.1 10/17/2019   CL 103 10/17/2019   CO2 23 10/17/2019   Lab Results  Component Value Date   ALT 18 10/17/2019   AST 20 10/17/2019   ALKPHOS 94 10/17/2019   BILITOT 0.3 10/17/2019   Lab Results  Component Value Date   HGBA1C 4.9 10/17/2019   HGBA1C 5.1 06/15/2015   HGBA1C 5.0 04/02/2014   Lab Results  Component Value Date   INSULIN 13.8 10/17/2019   Lab Results  Component Value Date   TSH 1.620 10/17/2019   Lab Results  Component Value Date   CHOL 237 (H) 10/17/2019   HDL 52 10/17/2019   LDLCALC 157 (H) 10/17/2019   TRIG 154 (H) 10/17/2019   CHOLHDL 6.3 (H) 06/15/2015   Lab Results  Component Value Date   WBC 9.2 10/17/2019   HGB 14.7 10/17/2019   HCT 43.7 10/17/2019   MCV 88 10/17/2019   PLT 364 10/17/2019   Lab Results  Component Value Date   IRON 81 10/17/2019   TIBC 357 10/17/2019   FERRITIN 101 10/17/2019   Attestation Statements:   Reviewed by clinician on day of visit: allergies, medications, problem list, medical history, surgical history, family history, social history, and previous encounter notes.  I, Insurance claims handler, CMA, am acting as transcriptionist for Helane Rima, DO  I have reviewed the above documentation for accuracy and completeness, and I agree with the above. Helane Rima, DO

## 2020-02-10 ENCOUNTER — Telehealth (INDEPENDENT_AMBULATORY_CARE_PROVIDER_SITE_OTHER): Payer: Self-pay

## 2020-02-10 NOTE — Telephone Encounter (Signed)
Pt called stating that the pharmacy [facility]. Doesn't provide the dosage that was sent , please give the pharmacy a call at [phone].

## 2020-02-10 NOTE — Telephone Encounter (Signed)
Last OV with Dr Wallace 

## 2020-02-11 ENCOUNTER — Encounter (INDEPENDENT_AMBULATORY_CARE_PROVIDER_SITE_OTHER): Payer: Self-pay | Admitting: Family Medicine

## 2020-02-11 NOTE — Telephone Encounter (Signed)
RX for Bernie Covey was sent as weekly instead of daily. RX corrected with pharmacy.

## 2020-02-14 ENCOUNTER — Other Ambulatory Visit (INDEPENDENT_AMBULATORY_CARE_PROVIDER_SITE_OTHER): Payer: Self-pay | Admitting: Family Medicine

## 2020-02-14 DIAGNOSIS — I10 Essential (primary) hypertension: Secondary | ICD-10-CM

## 2020-02-17 NOTE — Telephone Encounter (Signed)
Dr.Wallace °

## 2020-02-19 ENCOUNTER — Other Ambulatory Visit: Payer: Self-pay

## 2020-02-19 ENCOUNTER — Ambulatory Visit (INDEPENDENT_AMBULATORY_CARE_PROVIDER_SITE_OTHER): Payer: 59 | Admitting: Family Medicine

## 2020-02-19 ENCOUNTER — Encounter (INDEPENDENT_AMBULATORY_CARE_PROVIDER_SITE_OTHER): Payer: Self-pay | Admitting: Family Medicine

## 2020-02-19 ENCOUNTER — Other Ambulatory Visit (INDEPENDENT_AMBULATORY_CARE_PROVIDER_SITE_OTHER): Payer: Self-pay | Admitting: Family Medicine

## 2020-02-19 VITALS — BP 140/92 | HR 94 | Temp 98.1°F | Ht 66.0 in | Wt 237.0 lb

## 2020-02-19 DIAGNOSIS — Z9189 Other specified personal risk factors, not elsewhere classified: Secondary | ICD-10-CM

## 2020-02-19 DIAGNOSIS — I10 Essential (primary) hypertension: Secondary | ICD-10-CM | POA: Diagnosis not present

## 2020-02-19 DIAGNOSIS — E782 Mixed hyperlipidemia: Secondary | ICD-10-CM | POA: Diagnosis not present

## 2020-02-19 DIAGNOSIS — G4719 Other hypersomnia: Secondary | ICD-10-CM

## 2020-02-19 DIAGNOSIS — Z6838 Body mass index (BMI) 38.0-38.9, adult: Secondary | ICD-10-CM

## 2020-02-19 DIAGNOSIS — R632 Polyphagia: Secondary | ICD-10-CM | POA: Diagnosis not present

## 2020-02-19 MED ORDER — LOSARTAN POTASSIUM 25 MG PO TABS: 25.0000 mg | ORAL_TABLET | Freq: Every day | ORAL | 3 refills | Status: DC

## 2020-02-19 NOTE — Telephone Encounter (Signed)
Dr.Wallace °

## 2020-02-20 NOTE — Progress Notes (Signed)
Chief Complaint:   OBESITY Tammy Olsen is here to discuss her progress with her obesity treatment plan along with follow-up of her obesity related diagnoses.   Today's visit was #: 7 Starting weight: 259 lbs Starting date: 10/17/2019 Today's weight: 237 lbs Today's date: 02/19/2020 Total lbs lost to date: 22 lbs Body mass index is 38.25 kg/m.  Total weight loss percentage to date: -8.49%  Interim History: Tammy Olsen says that she has been having irregular menses and is premenstrual, which is causing increased hunger and irritability.  She is also having night sweats.  She has been on Junel for 7 years.  She has an appointment with her PCP, Dr. Tenny Craw, on January 12 for a CPE.  Will send office note and labs. Noted: AOMs not covered by her insurance. Nutrition Plan: practicing portion control and making smarter food choices, such as increasing vegetables and decreasing simple carbohydrates for 95% of the time.  Hunger is poorly controlled. Cravings are poorly controlled.  Activity: Strength training and walking for 30+ minutes 2-3 times per week.  Assessment/Plan:   1. Polyphagia Not controlled. She will continue to focus on protein-rich, low simple carbohydrate foods. We reviewed the importance of hydration, regular exercise for stress reduction, and restorative sleep.  AOM's are not covered by insurance.  2. Essential hypertension Not at goal. Medications: Cozaar 25 mg daily.   Plan: Avoid buying foods that are: processed, frozen, or prepackaged to avoid excess salt. We will continue to monitor symptoms as they relate to her weight loss journey.  BP Readings from Last 3 Encounters:  02/19/20 (!) 140/92  01/28/20 140/60  01/09/20 (!) 146/92   Lab Results  Component Value Date   CREATININE 0.70 10/17/2019   - Refill losartan (COZAAR) 25 MG tablet; Take 1 tablet (25 mg total) by mouth daily.  Dispense: 90 tablet; Refill: 3  3. Excessive daytime sleepiness [name] is scheduled for an  appointment with Sleep Medicine on 02/25/2020. We will continue to monitor symptoms as they relate to her weight loss journey.  4. Mixed hyperlipidemia Course: Uncontrolled.  Lipid-lowering medications: None.  She is due for labs.  Plan: Dietary changes: Increase soluble fiber. Decrease simple carbohydrates. Exercise changes: An average 40 minutes of moderate to vigorous-intensity aerobic activity 3 or 4 times per week.   Lab Results  Component Value Date   CHOL 237 (H) 10/17/2019   HDL 52 10/17/2019   LDLCALC 157 (H) 10/17/2019   TRIG 154 (H) 10/17/2019   CHOLHDL 6.3 (H) 06/15/2015   Lab Results  Component Value Date   ALT 18 10/17/2019   AST 20 10/17/2019   ALKPHOS 94 10/17/2019   BILITOT 0.3 10/17/2019   The 10-year ASCVD risk score Denman George DC Jr., et al., 2013) is: 1.6%   Values used to calculate the score:     Age: 33 years     Sex: Female     Is Non-Hispanic African American: No     Diabetic: No     Tobacco smoker: No     Systolic Blood Pressure: 140 mmHg     Is BP treated: Yes     HDL Cholesterol: 52 mg/dL     Total Cholesterol: 237 mg/dL  5. At risk for heart disease Tammy Olsen was given approximately 8 minutes of coronary artery disease prevention counseling today. She is 44 y.o. female and has risk factors for heart disease including obesity and HLD. We discussed intensive lifestyle modifications today with an emphasis on specific weight loss  instructions and strategies. Repetitive spaced learning was employed today to elicit superior memory formation and behavioral change.  6. Class 2 severe obesity with serious comorbidity and body mass index (BMI) of 38.0 to 38.9 in adult, unspecified obesity type [facility])  Course: Tammy Olsen is currently in the action stage of change. As such, her goal is to continue with weight loss efforts.   Nutrition goals: She has agreed to practicing portion control and making smarter food choices, such as increasing vegetables and decreasing simple  carbohydrates.   Exercise goals: For substantial health benefits, adults should do at least 150 minutes (2 hours and 30 minutes) a week of moderate-intensity, or 75 minutes (1 hour and 15 minutes) a week of vigorous-intensity aerobic physical activity, or an equivalent combination of moderate- and vigorous-intensity aerobic activity. Aerobic activity should be performed in episodes of at least 10 minutes, and preferably, it should be spread throughout the week.  Behavioral modification strategies: increasing lean protein intake, decreasing simple carbohydrates, increasing vegetables, increasing water intake, dealing with family or coworker sabotage, travel eating strategies and holiday eating strategies .  [name] has agreed to follow-up with our clinic in 4 weeks. She was informed of the importance of frequent follow-up visits to maximize her success with intensive lifestyle modifications for her multiple health conditions.   Objective:   Blood pressure (!) 140/92, pulse 94, temperature 98.1 F (36.7 C), temperature source Oral, height 5\' 6"  (1.676 m), weight 237 lb (107.5 kg), SpO2 97 %, unknown if currently breastfeeding. Body mass index is 38.25 kg/m.  General: Cooperative, alert, well developed, in no acute distress. HEENT: Conjunctivae and lids unremarkable. Cardiovascular: Regular rhythm.  Lungs: Normal work of breathing. Neurologic: No focal deficits.   Lab Results  Component Value Date   CREATININE 0.70 10/17/2019   BUN 8 10/17/2019   NA 140 10/17/2019   K 4.1 10/17/2019   CL 103 10/17/2019   CO2 23 10/17/2019   Lab Results  Component Value Date   ALT 18 10/17/2019   AST 20 10/17/2019   ALKPHOS 94 10/17/2019   BILITOT 0.3 10/17/2019   Lab Results  Component Value Date   HGBA1C 4.9 10/17/2019   HGBA1C 5.1 06/15/2015   HGBA1C 5.0 04/02/2014   Lab Results  Component Value Date   INSULIN 13.8 10/17/2019   Lab Results  Component Value Date   TSH 1.620 10/17/2019    Lab Results  Component Value Date   CHOL 237 (H) 10/17/2019   HDL 52 10/17/2019   LDLCALC 157 (H) 10/17/2019   TRIG 154 (H) 10/17/2019   CHOLHDL 6.3 (H) 06/15/2015   Lab Results  Component Value Date   WBC 9.2 10/17/2019   HGB 14.7 10/17/2019   HCT 43.7 10/17/2019   MCV 88 10/17/2019   PLT 364 10/17/2019   Lab Results  Component Value Date   IRON 81 10/17/2019   TIBC 357 10/17/2019   FERRITIN 101 10/17/2019   Attestation Statements:   Reviewed by clinician on day of visit: allergies, medications, problem list, medical history, surgical history, family history, social history, and previous encounter notes.  I, 12/17/2019, CMA, am acting as transcriptionist for Insurance claims handler, DO  I have reviewed the above documentation for accuracy and completeness, and I agree with the above. Helane Rima, DO

## 2020-02-25 ENCOUNTER — Institutional Professional Consult (permissible substitution): Payer: 59 | Admitting: Neurology

## 2020-03-23 ENCOUNTER — Ambulatory Visit (INDEPENDENT_AMBULATORY_CARE_PROVIDER_SITE_OTHER): Payer: 59 | Admitting: Family Medicine

## 2020-03-23 ENCOUNTER — Encounter (INDEPENDENT_AMBULATORY_CARE_PROVIDER_SITE_OTHER): Payer: Self-pay | Admitting: Family Medicine

## 2020-03-23 ENCOUNTER — Other Ambulatory Visit: Payer: Self-pay

## 2020-03-23 VITALS — BP 144/99 | HR 90 | Temp 98.0°F | Ht 66.0 in | Wt 236.0 lb

## 2020-03-23 DIAGNOSIS — G4719 Other hypersomnia: Secondary | ICD-10-CM | POA: Diagnosis not present

## 2020-03-23 DIAGNOSIS — R635 Abnormal weight gain: Secondary | ICD-10-CM | POA: Insufficient documentation

## 2020-03-23 DIAGNOSIS — I10 Essential (primary) hypertension: Secondary | ICD-10-CM

## 2020-03-23 DIAGNOSIS — F419 Anxiety disorder, unspecified: Secondary | ICD-10-CM

## 2020-03-23 DIAGNOSIS — Z6838 Body mass index (BMI) 38.0-38.9, adult: Secondary | ICD-10-CM

## 2020-03-23 DIAGNOSIS — K589 Irritable bowel syndrome without diarrhea: Secondary | ICD-10-CM | POA: Insufficient documentation

## 2020-03-23 DIAGNOSIS — E782 Mixed hyperlipidemia: Secondary | ICD-10-CM | POA: Diagnosis not present

## 2020-03-23 DIAGNOSIS — R1013 Epigastric pain: Secondary | ICD-10-CM | POA: Insufficient documentation

## 2020-03-23 DIAGNOSIS — R140 Abdominal distension (gaseous): Secondary | ICD-10-CM | POA: Insufficient documentation

## 2020-03-23 DIAGNOSIS — Z9189 Other specified personal risk factors, not elsewhere classified: Secondary | ICD-10-CM | POA: Diagnosis not present

## 2020-03-23 DIAGNOSIS — K5904 Chronic idiopathic constipation: Secondary | ICD-10-CM | POA: Insufficient documentation

## 2020-03-23 DIAGNOSIS — K219 Gastro-esophageal reflux disease without esophagitis: Secondary | ICD-10-CM | POA: Insufficient documentation

## 2020-03-23 MED ORDER — LOSARTAN POTASSIUM 50 MG PO TABS: 50.0000 mg | ORAL_TABLET | Freq: Every day | ORAL | 3 refills | Status: DC

## 2020-03-23 MED ORDER — FLUOXETINE HCL 20 MG PO TABS: ORAL_TABLET | ORAL | 3 refills | Status: DC

## 2020-03-23 MED ORDER — VENLAFAXINE HCL 75 MG PO TABS: ORAL_TABLET | ORAL | 1 refills | Status: DC

## 2020-03-25 NOTE — Progress Notes (Signed)
Chief Complaint:   OBESITY Tammy Olsen is here to discuss her progress with her obesity treatment plan along with follow-up of her obesity related diagnoses.   Today's visit was #: 8 Starting weight: 259 lbs Starting date: 10/17/2019 Today's weight: 236 lbs Today's date: 03/23/2020 Total lbs lost to date: 23 lbs Body mass index is 38.09 kg/m.  Total weight loss percentage to date: -8.88%  Interim History: Tammy Olsen will be seeing Dr. Vickey Huger on 03/30/2020.   Nutrition Plan: practicing portion control and making smarter food choices, such as increasing vegetables and decreasing simple carbohydrates for 85% of the time.  Activity: Strength training for 15-30 minutes 2-3 times per week.  Assessment/Plan:   1. Essential hypertension Difficult to control.  Tolerating losartan.   Plan:  Will increase losartan to 50 mg daily.  Check for OSA.  Transition Effexor to Prozac.  Consider OCP change.    BP Readings from Last 3 Encounters:  03/23/20 (!) 144/99  02/19/20 (!) 140/92  01/28/20 140/60   Lab Results  Component Value Date   CREATININE 0.70 10/17/2019   -Increase losartan (COZAAR) 50 MG tablet; Take 1 tablet (50 mg total) by mouth daily.  Dispense: 90 tablet; Refill: 3  2. Excessive daytime sleepiness Tammy Olsen has an upcoming appointment with Neurology. We will continue to monitor symptoms as they relate to her weight loss journey.  3. Mixed hyperlipidemia Plan: Dietary changes: Increase soluble fiber. Decrease simple carbohydrates. Exercise changes: An average 40 minutes of moderate to vigorous-intensity aerobic activity 3 or 4 times per week.   Lab Results  Component Value Date   CHOL 237 (H) 10/17/2019   HDL 52 10/17/2019   LDLCALC 157 (H) 10/17/2019   TRIG 154 (H) 10/17/2019   CHOLHDL 6.3 (H) 06/15/2015   Lab Results  Component Value Date   ALT 18 10/17/2019   AST 20 10/17/2019   ALKPHOS 94 10/17/2019   BILITOT 0.3 10/17/2019   The 10-year ASCVD risk score Denman George DC  Jr., et al., 2013) is: 1.7%   Values used to calculate the score:     Age: 45 years     Sex: Female     Is Non-Hispanic African American: No     Diabetic: No     Tobacco smoker: No     Systolic Blood Pressure: 144 mmHg     Is BP treated: Yes     HDL Cholesterol: 52 mg/dL     Total Cholesterol: 237 mg/dL  4. Anxiety with IBS Tammy Olsen does not feel that Effexor is very helpful.  May be contributing to elevated blood pressure.    Plan:  Will transition to SSRI to see if also helps IBS.  -Start FLUoxetine (PROZAC) 20 MG tablet; 0.5 tab x 1 week, then 1 tab daily.  Dispense: 30 tablet; Refill: 3 -Decrease venlafaxine (EFFEXOR) 75 MG tablet; 1.5 tab BID x 1 week, then 1 tab BID x 1 week, then 0.5 tab BID x 1 week, then off.  Dispense: 60 tablet; Refill: 1  5. At risk for side effect of medication Tammy Olsen was given approximately 15 minutes of drug side effect counseling today.  She is at risk for side effect due to transitioning off Effexor and starting Prozac.  We discussed side effect possibility and risk versus benefits. Tammy Olsen agreed to the medication and will contact this office if these side effects are intolerable.  6. Class 2 severe obesity with serious comorbidity and body mass index (BMI) of 38.0 to 38.9 in adult,  unspecified obesity type Tammy Olsen, [facility]  Course: Dovey is currently in the action stage of change. As such, her goal is to continue with weight loss efforts.   Nutrition goals: She has agreed to practicing portion control and making smarter food choices, such as increasing vegetables and decreasing simple carbohydrates.   Exercise goals: For substantial health benefits, adults should do at least 150 minutes (2 hours and 30 minutes) Tammy week of moderate-intensity, or 75 minutes (1 hour and 15 minutes) Tammy week of vigorous-intensity aerobic physical activity, or an equivalent combination of moderate- and vigorous-intensity aerobic activity. Aerobic activity should be performed in episodes of at  least 10 minutes, and preferably, it should be spread throughout the week.  Behavioral modification strategies: increasing lean protein intake, decreasing simple carbohydrates, increasing vegetables, increasing water intake and decreasing liquid calories.  Tammy Olsen has agreed to follow-up with our clinic in 3 weeks. She was informed of the importance of frequent follow-up visits to maximize her success with intensive lifestyle modifications for her multiple health conditions.   Objective:   Blood pressure (!) 144/99, pulse 90, temperature 98 F (36.7 C), temperature source Oral, height 5\' 6"  (1.676 m), weight 236 lb (107 kg), SpO2 97 %, unknown if currently breastfeeding. Body mass index is 38.09 kg/m.  General: Cooperative, alert, well developed, in no acute distress. HEENT: Conjunctivae and lids unremarkable. Cardiovascular: Regular rhythm.  Lungs: Normal work of breathing. Neurologic: No focal deficits.   Lab Results  Component Value Date   CREATININE 0.70 10/17/2019   BUN 8 10/17/2019   NA 140 10/17/2019   K 4.1 10/17/2019   CL 103 10/17/2019   CO2 23 10/17/2019   Lab Results  Component Value Date   ALT 18 10/17/2019   AST 20 10/17/2019   ALKPHOS 94 10/17/2019   BILITOT 0.3 10/17/2019   Lab Results  Component Value Date   HGBA1C 4.9 10/17/2019   HGBA1C 5.1 06/15/2015   HGBA1C 5.0 04/02/2014   Lab Results  Component Value Date   INSULIN 13.8 10/17/2019   Lab Results  Component Value Date   TSH 1.620 10/17/2019   Lab Results  Component Value Date   CHOL 237 (H) 10/17/2019   HDL 52 10/17/2019   LDLCALC 157 (H) 10/17/2019   TRIG 154 (H) 10/17/2019   CHOLHDL 6.3 (H) 06/15/2015   Lab Results  Component Value Date   WBC 9.2 10/17/2019   HGB 14.7 10/17/2019   HCT 43.7 10/17/2019   MCV 88 10/17/2019   PLT 364 10/17/2019   Lab Results  Component Value Date   IRON 81 10/17/2019   TIBC 357 10/17/2019   FERRITIN 101 10/17/2019   Attestation Statements:    Reviewed by clinician on day of visit: allergies, medications, problem list, medical history, surgical history, family history, social history, and previous encounter notes.  I, 12/17/2019, CMA, am acting as transcriptionist for Insurance claims handler, DO  I have reviewed the above documentation for accuracy and completeness, and I agree with the above. Helane Rima, DO

## 2020-03-30 ENCOUNTER — Institutional Professional Consult (permissible substitution): Payer: 59 | Admitting: Neurology

## 2020-04-02 ENCOUNTER — Encounter (INDEPENDENT_AMBULATORY_CARE_PROVIDER_SITE_OTHER): Payer: Self-pay | Admitting: Family Medicine

## 2020-04-02 DIAGNOSIS — I10 Essential (primary) hypertension: Secondary | ICD-10-CM

## 2020-04-02 NOTE — Telephone Encounter (Signed)
Please review refill request 

## 2020-04-05 MED ORDER — LOSARTAN POTASSIUM 50 MG PO TABS: 50.0000 mg | ORAL_TABLET | Freq: Every day | ORAL | 3 refills | Status: DC

## 2020-04-08 ENCOUNTER — Ambulatory Visit (INDEPENDENT_AMBULATORY_CARE_PROVIDER_SITE_OTHER): Payer: 59 | Admitting: Family Medicine

## 2020-04-13 ENCOUNTER — Other Ambulatory Visit: Payer: Self-pay

## 2020-04-13 ENCOUNTER — Encounter (INDEPENDENT_AMBULATORY_CARE_PROVIDER_SITE_OTHER): Payer: Self-pay | Admitting: Family Medicine

## 2020-04-13 ENCOUNTER — Ambulatory Visit (INDEPENDENT_AMBULATORY_CARE_PROVIDER_SITE_OTHER): Payer: 59 | Admitting: Family Medicine

## 2020-04-13 VITALS — BP 146/100 | HR 82 | Temp 98.1°F | Ht 66.0 in | Wt 234.0 lb

## 2020-04-13 DIAGNOSIS — R61 Generalized hyperhidrosis: Secondary | ICD-10-CM | POA: Diagnosis not present

## 2020-04-13 DIAGNOSIS — F419 Anxiety disorder, unspecified: Secondary | ICD-10-CM | POA: Diagnosis not present

## 2020-04-13 DIAGNOSIS — Z9189 Other specified personal risk factors, not elsewhere classified: Secondary | ICD-10-CM

## 2020-04-13 DIAGNOSIS — I10 Essential (primary) hypertension: Secondary | ICD-10-CM

## 2020-04-13 DIAGNOSIS — Z6837 Body mass index (BMI) 37.0-37.9, adult: Secondary | ICD-10-CM

## 2020-04-15 NOTE — Progress Notes (Signed)
Chief Complaint:   OBESITY Tammy Olsen is here to discuss her progress with her obesity treatment plan along with follow-up of her obesity related diagnoses.   Today's visit was #: 9 Starting weight: 259 lbs Starting date: 10/17/2019 Today's weight: 234 lbs Today's date: 04/13/2020 Total lbs lost to date: 25 lbs Body mass index is 37.77 kg/m.  Total weight loss percentage to date: -9.65%  Interim History: Tammy Olsen says she is getting a Mirena IUD this week.  Her sleep appointment was canceled due to weather.  She says she has not been monitoring her blood pressure. Nutrition Plan: practicing portion control and making smarter food choices, such as increasing vegetables and decreasing simple carbohydrates for 90% of the time.  Activity: Strength training for 15-30 minutes 2-3 times per week.  Assessment/Plan:   1. Essential hypertension Elevated today.  Medications: losartan 50 mg daily.  GYN stopped OCP.    Plan: Will continue to monitor.  Recommend checking blood pressure at home and keeping alog.  BP Readings from Last 3 Encounters:  04/13/20 (!) 146/100  03/23/20 (!) 144/99  02/19/20 (!) 140/92   Lab Results  Component Value Date   CREATININE 0.70 10/17/2019   2. Night sweats Improving since working on medication changes.  3. Anxiety, with emotional eating Tammy Olsen is taking Prozac 20 mg daily.  She says she is still adjusting to the change from Effexor to Prozac.  No withdrawal symptoms.  She will take her last Effexor this week.  4. At risk for heart disease Due to Tammy Olsen's current state of health and medical condition(s), she is at a higher risk for heart disease.  This puts the patient at much greater risk to subsequently develop cardiopulmonary conditions that can significantly affect patient's quality of life in a negative manner.    At least 10 minutes were spent on counseling Tammy Olsen about these concerns today, and I stressed the importance of reversing risks factors of  obesity, especially truncal and visceral fat, hypertension, hyperlipidemia, and pre-diabetes.  The initial goal is to lose at least 5-10% of starting weight to help reduce these risk factors.  Counseling:  Intensive lifestyle modifications were discussed with Tammy Olsen as the most appropriate first line of treatment.  she will continue to work on diet, exercise, and weight loss efforts.  We will continue to reassess these conditions on a fairly regular basis in an attempt to decrease the patient's overall morbidity and mortality.  Evidence-based interventions for health behavior change were utilized today including the discussion of self monitoring techniques, problem-solving barriers, and SMART goal setting techniques.  Specifically, regarding patient's less desirable eating habits and patterns, we employed the technique of small changes when Tammy Olsen has not been able to fully commit to her prudent nutritional plan.  5. Class 2 severe obesity with serious comorbidity and body mass index (BMI) of 37.0 to 37.9 in adult, unspecified obesity type Tammy Olsen)  Course: Tammy Olsen is currently in the action stage of change. As such, her goal is to continue with weight loss efforts.   Nutrition goals: She has agreed to practicing portion control and making smarter food choices, such as increasing vegetables and decreasing simple carbohydrates.   Exercise goals: For substantial health benefits, adults should do at least 150 minutes (2 hours and 30 minutes) a week of moderate-intensity, or 75 minutes (1 hour and 15 minutes) a week of vigorous-intensity aerobic physical activity, or an equivalent combination of moderate- and vigorous-intensity aerobic activity. Aerobic activity should be performed in  episodes of at least 10 minutes, and preferably, it should be spread throughout the week.  Behavioral modification strategies: increasing lean protein intake, decreasing simple carbohydrates, increasing vegetables and increasing water  intake.  Tammy Olsen has agreed to follow-up with our clinic in 2-3 weeks. She was informed of the importance of frequent follow-up visits to maximize her success with intensive lifestyle modifications for her multiple health conditions.   Objective:   Blood pressure (!) 146/100, pulse 82, temperature 98.1 F (36.7 C), temperature source Oral, height 5\' 6"  (1.676 m), weight 234 lb (106.1 kg), SpO2 97 %, unknown if currently breastfeeding. Body mass index is 37.77 kg/m.  General: Cooperative, alert, well developed, in no acute distress. HEENT: Conjunctivae and lids unremarkable. Cardiovascular: Regular rhythm.  Lungs: Normal work of breathing. Neurologic: No focal deficits.   Lab Results  Component Value Date   CREATININE 0.70 10/17/2019   BUN 8 10/17/2019   NA 140 10/17/2019   K 4.1 10/17/2019   CL 103 10/17/2019   CO2 23 10/17/2019   Lab Results  Component Value Date   ALT 18 10/17/2019   AST 20 10/17/2019   ALKPHOS 94 10/17/2019   BILITOT 0.3 10/17/2019   Lab Results  Component Value Date   HGBA1C 4.9 10/17/2019   HGBA1C 5.1 06/15/2015   HGBA1C 5.0 04/02/2014   Lab Results  Component Value Date   INSULIN 13.8 10/17/2019   Lab Results  Component Value Date   TSH 1.620 10/17/2019   Lab Results  Component Value Date   CHOL 237 (H) 10/17/2019   HDL 52 10/17/2019   LDLCALC 157 (H) 10/17/2019   TRIG 154 (H) 10/17/2019   CHOLHDL 6.3 (H) 06/15/2015   Lab Results  Component Value Date   WBC 9.2 10/17/2019   HGB 14.7 10/17/2019   HCT 43.7 10/17/2019   MCV 88 10/17/2019   PLT 364 10/17/2019   Lab Results  Component Value Date   IRON 81 10/17/2019   TIBC 357 10/17/2019   FERRITIN 101 10/17/2019   Attestation Statements:   Reviewed by clinician on day of visit: allergies, medications, problem list, medical history, surgical history, family history, social history, and previous encounter notes.  I, 12/17/2019, CMA, am acting as transcriptionist for Insurance claims handler, DO  I have reviewed the above documentation for accuracy and completeness, and I agree with the above. Helane Rima, DO

## 2020-04-27 ENCOUNTER — Ambulatory Visit (INDEPENDENT_AMBULATORY_CARE_PROVIDER_SITE_OTHER): Payer: 59 | Admitting: Family Medicine

## 2020-04-28 ENCOUNTER — Encounter (INDEPENDENT_AMBULATORY_CARE_PROVIDER_SITE_OTHER): Payer: Self-pay | Admitting: Family Medicine

## 2020-05-11 MED ORDER — FLUOXETINE HCL 40 MG PO CAPS: 40.0000 mg | ORAL_CAPSULE | Freq: Every day | ORAL | 0 refills | Status: DC

## 2020-05-13 ENCOUNTER — Encounter (INDEPENDENT_AMBULATORY_CARE_PROVIDER_SITE_OTHER): Payer: Self-pay

## 2020-05-13 ENCOUNTER — Ambulatory Visit (INDEPENDENT_AMBULATORY_CARE_PROVIDER_SITE_OTHER): Payer: 59 | Admitting: Family Medicine

## 2020-05-26 ENCOUNTER — Ambulatory Visit (INDEPENDENT_AMBULATORY_CARE_PROVIDER_SITE_OTHER): Payer: 59 | Admitting: Family Medicine

## 2020-05-26 ENCOUNTER — Encounter (INDEPENDENT_AMBULATORY_CARE_PROVIDER_SITE_OTHER): Payer: Self-pay | Admitting: Family Medicine

## 2020-05-26 ENCOUNTER — Other Ambulatory Visit: Payer: Self-pay

## 2020-05-26 VITALS — BP 139/87 | HR 81 | Temp 97.5°F | Ht 66.0 in | Wt 240.0 lb

## 2020-05-26 DIAGNOSIS — Z9189 Other specified personal risk factors, not elsewhere classified: Secondary | ICD-10-CM | POA: Diagnosis not present

## 2020-05-26 DIAGNOSIS — F411 Generalized anxiety disorder: Secondary | ICD-10-CM

## 2020-05-26 DIAGNOSIS — Z6838 Body mass index (BMI) 38.0-38.9, adult: Secondary | ICD-10-CM

## 2020-05-26 DIAGNOSIS — F3289 Other specified depressive episodes: Secondary | ICD-10-CM

## 2020-05-26 DIAGNOSIS — K219 Gastro-esophageal reflux disease without esophagitis: Secondary | ICD-10-CM

## 2020-05-26 MED ORDER — FLUOXETINE HCL 40 MG PO CAPS: 40.0000 mg | ORAL_CAPSULE | Freq: Every day | ORAL | 0 refills | Status: DC

## 2020-05-26 MED ORDER — PANTOPRAZOLE SODIUM 40 MG PO TBEC: 40.0000 mg | DELAYED_RELEASE_TABLET | Freq: Every day | ORAL | 0 refills | Status: DC

## 2020-06-01 ENCOUNTER — Ambulatory Visit (INDEPENDENT_AMBULATORY_CARE_PROVIDER_SITE_OTHER): Payer: 59 | Admitting: Family Medicine

## 2020-06-02 NOTE — Progress Notes (Signed)
Chief Complaint:   OBESITY Tammy Olsen is here to discuss her progress with her obesity treatment plan along with follow-up of her obesity related diagnoses.   Today's visit was #: 10 Starting weight: 259 lbs Starting date: 10/17/2019 Today's weight: 240 lbs Today's date: 05/26/2020 Total lbs lost to date: 19 lbs Body mass index is 38.74 kg/m.  Total weight loss percentage to date: -7.34%  Interim History:  Tammy Olsen says she has had increased fatigue and has been bloated.  She says that the evenings are the worst time for increased eating.  Current Meal Plan: practicing portion control and making smarter food choices, such as increasing vegetables and decreasing simple carbohydrates for 80-85% of the time.  Current Exercise Plan: Strength training and walking for 15-30 minutes 2 times per week.  Assessment/Plan:   1. Gastroesophageal reflux disease, unspecified whether esophagitis present [name] is taking Protonix 40 mg daily for GERD.  Plan:  Continue Protonix.  Will refill today.  We reviewed the diagnosis of GERD and importance of treatment. We discussed "red flag" symptoms and the importance of follow up if symptoms persisted despite treatment. We reviewed non-pharmacologic management of GERD symptoms: including: caffeine reduction, dietary changes, elevate HOB, NPO after supper, reduction of alcohol intake, tobacco cessation, and weight loss.  - Refill pantoprazole (PROTONIX) 40 MG tablet; Take 1 tablet (40 mg total) by mouth daily.  Dispense: 30 tablet; Refill: 0  2. GAD (generalized anxiety disorder) Fadumo is taking Prozac 40 mg daily for anxiety.    Plan:  Continue Prozac.  Will refill today.  Behavior modification techniques were discussed today to help Tammy Olsen deal with her anxiety.    - Refill FLUoxetine (PROZAC) 40 MG capsule; Take 1 capsule (40 mg total) by mouth daily.  Dispense: 90 capsule; Refill: 0  3. Other depression, with emotional eating Not at goal. Medication:  Prozac 40 mg daily.   Plan:  Continue Prozac.  Behavior modification techniques were discussed today to help deal with emotional/non-hunger eating behaviors. Patient was referred to Dr. Dewaine Olsen, our Bariatric Psychologist, for evaluation due to her elevated PHQ-9 score and significant struggles with emotional eating.  4. At risk for heart disease Due to Delayni's current state of health and medical condition(s), she is at a higher risk for heart disease.  This puts the patient at much greater risk to subsequently develop cardiopulmonary conditions that can significantly affect patient's quality of life in a negative manner.    At least 8 minutes were spent on counseling Tammy Olsen about these concerns today. Evidence-based interventions for health behavior change were utilized today including the discussion of self monitoring techniques, problem-solving barriers, and SMART goal setting techniques.  Specifically, regarding patient's less desirable eating habits and patterns, we employed the technique of small changes when Tammy Olsen has not been able to fully commit to her prudent nutritional plan.  5. Class 2 severe obesity with serious comorbidity and body mass index (BMI) of 38.0 to 38.9 in adult, unspecified obesity type [facility])  Course: Tammy Olsen is currently in the action stage of change. As such, her goal is to continue with weight loss efforts.   Nutrition goals: She has agreed to practicing portion control and making smarter food choices, such as increasing vegetables and decreasing simple carbohydrates.   Exercise goals: For substantial health benefits, adults should do at least 150 minutes (2 hours and 30 minutes) a week of moderate-intensity, or 75 minutes (1 hour and 15 minutes) a week of vigorous-intensity aerobic physical activity, or  an equivalent combination of moderate- and vigorous-intensity aerobic activity. Aerobic activity should be performed in episodes of at least 10 minutes, and preferably, it  should be spread throughout the week.  Behavioral modification strategies: increasing lean protein intake, decreasing simple carbohydrates, increasing vegetables, increasing water intake and emotional eating strategies.  Tammy Olsen has agreed to follow-up with our clinic in 3 weeks. She was informed of the importance of frequent follow-up visits to maximize her success with intensive lifestyle modifications for her multiple health conditions.   Objective:   Blood pressure 139/87, pulse 81, temperature (!) 97.5 F (36.4 C), temperature source Oral, height 5\' 6"  (1.676 m), weight 240 lb (108.9 kg), SpO2 98 %, unknown if currently breastfeeding. Body mass index is 38.74 kg/m.  General: Cooperative, alert, well developed, in no acute distress. HEENT: Conjunctivae and lids unremarkable. Cardiovascular: Regular rhythm.  Lungs: Normal work of breathing. Neurologic: No focal deficits.   Lab Results  Component Value Date   CREATININE 0.70 10/17/2019   BUN 8 10/17/2019   NA 140 10/17/2019   K 4.1 10/17/2019   CL 103 10/17/2019   CO2 23 10/17/2019   Lab Results  Component Value Date   ALT 18 10/17/2019   AST 20 10/17/2019   ALKPHOS 94 10/17/2019   BILITOT 0.3 10/17/2019   Lab Results  Component Value Date   HGBA1C 4.9 10/17/2019   HGBA1C 5.1 06/15/2015   HGBA1C 5.0 04/02/2014   Lab Results  Component Value Date   INSULIN 13.8 10/17/2019   Lab Results  Component Value Date   TSH 1.620 10/17/2019   Lab Results  Component Value Date   CHOL 237 (H) 10/17/2019   HDL 52 10/17/2019   LDLCALC 157 (H) 10/17/2019   TRIG 154 (H) 10/17/2019   CHOLHDL 6.3 (H) 06/15/2015   Lab Results  Component Value Date   WBC 9.2 10/17/2019   HGB 14.7 10/17/2019   HCT 43.7 10/17/2019   MCV 88 10/17/2019   PLT 364 10/17/2019   Lab Results  Component Value Date   IRON 81 10/17/2019   TIBC 357 10/17/2019   FERRITIN 101 10/17/2019   Attestation Statements:   Reviewed by clinician on day of  visit: allergies, medications, problem list, medical history, surgical history, family history, social history, and previous encounter notes.  I, 12/17/2019, CMA, am acting as transcriptionist for Insurance claims handler, DO  I have reviewed the above documentation for accuracy and completeness, and I agree with the above. Helane Rima, DO

## 2020-06-11 ENCOUNTER — Encounter (INDEPENDENT_AMBULATORY_CARE_PROVIDER_SITE_OTHER): Payer: Self-pay | Admitting: Physician Assistant

## 2020-06-11 ENCOUNTER — Other Ambulatory Visit: Payer: Self-pay

## 2020-06-11 ENCOUNTER — Ambulatory Visit (INDEPENDENT_AMBULATORY_CARE_PROVIDER_SITE_OTHER): Payer: 59 | Admitting: Physician Assistant

## 2020-06-11 VITALS — BP 142/99 | HR 86 | Temp 98.1°F | Ht 66.0 in | Wt 240.0 lb

## 2020-06-11 DIAGNOSIS — Z9189 Other specified personal risk factors, not elsewhere classified: Secondary | ICD-10-CM | POA: Diagnosis not present

## 2020-06-11 DIAGNOSIS — Z6838 Body mass index (BMI) 38.0-38.9, adult: Secondary | ICD-10-CM | POA: Diagnosis not present

## 2020-06-11 DIAGNOSIS — F3289 Other specified depressive episodes: Secondary | ICD-10-CM

## 2020-06-11 DIAGNOSIS — I10 Essential (primary) hypertension: Secondary | ICD-10-CM
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
## 2020-06-16 NOTE — Progress Notes (Signed)
Chief Complaint:   OBESITY Tammy Olsen is here to discuss her progress with her obesity treatment plan along with follow-up of her obesity related diagnoses. Tammy Olsen is on practicing portion control and making smarter food choices, such as increasing vegetables and decreasing simple carbohydrates and states she is following her eating plan approximately 80% of the time. Tammy Olsen states she is doing 0 minutes 0 times per week.  Today's visit was #: 11 Starting weight: 259 lbs Starting date: 10/17/2019 Today's weight: 240 lbs Today's date: 06/11/2020 Total lbs lost to date: 19 Total lbs lost since last in-office visit: 0  Interim History: Tammy Olsen had a Baker's cyst removed last week and she has not been able to exercise. She tends to crave carbohydrates and she struggles with snacking at night. She does not want to be on a Category plan or journaling.  Subjective:   1. Essential hypertension Tammy Olsen is on Cozaar, and her blood pressure is slightly elevated today. She denies chest pain or headache.  2. Other depression, with emotional eating Tammy Olsen is on Prozac, and she states that she eats when she is not hungry. She also notes she is eating a lot of carbohydrates but she is unsure how to make herself stop.  3. At risk for heart disease Tammy Olsen is at a higher than average risk for cardiovascular disease due to obesity.   Assessment/Plan:   1. Essential hypertension Tammy Olsen will continue her medications, healthy weight loss, and exercise to improve blood pressure control. We will watch for signs of hypotension as she continues her lifestyle modifications.  2. Other depression, with emotional eating Behavior modification techniques were discussed today to help Tammy Olsen deal with her emotional/non-hunger eating behaviors. We will refer to Dr. [name], our Bariatric Psychologist for evaluation. Orders and follow up as documented in patient record.   3. At risk for heart disease Tammy Olsen was given  approximately 15 minutes of coronary artery disease prevention counseling today. She is 45 y.o. female and has risk factors for heart disease including obesity. We discussed intensive lifestyle modifications today with an emphasis on specific weight loss instructions and strategies.   Repetitive spaced learning was employed today to elicit superior memory formation and behavioral change.  4. Class 2 severe obesity with serious comorbidity and body mass index (BMI) of 38.0 to 38.9 in adult, unspecified obesity type Tammy Olsen) Tammy Olsen is currently in the action stage of change. As such, her goal is to continue with weight loss efforts. She has agreed to practicing portion control and making smarter food choices, such as increasing vegetables and decreasing simple carbohydrates.   Exercise goals: No exercise has been prescribed at this time.  Behavioral modification strategies: increasing lean protein intake and meal planning and cooking strategies.  Tammy Olsen has agreed to follow-up with our clinic in 3 weeks. She was informed of the importance of frequent follow-up visits to maximize her success with intensive lifestyle modifications for her multiple health conditions.   Objective:   Blood pressure (!) 142/99, pulse 86, temperature 98.1 F (36.7 C), height 5\' 6"  (1.676 m), weight 240 lb (108.9 kg), SpO2 97 %, unknown if currently breastfeeding. Body mass index is 38.74 kg/m.  General: Cooperative, alert, well developed, in no acute distress. HEENT: Conjunctivae and lids unremarkable. Cardiovascular: Regular rhythm.  Lungs: Normal work of breathing. Neurologic: No focal deficits.   Lab Results  Component Value Date   CREATININE 0.70 10/17/2019   BUN 8 10/17/2019   NA 140 10/17/2019   K 4.1  10/17/2019   CL 103 10/17/2019   CO2 23 10/17/2019   Lab Results  Component Value Date   ALT 18 10/17/2019   AST 20 10/17/2019   ALKPHOS 94 10/17/2019   BILITOT 0.3 10/17/2019   Lab Results  Component  Value Date   HGBA1C 4.9 10/17/2019   HGBA1C 5.1 06/15/2015   HGBA1C 5.0 04/02/2014   Lab Results  Component Value Date   INSULIN 13.8 10/17/2019   Lab Results  Component Value Date   TSH 1.620 10/17/2019   Lab Results  Component Value Date   CHOL 237 (H) 10/17/2019   HDL 52 10/17/2019   LDLCALC 157 (H) 10/17/2019   TRIG 154 (H) 10/17/2019   CHOLHDL 6.3 (H) 06/15/2015   Lab Results  Component Value Date   WBC 9.2 10/17/2019   HGB 14.7 10/17/2019   HCT 43.7 10/17/2019   MCV 88 10/17/2019   PLT 364 10/17/2019   Lab Results  Component Value Date   IRON 81 10/17/2019   TIBC 357 10/17/2019   FERRITIN 101 10/17/2019   Attestation Statements:   Reviewed by clinician on day of visit: allergies, medications, problem list, medical history, surgical history, family history, social history, and previous encounter notes.   Tammy Olsen, am acting as transcriptionist for [employer], PA-C.  I have reviewed the above documentation for accuracy and completeness, and I agree with the above. Tammy Cliche, PA-C

## 2020-06-18 ENCOUNTER — Encounter (INDEPENDENT_AMBULATORY_CARE_PROVIDER_SITE_OTHER): Payer: Self-pay

## 2020-06-18 ENCOUNTER — Telehealth (INDEPENDENT_AMBULATORY_CARE_PROVIDER_SITE_OTHER): Payer: PRIVATE HEALTH INSURANCE | Admitting: Psychology

## 2020-06-25 ENCOUNTER — Other Ambulatory Visit (INDEPENDENT_AMBULATORY_CARE_PROVIDER_SITE_OTHER): Payer: Self-pay | Admitting: Family Medicine

## 2020-06-25 DIAGNOSIS — K219 Gastro-esophageal reflux disease without esophagitis: Secondary | ICD-10-CM

## 2020-06-25 MED ORDER — PANTOPRAZOLE SODIUM 40 MG PO TBEC: 40.0000 mg | DELAYED_RELEASE_TABLET | Freq: Every day | ORAL | 0 refills | Status: DC

## 2020-06-25 NOTE — Telephone Encounter (Signed)
Pt last seen by Tracey Aguilar, PA-C.  

## 2020-06-25 NOTE — Telephone Encounter (Signed)
Is this okay to refill? 

## 2020-07-06 ENCOUNTER — Ambulatory Visit (INDEPENDENT_AMBULATORY_CARE_PROVIDER_SITE_OTHER): Payer: 59 | Admitting: Family Medicine

## 2020-07-16 ENCOUNTER — Ambulatory Visit (INDEPENDENT_AMBULATORY_CARE_PROVIDER_SITE_OTHER): Payer: 59 | Admitting: Family Medicine

## 2020-07-27 ENCOUNTER — Other Ambulatory Visit (INDEPENDENT_AMBULATORY_CARE_PROVIDER_SITE_OTHER): Payer: Self-pay | Admitting: Physician Assistant

## 2020-07-27 DIAGNOSIS — K219 Gastro-esophageal reflux disease without esophagitis: Secondary | ICD-10-CM

## 2020-07-27 NOTE — Telephone Encounter (Signed)
Refill request

## 2020-07-27 NOTE — Telephone Encounter (Signed)
Pt last seen by Tracey Aguilar, PA-C.  

## 2020-07-30 HISTORY — PX: KNEE ARTHROSCOPY: SHX127

## 2020-08-19 ENCOUNTER — Ambulatory Visit (INDEPENDENT_AMBULATORY_CARE_PROVIDER_SITE_OTHER): Payer: 59 | Admitting: Family Medicine

## 2020-08-19 ENCOUNTER — Encounter (INDEPENDENT_AMBULATORY_CARE_PROVIDER_SITE_OTHER): Payer: Self-pay | Admitting: Family Medicine

## 2020-08-19 ENCOUNTER — Other Ambulatory Visit: Payer: Self-pay

## 2020-08-19 VITALS — BP 138/81 | HR 95 | Temp 98.4°F | Ht 66.0 in | Wt 250.0 lb

## 2020-08-19 DIAGNOSIS — M25562 Pain in left knee: Secondary | ICD-10-CM

## 2020-08-19 DIAGNOSIS — F3289 Other specified depressive episodes: Secondary | ICD-10-CM

## 2020-08-19 DIAGNOSIS — Z9189 Other specified personal risk factors, not elsewhere classified: Secondary | ICD-10-CM

## 2020-08-19 DIAGNOSIS — G8929 Other chronic pain: Secondary | ICD-10-CM

## 2020-08-19 DIAGNOSIS — R632 Polyphagia: Secondary | ICD-10-CM | POA: Diagnosis not present

## 2020-08-19 DIAGNOSIS — Z6841 Body Mass Index (BMI) 40.0 and over, adult: Secondary | ICD-10-CM

## 2020-08-19 NOTE — Progress Notes (Signed)
Chief Complaint:   OBESITY Tammy Olsen is here to discuss her progress with her obesity treatment plan along with follow-up of her obesity related diagnoses. See Medical Weight Management Flowsheet for bioelectrical impedance results.  Today's visit was #: 12 Starting weight: 259 lbs Starting date: 10/17/2019 Today's weight: 250 lbs Today's date: 08/19/2020 Weight change since last visit: +10 lbs Total lbs lost to date: 9 lbs Body mass index is 40.35 kg/m.  Total weight loss percentage to date: -3.47%  Interim History: Tammy Olsen had left knee surgery on 07/29/2020.  She just went back to work this week. Tried Saxenda, but it is not covered by her insurance.  Denies polyphagia.  Endorses carb cravings.  She has increased her water intake.    Nutrition Plan: practicing portion control and making smarter food choices, such as increasing vegetables and decreasing simple carbohydrates for 60% of the time. Activity:  PT for 60 minutes 2 times per week.  Assessment/Plan:   1. Polyphagia Not at goal. Current treatment: None. Polyphagia refers to excessive feelings of hunger. She will continue to focus on protein-rich, low simple carbohydrate foods. We reviewed the importance of hydration, regular exercise for stress reduction, and restorative sleep.  Plan:  Start phentermine 8 mg daily and Topamax 25 mg daily, as per below.  - Start Phentermine HCl 8 MG TABS; Take 8 mg by mouth in the morning.  Dispense: 30 tablet; Refill: 0 - Start topiramate (TOPAMAX) 25 MG tablet; Take 1 tablet (25 mg total) by mouth daily with supper.  Dispense: 30 tablet; Refill: 0  I have consulted the [place] Controlled Substances Registry for this patient, and feel the risk/benefit ratio today is favorable for proceeding with this prescription for a controlled substance. The patient understands monitoring parameters and red flags.   This patient 1) has no evidence of serious cardiovascular disease; 2) does not have serious  psychiatric disease or a history of substance abuse; 3) has been informed about weight loss medications that are FDA-approved for long term use whereas phentermine is not.  Patient understands that all anti-obesity medications are contraindicated in pregnancy. Patient denies a history of glaucoma. Patient understands that long-term use of phentermine is considered off-label use of this medication, however, that the Endocrine Society and recent research supports that long-term use of phentermine does not appear to have detrimental health effects if it is used in an appropriate patient. In addition, a 2019 study published in Obesity Journal on [id number] patients concluded that "recommendations to limit phentermine to less than 3 months do not align with current concepts of pharmacologic treatment of obesity", and that "long term phentermine users experience greater weight loss without apparent increases in cardiovascular risk".  We reviewed potential side effects including insomnia, dry mouth, increased heart rate and blood pressure, and increased anxiety. We reviewed reducing caffeine consumption while taking phentermine, especially if the patient is experiencing side effects. Alternative treatment options were discussed. All questions were answered, and the patient wishes to move forward with this medication.  2. Chronic pain of left knee Tammy Olsen had knee surgery on 07/29/2020.  She is currently in PT for this.  She is taking ibuprofen 400 mg twice daily and tramadol 50 mg as needed at night.  We will continue to follow along as it relates to her weight loss journey.  3. Other depression, with emotional eating Not at goal. Medication: Prozac 40 mg daily. Discussed cues and consequences, how thoughts affect eating, model of thoughts, feelings, and behaviors, and  strategies for change by focusing on the cue. Discussed cognitive distortions, coping thoughts, and how to change your thoughts.  4. At risk for  hypertension Due to Tammy Olsen's current state of health, lifestyle habits and medical condition(s), she is at a higher risk for developing hypertension. t least 8 minutes was spent on counseling Romey about these concerns today.  I stressed the importance of reversing risks factors for hypertension which include lowering her BMI.  I discussed there are genetic and family factors which cannot be changed, and I encouraged Tammy Olsen to focus on lifestyle choices that can be modified such as engaging in a healthy diet which includes lowering salt intake, increasing activity, limiting alcohol and/or tobacco intake.    5. Obesity, current BMI 40.5  Course: Tammy Olsen is currently in the action stage of change. As such, her goal is to continue with weight loss efforts.   Nutrition goals: She has agreed to practicing portion control and making smarter food choices, such as increasing vegetables and decreasing simple carbohydrates.   Exercise goals:  As is.  Behavioral modification strategies: increasing lean protein intake, decreasing simple carbohydrates, increasing vegetables, increasing water intake, decreasing liquid calories, and meal planning and cooking strategies.  Tammy Olsen has agreed to follow-up with our clinic in 3-4 weeks. She was informed of the importance of frequent follow-up visits to maximize her success with intensive lifestyle modifications for her multiple health conditions.   Objective:   Blood pressure 138/81, pulse 95, temperature 98.4 F (36.9 C), temperature source Oral, height 5\' 6"  (1.676 m), weight 250 lb (113.4 kg), SpO2 96 %, unknown if currently breastfeeding. Body mass index is 40.35 kg/m.  General: Cooperative, alert, well developed, in no acute distress. HEENT: Conjunctivae and lids unremarkable. Cardiovascular: Regular rhythm.  Lungs: Normal work of breathing. Neurologic: No focal deficits.   Lab Results  Component Value Date   CREATININE 0.70 10/17/2019   BUN 8  10/17/2019   Tammy 140 10/17/2019   K 4.1 10/17/2019   CL 103 10/17/2019   CO2 23 10/17/2019   Lab Results  Component Value Date   ALT 18 10/17/2019   AST 20 10/17/2019   ALKPHOS 94 10/17/2019   BILITOT 0.3 10/17/2019   Lab Results  Component Value Date   HGBA1C 4.9 10/17/2019   HGBA1C 5.1 06/15/2015   HGBA1C 5.0 04/02/2014   Lab Results  Component Value Date   INSULIN 13.8 10/17/2019   Lab Results  Component Value Date   TSH 1.620 10/17/2019   Lab Results  Component Value Date   CHOL 237 (H) 10/17/2019   HDL 52 10/17/2019   LDLCALC 157 (H) 10/17/2019   TRIG 154 (H) 10/17/2019   CHOLHDL 6.3 (H) 06/15/2015   Lab Results  Component Value Date   WBC 9.2 10/17/2019   HGB 14.7 10/17/2019   HCT 43.7 10/17/2019   MCV 88 10/17/2019   PLT 364 10/17/2019   Lab Results  Component Value Date   IRON 81 10/17/2019   TIBC 357 10/17/2019   FERRITIN 101 10/17/2019   Attestation Statements:   Reviewed by clinician on day of visit: allergies, medications, problem list, medical history, surgical history, family history, social history, and previous encounter notes.  I, 12/17/2019, CMA, am acting as transcriptionist for Insurance claims handler, DO  I have reviewed the above documentation for accuracy and completeness, and I agree with the above. Helane Rima, DO

## 2020-08-20 ENCOUNTER — Encounter (INDEPENDENT_AMBULATORY_CARE_PROVIDER_SITE_OTHER): Payer: Self-pay | Admitting: Family Medicine

## 2020-08-24 MED ORDER — TOPIRAMATE 25 MG PO TABS: 25.0000 mg | ORAL_TABLET | Freq: Every day | ORAL | 0 refills | Status: DC

## 2020-08-24 MED ORDER — PHENTERMINE HCL 8 MG PO TABS: 8.0000 mg | ORAL_TABLET | Freq: Every morning | ORAL | 0 refills | Status: DC

## 2020-08-28 ENCOUNTER — Other Ambulatory Visit (INDEPENDENT_AMBULATORY_CARE_PROVIDER_SITE_OTHER): Payer: Self-pay | Admitting: Physician Assistant

## 2020-08-28 ENCOUNTER — Other Ambulatory Visit (INDEPENDENT_AMBULATORY_CARE_PROVIDER_SITE_OTHER): Payer: Self-pay | Admitting: Family Medicine

## 2020-08-28 DIAGNOSIS — K219 Gastro-esophageal reflux disease without esophagitis: Secondary | ICD-10-CM

## 2020-08-28 DIAGNOSIS — F411 Generalized anxiety disorder: Secondary | ICD-10-CM

## 2020-08-31 ENCOUNTER — Encounter (INDEPENDENT_AMBULATORY_CARE_PROVIDER_SITE_OTHER): Payer: Self-pay | Admitting: Family Medicine

## 2020-08-31 DIAGNOSIS — K219 Gastro-esophageal reflux disease without esophagitis: Secondary | ICD-10-CM

## 2020-08-31 DIAGNOSIS — F411 Generalized anxiety disorder: Secondary | ICD-10-CM

## 2020-08-31 MED ORDER — PANTOPRAZOLE SODIUM 40 MG PO TBEC: 40.0000 mg | DELAYED_RELEASE_TABLET | Freq: Every day | ORAL | 0 refills | Status: DC

## 2020-08-31 MED ORDER — FLUOXETINE HCL 40 MG PO CAPS: 40.0000 mg | ORAL_CAPSULE | Freq: Every day | ORAL | 0 refills | Status: DC

## 2020-08-31 NOTE — Telephone Encounter (Signed)
DR Wallace 

## 2020-08-31 NOTE — Telephone Encounter (Signed)
Dr.Wallace °

## 2020-09-08 ENCOUNTER — Other Ambulatory Visit: Payer: Self-pay

## 2020-09-08 ENCOUNTER — Encounter (INDEPENDENT_AMBULATORY_CARE_PROVIDER_SITE_OTHER): Payer: Self-pay | Admitting: Family Medicine

## 2020-09-08 ENCOUNTER — Ambulatory Visit (INDEPENDENT_AMBULATORY_CARE_PROVIDER_SITE_OTHER): Payer: 59 | Admitting: Family Medicine

## 2020-09-08 VITALS — BP 139/86 | HR 93 | Temp 98.2°F | Ht 66.0 in | Wt 253.0 lb

## 2020-09-08 DIAGNOSIS — G8929 Other chronic pain: Secondary | ICD-10-CM

## 2020-09-08 DIAGNOSIS — E782 Mixed hyperlipidemia: Secondary | ICD-10-CM | POA: Diagnosis not present

## 2020-09-08 DIAGNOSIS — Z6841 Body Mass Index (BMI) 40.0 and over, adult: Secondary | ICD-10-CM

## 2020-09-08 DIAGNOSIS — F419 Anxiety disorder, unspecified: Secondary | ICD-10-CM | POA: Diagnosis not present

## 2020-09-08 DIAGNOSIS — Z9189 Other specified personal risk factors, not elsewhere classified: Secondary | ICD-10-CM

## 2020-09-08 DIAGNOSIS — M25562 Pain in left knee: Secondary | ICD-10-CM | POA: Diagnosis not present

## 2020-09-16 NOTE — Progress Notes (Signed)
Chief Complaint:   OBESITY Tammy Olsen is here to discuss her progress with her obesity treatment plan along with follow-up of her obesity related diagnoses.   Today's visit was #: 13 Starting weight: 259 lbs Starting date: 10/17/2019 Today's weight: 253 lbs Today's date: 09/08/2020 Weight change since last visit: +3 lbs Total lbs lost to date: 6 lbs Body mass index is 40.84 kg/m.  Total weight loss percentage to date: -2.32%  Interim History:  Tammy Olsen recently had COVID.  She says her left knee is still fairly painful.  She says it "pops" loudly each time she extends it.  Current Meal Plan: practicing portion control and making smarter food choices, such as increasing vegetables and decreasing simple carbohydrates for 90% of the time.  Current Exercise Plan: None. Current Anti-Obesity Medications: phentermine 8 mg daily. Side effects: None.  Assessment/Plan:   1. Chronic pain of left knee Delania had knee surgery on 07/29/2020.  She is currently in PT for this.  She is taking ibuprofen 400 mg twice daily and tramadol 50 mg as needed at night.  We will continue to follow along as it relates to her weight loss journey.  2. Mixed hyperlipidemia Course: Not at goal. Lipid-lowering medications: None.   Plan: Dietary changes: Increase soluble fiber, decrease simple carbohydrates, decrease saturated fat. Exercise changes: Moderate to vigorous-intensity aerobic activity 150 minutes per week or as tolerated. We will continue to monitor along with PCP/specialists as it pertains to her weight loss journey.  Lab Results  Component Value Date   CHOL 237 (H) 10/17/2019   HDL 52 10/17/2019   LDLCALC 157 (H) 10/17/2019   TRIG 154 (H) 10/17/2019   CHOLHDL 6.3 (H) 06/15/2015   Lab Results  Component Value Date   ALT 18 10/17/2019   AST 20 10/17/2019   ALKPHOS 94 10/17/2019   BILITOT 0.3 10/17/2019   The 10-year ASCVD risk score Denman George DC Jr., et al., 2013) is: 1.7%   Values used to calculate  the score:     Age: 45 years     Sex: Female     Is Non-Hispanic African American: No     Diabetic: No     Tobacco smoker: No     Systolic Blood Pressure: 139 mmHg     Is BP treated: Yes     HDL Cholesterol: 52 mg/dL     Total Cholesterol: 237 mg/dL  3. Anxiety, with emotional eating Tammy Olsen is taking Prozac 40 mg daily for anxiety. Behavior modification techniques were discussed today to help deal with emotional/non-hunger eating behaviors.  4. At risk for activity intolerance Tammy Olsen was given approximately 8 minutes of counseling today regarding her increased risk for exercise intolerance.  We discussed patient's specific personal and medical issues that raise our concern.  She was advised of strategies to prevent injury and ways to improve her cardiopulmonary fitness levels slowly over time.  We additionally discussed various fitness trackers and smart phone apps to help motivate the patient to stay on track.    5. Obesity, current BMI 40.8  Course: Tammy Olsen is currently in the action stage of change. As such, her goal is to continue with weight loss efforts.   Nutrition goals: She has agreed to practicing portion control and making smarter food choices, such as increasing vegetables and decreasing simple carbohydrates.   Exercise goals:  PT.  Behavioral modification strategies: increasing lean protein intake, decreasing simple carbohydrates, increasing vegetables, and increasing water intake.  Trannie has agreed to follow-up  with our clinic in 4 weeks. She was informed of the importance of frequent follow-up visits to maximize her success with intensive lifestyle modifications for her multiple health conditions.   Objective:   Blood pressure 139/86, pulse 93, temperature 98.2 F (36.8 C), temperature source Oral, height 5\' 6"  (1.676 m), weight 253 lb (114.8 kg), SpO2 95 %, unknown if currently breastfeeding. Body mass index is 40.84 kg/m.  General: Cooperative, alert, well developed,  in no acute distress. HEENT: Conjunctivae and lids unremarkable. Cardiovascular: Regular rhythm.  Lungs: Normal work of breathing. Neurologic: No focal deficits.   Lab Results  Component Value Date   CREATININE 0.70 10/17/2019   BUN 8 10/17/2019   NA 140 10/17/2019   K 4.1 10/17/2019   CL 103 10/17/2019   CO2 23 10/17/2019   Lab Results  Component Value Date   ALT 18 10/17/2019   AST 20 10/17/2019   ALKPHOS 94 10/17/2019   BILITOT 0.3 10/17/2019   Lab Results  Component Value Date   HGBA1C 4.9 10/17/2019   HGBA1C 5.1 06/15/2015   HGBA1C 5.0 04/02/2014   Lab Results  Component Value Date   INSULIN 13.8 10/17/2019   Lab Results  Component Value Date   TSH 1.620 10/17/2019   Lab Results  Component Value Date   CHOL 237 (H) 10/17/2019   HDL 52 10/17/2019   LDLCALC 157 (H) 10/17/2019   TRIG 154 (H) 10/17/2019   CHOLHDL 6.3 (H) 06/15/2015   Lab Results  Component Value Date   VD25OH 51.8 10/17/2019   VD25OH 21 (L) 06/15/2015   Lab Results  Component Value Date   WBC 9.2 10/17/2019   HGB 14.7 10/17/2019   HCT 43.7 10/17/2019   MCV 88 10/17/2019   PLT 364 10/17/2019   Lab Results  Component Value Date   IRON 81 10/17/2019   TIBC 357 10/17/2019   FERRITIN 101 10/17/2019   Attestation Statements:   Reviewed by clinician on day of visit: allergies, medications, problem list, medical history, surgical history, family history, social history, and previous encounter notes.  I, 12/17/2019, CMA, am acting as transcriptionist for Insurance claims handler, DO  I have reviewed the above documentation for accuracy and completeness, and I agree with the above. Helane Rima, DO

## 2020-09-29 ENCOUNTER — Other Ambulatory Visit: Payer: Self-pay

## 2020-09-29 ENCOUNTER — Encounter (INDEPENDENT_AMBULATORY_CARE_PROVIDER_SITE_OTHER): Payer: Self-pay | Admitting: Family Medicine

## 2020-09-29 ENCOUNTER — Ambulatory Visit (INDEPENDENT_AMBULATORY_CARE_PROVIDER_SITE_OTHER): Payer: 59 | Admitting: Family Medicine

## 2020-09-29 VITALS — BP 139/83 | HR 91 | Temp 98.2°F | Ht 66.0 in | Wt 250.0 lb

## 2020-09-29 DIAGNOSIS — I10 Essential (primary) hypertension: Secondary | ICD-10-CM

## 2020-09-29 DIAGNOSIS — Z9189 Other specified personal risk factors, not elsewhere classified: Secondary | ICD-10-CM

## 2020-09-29 DIAGNOSIS — K219 Gastro-esophageal reflux disease without esophagitis: Secondary | ICD-10-CM

## 2020-09-29 DIAGNOSIS — F411 Generalized anxiety disorder: Secondary | ICD-10-CM | POA: Insufficient documentation

## 2020-09-29 DIAGNOSIS — R102 Pelvic and perineal pain: Secondary | ICD-10-CM | POA: Diagnosis not present

## 2020-09-29 DIAGNOSIS — M25562 Pain in left knee: Secondary | ICD-10-CM

## 2020-09-29 DIAGNOSIS — Z6841 Body Mass Index (BMI) 40.0 and over, adult: Secondary | ICD-10-CM

## 2020-09-29 DIAGNOSIS — R632 Polyphagia: Secondary | ICD-10-CM | POA: Insufficient documentation

## 2020-09-29 MED ORDER — PANTOPRAZOLE SODIUM 40 MG PO TBEC: 40.0000 mg | DELAYED_RELEASE_TABLET | Freq: Every day | ORAL | 0 refills | Status: DC

## 2020-09-29 MED ORDER — PHENTERMINE HCL 8 MG PO TABS: 8.0000 mg | ORAL_TABLET | Freq: Every morning | ORAL | 0 refills | Status: DC

## 2020-09-29 MED ORDER — TOPIRAMATE 25 MG PO TABS: 25.0000 mg | ORAL_TABLET | Freq: Every day | ORAL | 0 refills | Status: DC

## 2020-09-29 MED ORDER — FLUOXETINE HCL 40 MG PO CAPS: 40.0000 mg | ORAL_CAPSULE | Freq: Every day | ORAL | 0 refills | Status: DC

## 2020-09-29 MED ORDER — LOSARTAN POTASSIUM 50 MG PO TABS: 50.0000 mg | ORAL_TABLET | Freq: Every day | ORAL | 3 refills | Status: DC

## 2020-10-01 ENCOUNTER — Encounter (INDEPENDENT_AMBULATORY_CARE_PROVIDER_SITE_OTHER): Payer: Self-pay

## 2020-10-09 NOTE — Progress Notes (Signed)
Chief Complaint:   OBESITY [name] is here to discuss her progress with her obesity treatment plan along with follow-up of her obesity related diagnoses.   Today's visit was #: 14 Starting weight: 259 lbs Starting date: 10/17/2019 Today's weight: 250 lbs Today's date: 09/29/2020 Weight change since last visit: 3 lbs Total lbs lost to date: 9 lbs Body mass index is 40.35 kg/m.  Total weight loss percentage to date: -3.47%  Interim History:  Tammy Olsen is not satisfied with the care given regarding her knee pain.  She would like a second opinion. Current Meal Plan: practicing portion control and making smarter food choices, such as increasing vegetables and decreasing simple carbohydrates for 90% of the time.  Current Exercise Plan: PT for 30 minutes 6 times per week. Current Anti-Obesity Medications: phentermine 8 mg daily. Side effects: None.  Assessment/Plan:   Orders Placed This Encounter  Procedures   Ambulatory referral to Sports Medicine   Ambulatory referral to Gynecology   Meds ordered this encounter  Medications   FLUoxetine (PROZAC) 40 MG capsule    Sig: Take 1 capsule (40 mg total) by mouth daily.    Dispense:  90 capsule    Refill:  0   losartan (COZAAR) 50 MG tablet    Sig: Take 1 tablet (50 mg total) by mouth daily.    Dispense:  90 tablet    Refill:  3   pantoprazole (PROTONIX) 40 MG tablet    Sig: Take 1 tablet (40 mg total) by mouth daily.    Dispense:  90 tablet    Refill:  0   Phentermine HCl 8 MG TABS    Sig: Take 8 mg by mouth in the morning.    Dispense:  30 tablet    Refill:  0   topiramate (TOPAMAX) 25 MG tablet    Sig: Take 1 tablet (25 mg total) by mouth daily with supper.    Dispense:  30 tablet    Refill:  0    1. Left knee pain, chronic Tammy Olsen had knee surgery on 07/29/2020.  She is currently in PT for this.  She is taking ibuprofen 400 mg twice daily and tramadol 50 mg as needed at night.  She would like a referral for a second opinion for  her knee pain.  She will bring records from [place], [facility]. Plan:  Referral to Sports Medicine placed today.  - Ambulatory referral to Sports Medicine  2. Pelvic pain Worsening. Will place referral to Gynecology today. We will continue to monitor symptoms as they relate to her weight loss journey.  - Ambulatory referral to Gynecology  3. Essential hypertension At goal. Medications: Cozaar 50 mg daily.   Plan: Avoid buying foods that are: processed, frozen, or prepackaged to avoid excess salt. We will watch for signs of hypotension as she continues lifestyle modifications.  BP Readings from Last 3 Encounters:  09/29/20 139/83  09/08/20 139/86  08/19/20 138/81   Lab Results  Component Value Date   CREATININE 0.70 10/17/2019   - Refill losartan (COZAAR) 50 MG tablet; Take 1 tablet (50 mg total) by mouth daily.  Dispense: 90 tablet; Refill: 3  4. Gastroesophageal reflux disease without esophagitis Tammy Olsen is taking Protonix 40 mg daily for GERD symptoms. Plan:  The current medical regimen is effective;  continue present plan and medications.  - Refill pantoprazole (PROTONIX) 40 MG tablet; Take 1 tablet (40 mg total) by mouth daily.  Dispense: 90 tablet; Refill: 0  5.  Polyphagia Controlled. Current treatment: phentermine 8 mg daily, Topamax 25 mg daily. She will continue to focus on protein-rich, low simple carbohydrate foods. We reviewed the importance of hydration, regular exercise for stress reduction, and restorative sleep.  Plan:  Refill phentermine and Topamax today, as per below.  Having again reminded the patient of the "off label" use of Phentermine beyond three consecutive months, and again discussing the risks, benefits, contraindications, and limitations of it's use; given it's role in the successful treatment of obesity thus far and lack of adverse effect, patient has expressed desire and given informed verbal consent to continue use.   I have consulted the [place]  Controlled Substances Registry for this patient, and feel the risk/benefit ratio today is favorable for proceeding with this prescription for a controlled substance. The patient understands monitoring parameters and red flags.   - Refill Phentermine HCl 8 MG TABS; Take 8 mg by mouth in the morning.  Dispense: 30 tablet; Refill: 0 - Refill topiramate (TOPAMAX) 25 MG tablet; Take 1 tablet (25 mg total) by mouth daily with supper.  Dispense: 30 tablet; Refill: 0  6. GAD (generalized anxiety disorder) Tammy Olsen is taking Prozac 40 mg daily for anxiety. Plan:  The current medical regimen is effective;  continue present plan and medications.  - Refill FLUoxetine (PROZAC) 40 MG capsule; Take 1 capsule (40 mg total) by mouth daily.  Dispense: 90 capsule; Refill: 0  7. At risk for activity intolerance Tammy Olsen was given approximately 8 minutes of counseling today regarding her increased risk for exercise intolerance.  We discussed patient's specific personal and medical issues that raise our concern.  She was advised of strategies to prevent injury and ways to improve her cardiopulmonary fitness levels slowly over time.    8. Obesity, current BMI 40.5  Course: Tammy Olsen is currently in the action stage of change. As such, her goal is to continue with weight loss efforts.   Nutrition goals: She has agreed to practicing portion control and making smarter food choices, such as increasing vegetables and decreasing simple carbohydrates.   Exercise goals:  As is.  Behavioral modification strategies: increasing lean protein intake, decreasing simple carbohydrates, increasing vegetables, and increasing water intake.  Tammy Olsen has agreed to follow-up with our clinic in 4 weeks. She was informed of the importance of frequent follow-up visits to maximize her success with intensive lifestyle modifications for her multiple health conditions.   Objective:   Blood pressure 139/83, pulse 91, temperature 98.2 F (36.8 C),  temperature source Oral, height 5\' 6"  (1.676 m), weight 250 lb (113.4 kg), SpO2 98 %, unknown if currently breastfeeding. Body mass index is 40.35 kg/m.  General: Cooperative, alert, well developed, in no acute distress. HEENT: Conjunctivae and lids unremarkable. Cardiovascular: Regular rhythm.  Lungs: Normal work of breathing. Neurologic: No focal deficits.   Lab Results  Component Value Date   CREATININE 0.70 10/17/2019   BUN 8 10/17/2019   NA 140 10/17/2019   K 4.1 10/17/2019   CL 103 10/17/2019   CO2 23 10/17/2019   Lab Results  Component Value Date   ALT 18 10/17/2019   AST 20 10/17/2019   ALKPHOS 94 10/17/2019   BILITOT 0.3 10/17/2019   Lab Results  Component Value Date   HGBA1C 4.9 10/17/2019   HGBA1C 5.1 06/15/2015   HGBA1C 5.0 04/02/2014   Lab Results  Component Value Date   INSULIN 13.8 10/17/2019   Lab Results  Component Value Date   TSH 1.620 10/17/2019   Lab  Results  Component Value Date   CHOL 237 (H) 10/17/2019   HDL 52 10/17/2019   LDLCALC 157 (H) 10/17/2019   TRIG 154 (H) 10/17/2019   CHOLHDL 6.3 (H) 06/15/2015   Lab Results  Component Value Date   VD25OH 51.8 10/17/2019   VD25OH 21 (L) 06/15/2015   Lab Results  Component Value Date   WBC 9.2 10/17/2019   HGB 14.7 10/17/2019   HCT 43.7 10/17/2019   MCV 88 10/17/2019   PLT 364 10/17/2019   Lab Results  Component Value Date   IRON 81 10/17/2019   TIBC 357 10/17/2019   FERRITIN 101 10/17/2019   Attestation Statements:   Reviewed by clinician on day of visit: allergies, medications, problem list, medical history, surgical history, family history, social history, and previous encounter notes.  I, Insurance claims handler, CMA, am acting as transcriptionist for Helane Rima, DO  I have reviewed the above documentation for accuracy and completeness, and I agree with the above. Helane Rima, DO

## 2020-10-14 NOTE — Progress Notes (Signed)
Subjective:    I'm seeing this patient as a consultation for:  Dr. Earlene Plater. Note will be routed back to referring provider/PCP.  CC: L knee pain  I, Molly Weber, LAT, ATC, am serving as scribe for Dr. Clementeen Graham.  HPI: Pt is a 46 y/o female presenting w/ chronic L knee pain ongoing for years, but worsening since March w/ no known MOI.  She locates her pain to the anterior and posterior aspect of L knee.  She has previously been seen by Dr. Thomasena Edis at [facility] in April of 2022. Pt has a hx of OA bilat. Pt had surgery in May 2022 on her L knee and put a bone composite graft on her proximal tibia, and menisectomy. Pt is upset that she has only been able to have f/u visits with the surgeon's PA. Pt notes she is scheduled to start the gel injections at the end of Aug at [facility].  L knee swelling: no L knee mechanical symptoms: yes Aggravating factors: transitioning from sitting to stand, standing, walking Treatments tried: surgery, prednisone, aspiration and steroid injections  Past medical history, Surgical history, Family history, Social history, Allergies, and medications have been entered into the medical record, reviewed.   Review of Systems: No new headache, visual changes, nausea, vomiting, diarrhea, constipation, dizziness, abdominal pain, skin rash, fevers, chills, night sweats, weight loss, swollen lymph nodes, body aches, joint swelling, muscle aches, chest pain, shortness of breath, mood changes, visual or auditory hallucinations.   Objective:    Vitals:   10/15/20 0752  BP: 128/84  Pulse: 89  SpO2: 99%   General: Well Developed, well nourished, and in no acute distress.  Neuro/Psych: Alert and oriented x3, extra-ocular muscles intact, able to move all 4 extremities, sensation grossly intact. Skin: Warm and dry, no rashes noted.  Respiratory: Not using accessory muscles, speaking in full sentences, trachea midline.  Cardiovascular: Pulses palpable, no extremity  edema. Abdomen: Does not appear distended. MSK: Left knee mature portal scars present.  Otherwise normal-appearing Normal motion. Tender palpation medial joint line. Slight laxity MCL stress test. Positive medial McMurray's test. Intact strength.   Lab and Radiology Results  Diagnostic Limited MSK Ultrasound of: Left knee Quad tendon intact normal-appearing No significant joint effusion superior patellar space. Patellar tendon normal-appearing  medial joint line narrowed degenerative with degenerative appearing medial meniscus. Lateral joint line slightly narrowed normal-appearing otherwise. Posterior knee moderate to large Baker's cyst. Impression: Medial DJD.  Moderate to large Baker's cyst.   Impression and Recommendations:    Assessment and Plan: 45 y.o. female with persistent left knee pain.  Patient had what sounds like a medial meniscus debridement as well as a bone graft at the medial tibia in late May of this year.  This was over 3 months ago.  She has had a trial of physical therapy with little benefit.  She has persistent pain.  She is in the process of being set up for hyaluronic acid injections.  At this point she is frustrated with her care at [facility] and wishes for a second opinion.  Plan to obtain records from [facility].  She has had good imaging showing DJD.  Plan to proceed with hyaluronic acid injections we will work on authorization now.  If this does not work would consider medial off loader knee brace.  This should help with some of the instability and pain that she has especially the medial compartment.  I do not think the Baker's cyst is contributing  to her pain very much although that certainly could be addressed as well.  Additionally add NSAIDs/Voltaren gel.  Continue quad strengthening exercises taught in PT.  PDMP not reviewed this encounter. Orders Placed This Encounter  Procedures   [place] LIMITED JOINT SPACE STRUCTURES LOW LEFT(NO LINKED  CHARGES)    Standing Status:   Future    Number of Occurrences:   1    Standing Expiration Date:   04/17/2021    Order Specific Question:   Reason for Exam (SYMPTOM  OR DIAGNOSIS REQUIRED)    Answer:   left knee pain    Order Specific Question:   Preferred imaging location?    Answer:   Adult nurse [facility] ordered this encounter  Medications   Diclofenac Sodium (PENNSAID) 2 % SOLN    Sig: Apply 1 Pump topically 2 (two) times daily.    Dispense:  112 g    Refill:  1     Discussed warning signs or symptoms. Please see discharge instructions. Patient expresses understanding.   The above documentation has been reviewed and is accurate and complete Clementeen Graham, M.D.

## 2020-10-15 ENCOUNTER — Ambulatory Visit: Payer: Self-pay

## 2020-10-15 ENCOUNTER — Ambulatory Visit (INDEPENDENT_AMBULATORY_CARE_PROVIDER_SITE_OTHER): Payer: 59 | Admitting: Family Medicine

## 2020-10-15 ENCOUNTER — Other Ambulatory Visit: Payer: Self-pay

## 2020-10-15 VITALS — BP 128/84 | HR 89 | Ht 66.0 in | Wt 253.8 lb

## 2020-10-15 DIAGNOSIS — M1712 Unilateral primary osteoarthritis, left knee: Secondary | ICD-10-CM

## 2020-10-15 DIAGNOSIS — M25562 Pain in left knee: Secondary | ICD-10-CM | POA: Diagnosis not present

## 2020-10-15 MED ORDER — DICLOFENAC SODIUM 2 % EX SOLN: 1.0000 | Freq: Two times a day (BID) | CUTANEOUS | 1 refills | Status: DC

## 2020-10-15 NOTE — Patient Instructions (Addendum)
Thank you for coming in today.   Plan for gel shots.   If not better next step may be the knee brace we talked about.   The baker cyst could be causing some pain but I do not think it is the main source of pain.   I will get records from [facility].   Pennsaid instructions: You have been given a sample/prescription for Pennsaid, a topical medication.    You are to apply this gel to your injured body part twice daily (morning and evening).  A little goes a long way so you can use about a pea-sized amount for each area.  Spread this small amount over the area into a thin film and let it dry.  Be sure that you do not rub the gel into your skin for more than 10 or 15 seconds otherwise it can irritate you skin.   Once you apply the gel, please do not put any other lotion or clothing in contact with that area for 30 minutes to allow the gel to absorb into your skin.  Some people are sensitive to the medication and can develop a sunburn-like rash.  If you have only mild symptoms it is okay to continue to use the medication but if you have any breakdown of your skin you should discontinue its use and please let us know.  If you have been written a prescription for Pennsaid, you will receive a pump bottle of this topical gel through a mail order pharmacy.  The instructions on the bottle will say to apply two pumps twice a day which may be too much gel for your particular area so use the pea-sized amount as your guide.

## 2020-10-20 ENCOUNTER — Telehealth: Payer: Self-pay | Admitting: Family Medicine

## 2020-10-20 NOTE — Telephone Encounter (Signed)
Gelsyn approved for left knee. (Will be covered at 100%)  Left message for patient to call back to schedule.

## 2020-10-26 ENCOUNTER — Ambulatory Visit (INDEPENDENT_AMBULATORY_CARE_PROVIDER_SITE_OTHER): Payer: 59 | Admitting: Family Medicine

## 2020-10-26 ENCOUNTER — Encounter (INDEPENDENT_AMBULATORY_CARE_PROVIDER_SITE_OTHER): Payer: Self-pay | Admitting: Family Medicine

## 2020-10-26 ENCOUNTER — Other Ambulatory Visit: Payer: Self-pay

## 2020-10-26 VITALS — BP 139/81 | HR 83 | Temp 98.1°F | Ht 66.0 in | Wt 248.0 lb

## 2020-10-26 DIAGNOSIS — G8929 Other chronic pain: Secondary | ICD-10-CM

## 2020-10-26 DIAGNOSIS — F411 Generalized anxiety disorder: Secondary | ICD-10-CM

## 2020-10-26 DIAGNOSIS — M25562 Pain in left knee: Secondary | ICD-10-CM

## 2020-10-26 DIAGNOSIS — R1011 Right upper quadrant pain: Secondary | ICD-10-CM

## 2020-10-26 DIAGNOSIS — Z9189 Other specified personal risk factors, not elsewhere classified: Secondary | ICD-10-CM

## 2020-10-26 DIAGNOSIS — Z6841 Body Mass Index (BMI) 40.0 and over, adult: Secondary | ICD-10-CM

## 2020-10-26 DIAGNOSIS — R632 Polyphagia: Secondary | ICD-10-CM

## 2020-10-26 MED ORDER — PROPRANOLOL HCL 20 MG PO TABS: 20.0000 mg | ORAL_TABLET | Freq: Three times a day (TID) | ORAL | 0 refills | Status: DC | PRN

## 2020-10-28 NOTE — Progress Notes (Signed)
Chief Complaint:   OBESITY Tammy Olsen is here to discuss her progress with her obesity treatment plan along with follow-up of her obesity related diagnoses.   Today's visit was #: 15 Starting weight: 259 lbs Starting date: 10/17/2019 Today's weight: 248 lbs Today's date: 10/27/2019 Weight change since last visit: -2 lbs Total lbs lost to date: 11 lbs Body mass index is 40.03 kg/m.  Total weight loss percentage to date: -4.25%  Current Meal Plan: practicing portion control and making smarter food choices, such as increasing vegetables and decreasing simple carbohydrates for 90% of the time.  Current Exercise Plan: Physical therapy/walking for 30 minutes 2-7 times per week. Current Anti-Obesity Medications: Phentermine 8mg . Side effects: Belching, N/V, abdominal pain.  Interim History: Tammy Olsen reports that she stopped taking the Phentermine and Topamax due to them causing stomach issues (belching, N/V, abdominal pain). She does endorse GERD symptoms.  Assessment/Plan:   1. Chronic pain of left knee Tammy Olsen had knee surgery on 07/29/2020.  She is currently in PT for this.  She is taking ibuprofen 400 mg twice daily and tramadol 50 mg as needed at night. She reports a popping sensation. Reviewed evaluation by Sports Medicine recently for second opinion.   2. Polyphagia Current treatment: Phentermine 7 mg daily, Topamax 25 mg daily.   Plan: Stop taking the Phentermine and Topamax due to them causing stomach issues. She will continue to focus on protein-rich, low simple carbohydrate foods. We reviewed the importance of hydration, regular exercise for stress reduction, and restorative sleep.  3. Right upper quadrant pain Worsening. Tammy Olsen complains of abdominal pain. She has a history of know Biliary Dyskinesia.   Plan: Referral placed to [facility] ([facility] preferred by patient).  - Ambulatory referral to General Surgery  4. GAD (generalized anxiety disorder) Improving.  Tammy Olsen is taking Prozac 40 mg daily for anxiety.   Plan: Start taking Propranolol, as per below.  - propranolol (INDERAL) 20 MG tablet; Take 1 tablet (20 mg total) by mouth every 8 (eight) hours as needed (anxiety).  Dispense: 24 tablet; Refill: 0  5. At risk for impaired metabolic function Due to Tammy Olsen's current state of health and medical condition(s), she is at a significantly higher risk for impaired metabolic function.   At least 15 minutes was spent on counseling Tammy Olsen about these concerns today.  This places the patient at a much greater risk to subsequently develop cardio-pulmonary conditions that can negatively affect the patient's quality of life.  I stressed the importance of reversing these risks factors.    6. Obesity, current BMI 40.1 Course: Tammy Olsen is currently in the action stage of change. As such, her goal is to continue with weight loss efforts.   Nutrition goals: She has agreed to practicing portion control and making smarter food choices, such as increasing vegetables and decreasing simple carbohydrates.   Exercise goals: As is.  Behavioral modification strategies: increasing lean protein intake, decreasing simple carbohydrates, and increasing vegetables.  Tammy Olsen has agreed to follow-up with our clinic in 4 weeks. She was informed of the importance of frequent follow-up visits to maximize her success with intensive lifestyle modifications for her multiple health conditions.   Objective:   Blood pressure 139/81, pulse 83, temperature 98.1 F (36.7 C), temperature source Oral, height 5\' 6"  (1.676 m), weight 248 lb (112.5 kg), SpO2 98 %, unknown if currently breastfeeding. Body mass index is 40.03 kg/m.  General: Cooperative, alert, well developed, in no acute distress. HEENT: Conjunctivae and lids unremarkable.  Cardiovascular: Regular rhythm.  Lungs: Normal work of breathing. Neurologic: No focal deficits.   Lab Results  Component Value Date   CREATININE 0.70  10/17/2019   BUN 8 10/17/2019   NA 140 10/17/2019   K 4.1 10/17/2019   CL 103 10/17/2019   CO2 23 10/17/2019   Lab Results  Component Value Date   ALT 18 10/17/2019   AST 20 10/17/2019   ALKPHOS 94 10/17/2019   BILITOT 0.3 10/17/2019   Lab Results  Component Value Date   HGBA1C 4.9 10/17/2019   HGBA1C 5.1 06/15/2015   HGBA1C 5.0 04/02/2014   Lab Results  Component Value Date   INSULIN 13.8 10/17/2019   Lab Results  Component Value Date   TSH 1.620 10/17/2019   Lab Results  Component Value Date   CHOL 237 (H) 10/17/2019   HDL 52 10/17/2019   LDLCALC 157 (H) 10/17/2019   TRIG 154 (H) 10/17/2019   CHOLHDL 6.3 (H) 06/15/2015   Lab Results  Component Value Date   VD25OH 51.8 10/17/2019   VD25OH 21 (L) 06/15/2015   Lab Results  Component Value Date   WBC 9.2 10/17/2019   HGB 14.7 10/17/2019   HCT 43.7 10/17/2019   MCV 88 10/17/2019   PLT 364 10/17/2019   Lab Results  Component Value Date   IRON 81 10/17/2019   TIBC 357 10/17/2019   FERRITIN 101 10/17/2019   Attestation Statements:   Reviewed by clinician on day of visit: allergies, medications, problem list, medical history, surgical history, family history, social history, and previous encounter notes.  Carlos Levering Friedenbach, CMA, am acting as Energy manager for [employer], DO.  I have reviewed the above documentation for accuracy and completeness, and I agree with the above. Helane Rima, DO

## 2020-11-02 ENCOUNTER — Encounter: Payer: Self-pay | Admitting: Obstetrics & Gynecology

## 2020-11-02 ENCOUNTER — Other Ambulatory Visit: Payer: Self-pay

## 2020-11-02 ENCOUNTER — Ambulatory Visit (INDEPENDENT_AMBULATORY_CARE_PROVIDER_SITE_OTHER): Payer: 59 | Admitting: Obstetrics & Gynecology

## 2020-11-02 VITALS — BP 132/88 | HR 78 | Resp 14 | Ht 66.5 in | Wt 256.0 lb

## 2020-11-02 DIAGNOSIS — Z975 Presence of (intrauterine) contraceptive device: Secondary | ICD-10-CM | POA: Diagnosis not present

## 2020-11-02 DIAGNOSIS — N921 Excessive and frequent menstruation with irregular cycle: Secondary | ICD-10-CM | POA: Diagnosis not present

## 2020-11-02 DIAGNOSIS — N814 Uterovaginal prolapse, unspecified: Secondary | ICD-10-CM | POA: Diagnosis not present

## 2020-11-02 MED ORDER — NORETHINDRONE 0.35 MG PO TABS: 1.0000 | ORAL_TABLET | Freq: Every day | ORAL | 4 refills | Status: DC

## 2020-11-02 NOTE — Progress Notes (Signed)
Tammy Olsen 1975-09-19 [id number]   History:    45 y.o. G2P2L2 Married.  SVD x 2.  RP:  New patient presenting for pressure at the vulva with cervix felt  HPI: BTB on Mirena IUD x insertion of new one 03/2020.  Recently, started feeling vaginal pressure and the presence of the cervix at the vulva when sitting at the restroom and exercising.  No problem with IC.  Urine/BMs normal.  Stopped Estrogen/Progestin BCPs because of high BP many years ago and started on Progesterone IUD at that time.  Past medical history,surgical history, family history and social history were all reviewed and documented in the EPIC chart.  Gynecologic History No LMP recorded. (Menstrual status: IUD).  Obstetric History OB History  Gravida Para Term Preterm AB Living  2 2 2  0 0 2  SAB IAB Ectopic Multiple Live Births  0 0 0 0 2    # Outcome Date GA Lbr Len/2nd Weight Sex Delivery Anes PTL Lv  2 Term 06/01/11 [redacted]w[redacted]d 05:32 / 00:13 5 lb 15.8 oz (2.716 kg) F Vag-Spont EPI  LIV     Birth Comments: WNL  1 Term 12/30/07 [redacted]w[redacted]d   M Vag-Spont EPI  LIV     ROS: A ROS was performed and pertinent positives and negatives are included in the history.  GENERAL: No fevers or chills. HEENT: No change in vision, no earache, sore throat or sinus congestion. NECK: No pain or stiffness. CARDIOVASCULAR: No chest pain or pressure. No palpitations. PULMONARY: No shortness of breath, cough or wheeze. GASTROINTESTINAL: No abdominal pain, nausea, vomiting or diarrhea, melena or bright red blood per rectum. GENITOURINARY: No urinary frequency, urgency, hesitancy or dysuria. MUSCULOSKELETAL: No joint or muscle pain, no back pain, no recent trauma. DERMATOLOGIC: No rash, no itching, no lesions. ENDOCRINE: No polyuria, polydipsia, no heat or cold intolerance. No recent change in weight. HEMATOLOGICAL: No anemia or easy bruising or bleeding. NEUROLOGIC: No headache, seizures, numbness, tingling or weakness. PSYCHIATRIC: No depression, no  loss of interest in normal activity or change in sleep pattern.     Exam:   BP 132/88 (BP Location: Right Arm, Patient Position: Sitting, Cuff Size: Large)   Pulse 78   Resp 14   Ht 5' 6.5" (1.689 m)   Wt 256 lb (116.1 kg)   BMI 40.70 kg/m   Body mass index is 40.7 kg/m.  General appearance : Well developed well nourished female. No acute distress HEENT: Eyes: no retinal hemorrhage or exudates,  Neck supple, trachea midline, no carotid bruits, no thyroidmegaly Lungs: Clear to auscultation, no rhonchi or wheezes, or rib retractions  Heart: Regular rate and rhythm, no murmurs or gallops Breast:Examined in sitting and supine position were symmetrical in appearance, no palpable masses or tenderness,  no skin retraction, no nipple inversion, no nipple discharge, no skin discoloration, no axillary or supraclavicular lymphadenopathy Abdomen: no palpable masses or tenderness, no rebound or guarding Extremities: no edema or skin discoloration or tenderness  Pelvic: Vulva: Normal             Vagina: No gross lesions or discharge  Cervix: No gross lesions or discharge.  IUD strings short but visible and felt at [facility].  Uterus  AV, normal size, shape and consistency, non-tender and mobile  Adnexa  Without masses or tenderness  Anus: Normal  Gynecologic exam in standing position with valsalva:  Uterine prolapse with cervix felt at the level of the vulva.  Cystocele grade 3/4 (bladder empty)   Assessment/Plan:  45 y.o. female for annual exam   1. Cystocele with uterine prolapse Complete uterine prolapse with the cervix at the level of the vulva in standing position with Valsalva.  Cystocele grade 3/4.  Reinforcing the pelvic floor discussed with patient.  We will start with Kegel exercises.  Will call back if desires referral to physical therapy.  She will treat constipation as needed and avoid as much as possible pushing for bowel movements.  Recommended emptying her bladder before it is too  full to avoid that pressure as well and decrease the risk of progression of the cystocele.  The patient was also instructed to avoid pushing out during fitness activities.  Pessary management discussed including usage, risks and benefits.  Surgical management reviewed with patient, in particular the possibility of doing a sacrocolpopexy with anterior repair.  Patient voiced understanding and agreement with plan.  2. Breakthrough bleeding with IUD Mirena IUD in good position.  Frequent breakthrough bleeding with uncomfortable cramping.  Decision to start on the progestin only birth control pill to attempt diminishing the breakthrough bleeding.  No contraindication to the progestin birth control pill.  Usage reviewed and prescription sent to pharmacy.  Other orders - norethindrone (MICRONOR) 0.35 MG tablet; Take 1 tablet (0.35 mg total) by mouth daily.   Genia Del MD, 2:58 PM 11/02/2020

## 2020-11-03 ENCOUNTER — Encounter: Payer: Self-pay | Admitting: Obstetrics & Gynecology

## 2020-11-09 ENCOUNTER — Ambulatory Visit (INDEPENDENT_AMBULATORY_CARE_PROVIDER_SITE_OTHER): Payer: 59 | Admitting: Family Medicine

## 2020-11-24 ENCOUNTER — Telehealth: Payer: Self-pay | Admitting: Family Medicine

## 2020-11-24 NOTE — Telephone Encounter (Signed)
Knee MRI received from [facility].  Date of service was June 25, 2020.  Impression:  1.Advanced patellofemoral osteoarthritis full-thickness bipolar chondral loss. 2.  Mild to moderate tibiofemoral chondromalacia with mild medial tibial subchondral marrow edema 3.  Meniscal degenerative changes without evidence of tear. 4.  Mild medial joint line soft tissue edema with mild effusion.  Small loose bodies in the posterior medial joint and a large popliteal cyst.  MRI report will be sent to scan.

## 2020-11-24 NOTE — Telephone Encounter (Signed)
Received medical records from [facility].  Operative report: 07/30/2020.  Medial meniscectomy and chondroplasty of the medial femoral condyle, lateral femoral condyle, medial tibial plateau, lateral platelets , patellofemoral groove, posterior surface of the patella.  She had a knee knee aspiration and injection with 40 mg of triamcinolone performed on June 05, 2020.  Knee MRI was not included.  We will send reports to scan.

## 2020-11-25 ENCOUNTER — Ambulatory Visit (INDEPENDENT_AMBULATORY_CARE_PROVIDER_SITE_OTHER): Payer: 59 | Admitting: Family Medicine

## 2020-11-25 ENCOUNTER — Encounter (INDEPENDENT_AMBULATORY_CARE_PROVIDER_SITE_OTHER): Payer: Self-pay | Admitting: Family Medicine

## 2020-11-25 ENCOUNTER — Other Ambulatory Visit: Payer: Self-pay

## 2020-11-25 VITALS — BP 139/87 | HR 79 | Temp 98.0°F | Ht 66.0 in | Wt 254.0 lb

## 2020-11-25 DIAGNOSIS — R1011 Right upper quadrant pain: Secondary | ICD-10-CM | POA: Diagnosis not present

## 2020-11-25 DIAGNOSIS — M25569 Pain in unspecified knee: Secondary | ICD-10-CM

## 2020-11-25 DIAGNOSIS — K219 Gastro-esophageal reflux disease without esophagitis: Secondary | ICD-10-CM

## 2020-11-25 DIAGNOSIS — F411 Generalized anxiety disorder: Secondary | ICD-10-CM

## 2020-11-25 DIAGNOSIS — Z9189 Other specified personal risk factors, not elsewhere classified: Secondary | ICD-10-CM

## 2020-11-25 DIAGNOSIS — I10 Essential (primary) hypertension: Secondary | ICD-10-CM | POA: Diagnosis not present

## 2020-11-25 DIAGNOSIS — E66813 Obesity, class 3: Secondary | ICD-10-CM

## 2020-11-25 DIAGNOSIS — G8929 Other chronic pain: Secondary | ICD-10-CM

## 2020-11-25 DIAGNOSIS — R632 Polyphagia: Secondary | ICD-10-CM

## 2020-11-25 DIAGNOSIS — Z6841 Body Mass Index (BMI) 40.0 and over, adult: Secondary | ICD-10-CM

## 2020-11-25 MED ORDER — LOSARTAN POTASSIUM 50 MG PO TABS: 50.0000 mg | ORAL_TABLET | Freq: Every day | ORAL | 3 refills | Status: DC

## 2020-11-25 MED ORDER — FLUOXETINE HCL 40 MG PO CAPS: 40.0000 mg | ORAL_CAPSULE | Freq: Every day | ORAL | 0 refills | Status: DC

## 2020-11-25 MED ORDER — PANTOPRAZOLE SODIUM 40 MG PO TBEC: 40.0000 mg | DELAYED_RELEASE_TABLET | Freq: Every day | ORAL | 0 refills | Status: DC

## 2020-11-25 NOTE — Progress Notes (Signed)
Chief Complaint:   OBESITY Tammy Olsen is here to discuss her progress with her obesity treatment plan along with follow-up of her obesity related diagnoses.   Today's visit was #: 16 Starting weight: 259 lbs Starting date: 10/17/2019 Today's weight: 254 lbs Today's date: 11/25/2020 Weight change since last visit: +6 lbs Total lbs lost to date: 5 lbs Body mass index is 41 kg/m.  Total weight loss percentage to date: -1.93%  Current Meal Plan: practicing portion control and making smarter food choices, such as increasing vegetables and decreasing simple carbohydrates for 75-80% of the time.  Current Exercise Plan: None.  Interim History:  Karalyn says that she has had 2 of 3 gel injections in her knee.  She is still having pain and popping.  Not walking due to pain.  GYN visit reviewed.  She is on progesterone and is having less bleeding.  She says, "I feel like all I do is eat salads".  Assessment/Plan:   1. Right upper quadrant pain Aveline has known biliary dyskinesia.  She says she never got a call from [facility].  She had another episode of pain after eating ice cream. We will see if another referral is needed.  2. Polyphagia Not optimized. Current treatment: None. She will continue to focus on protein-rich, low simple carbohydrate foods. We reviewed the importance of hydration, regular exercise for stress reduction, and restorative sleep.  Plan:  She will retry phentermine. She has this at home already.  3. Essential hypertension At goal. Medications: Cozaar 50 mg daily.   Plan: Avoid buying foods that are: processed, frozen, or prepackaged to avoid excess salt. We will watch for signs of hypotension as she continues lifestyle modifications.  Will refill Cozaar today, as per below.  BP Readings from Last 3 Encounters:  11/25/20 139/87  11/02/20 132/88  10/26/20 139/81   Lab Results  Component Value Date   CREATININE 0.70 10/17/2019   - Refill losartan (COZAAR) 50 MG tablet;  Take 1 tablet (50 mg total) by mouth daily.  Dispense: 90 tablet; Refill: 3  4. Gastroesophageal reflux disease without esophagitis Valeen is taking Protonix 40 mg daily.  Plan:  The current medical regimen is effective;  continue present plan and medications.  - Refill pantoprazole (PROTONIX) 40 MG tablet; Take 1 tablet (40 mg total) by mouth daily.  Dispense: 90 tablet; Refill: 0  5. Chronic knee pain She is being followed by Ortho and has had 2 of 3 gel injections.  We will continue to follow along as it relates to her weight loss journey.  6. GAD (generalized anxiety disorder) Shalene is taking Prozac 40 mg daily and propranolol 20 mg every 8 hours as needed for anxiety.  Plan:  Continue medications.  Will refill Prozac today, as per below.  - Refill FLUoxetine (PROZAC) 40 MG capsule; Take 1 capsule (40 mg total) by mouth daily.  Dispense: 90 capsule; Refill: 0  7. At risk for activity intolerance Jini was given approximately 8 minutes of counseling today regarding her increased risk for exercise intolerance.  We discussed patient's specific personal and medical issues that raise our concern.  She was advised of strategies to prevent injury and ways to improve her cardiopulmonary fitness levels slowly over time.    8. Obesity, current BMI 41  Course: Tyhesha is currently in the action stage of change. As such, her goal is to continue with weight loss efforts.   Nutrition goals: She has agreed to practicing portion control and making  smarter food choices, such as increasing vegetables and decreasing simple carbohydrates.   Exercise goals:  Chair exercises.  Behavioral modification strategies: increasing lean protein intake, decreasing simple carbohydrates, increasing vegetables, and increasing water intake.  Kesley has agreed to follow-up with our clinic in 4 weeks. She was informed of the importance of frequent follow-up visits to maximize her success with intensive lifestyle  modifications for her multiple health conditions.   Objective:   Blood pressure 139/87, pulse 79, temperature 98 F (36.7 C), temperature source Oral, height 5\' 6"  (1.676 m), weight 254 lb (115.2 kg), SpO2 98 %, unknown if currently breastfeeding. Body mass index is 41 kg/m.  General: Cooperative, alert, well developed, in no acute distress. HEENT: Conjunctivae and lids unremarkable. Cardiovascular: Regular rhythm.  Lungs: Normal work of breathing. Neurologic: No focal deficits.   Lab Results  Component Value Date   CREATININE 0.70 10/17/2019   BUN 8 10/17/2019   NA 140 10/17/2019   K 4.1 10/17/2019   CL 103 10/17/2019   CO2 23 10/17/2019   Lab Results  Component Value Date   ALT 18 10/17/2019   AST 20 10/17/2019   ALKPHOS 94 10/17/2019   BILITOT 0.3 10/17/2019   Lab Results  Component Value Date   HGBA1C 4.9 10/17/2019   HGBA1C 5.1 06/15/2015   HGBA1C 5.0 04/02/2014   Lab Results  Component Value Date   INSULIN 13.8 10/17/2019   Lab Results  Component Value Date   TSH 1.620 10/17/2019   Lab Results  Component Value Date   CHOL 237 (H) 10/17/2019   HDL 52 10/17/2019   LDLCALC 157 (H) 10/17/2019   TRIG 154 (H) 10/17/2019   CHOLHDL 6.3 (H) 06/15/2015   Lab Results  Component Value Date   VD25OH 51.8 10/17/2019   VD25OH 21 (L) 06/15/2015   Lab Results  Component Value Date   WBC 9.2 10/17/2019   HGB 14.7 10/17/2019   HCT 43.7 10/17/2019   MCV 88 10/17/2019   PLT 364 10/17/2019   Lab Results  Component Value Date   IRON 81 10/17/2019   TIBC 357 10/17/2019   FERRITIN 101 10/17/2019   Attestation Statements:   Reviewed by clinician on day of visit: allergies, medications, problem list, medical history, surgical history, family history, social history, and previous encounter notes.  I, 12/17/2019, CMA, am acting as transcriptionist for Insurance claims handler, DO  I have reviewed the above documentation for accuracy and completeness, and I agree with the  above. Helane Rima, DO

## 2020-12-19 ENCOUNTER — Other Ambulatory Visit (INDEPENDENT_AMBULATORY_CARE_PROVIDER_SITE_OTHER): Payer: Self-pay | Admitting: Family Medicine

## 2020-12-19 DIAGNOSIS — F411 Generalized anxiety disorder: Secondary | ICD-10-CM

## 2020-12-19 DIAGNOSIS — K219 Gastro-esophageal reflux disease without esophagitis: Secondary | ICD-10-CM

## 2020-12-21 NOTE — Telephone Encounter (Signed)
Dr.Wallace °

## 2020-12-21 NOTE — Telephone Encounter (Signed)
Last seen by Dr. Wallace. 

## 2020-12-30 ENCOUNTER — Encounter (INDEPENDENT_AMBULATORY_CARE_PROVIDER_SITE_OTHER): Payer: Self-pay | Admitting: Family Medicine

## 2020-12-30 ENCOUNTER — Other Ambulatory Visit: Payer: Self-pay

## 2020-12-30 ENCOUNTER — Ambulatory Visit (INDEPENDENT_AMBULATORY_CARE_PROVIDER_SITE_OTHER): Payer: 59 | Admitting: Family Medicine

## 2020-12-30 VITALS — BP 144/84 | HR 83 | Temp 98.0°F | Ht 66.0 in | Wt 256.0 lb

## 2020-12-30 DIAGNOSIS — E88819 Insulin resistance, unspecified: Secondary | ICD-10-CM

## 2020-12-30 DIAGNOSIS — I10 Essential (primary) hypertension: Secondary | ICD-10-CM | POA: Diagnosis not present

## 2020-12-30 DIAGNOSIS — R109 Unspecified abdominal pain: Secondary | ICD-10-CM | POA: Diagnosis not present

## 2020-12-30 DIAGNOSIS — M25562 Pain in left knee: Secondary | ICD-10-CM | POA: Diagnosis not present

## 2020-12-30 DIAGNOSIS — Z6841 Body Mass Index (BMI) 40.0 and over, adult: Secondary | ICD-10-CM

## 2020-12-30 DIAGNOSIS — E8881 Metabolic syndrome: Secondary | ICD-10-CM | POA: Diagnosis not present

## 2020-12-30 DIAGNOSIS — F419 Anxiety disorder, unspecified: Secondary | ICD-10-CM

## 2020-12-30 MED ORDER — TIRZEPATIDE 2.5 MG/0.5ML ~~LOC~~ SOAJ: 2.5000 mg | SUBCUTANEOUS | 0 refills | Status: DC

## 2021-01-04 ENCOUNTER — Encounter (INDEPENDENT_AMBULATORY_CARE_PROVIDER_SITE_OTHER): Payer: Self-pay

## 2021-01-04 NOTE — Progress Notes (Signed)
Chief Complaint:   OBESITY Tammy Olsen is here to discuss her progress with her obesity treatment plan along with follow-up of her obesity related diagnoses. See Medical Weight Management Flowsheet for complete bioelectrical impedance results.  Today's visit was #: 17 Starting weight: 259 lbs Starting date: 10/17/2019 Weight change since last visit: +2 lbs Total lbs lost to date: 3 lbs Total weight loss percentage to date: -1.16%  Nutrition Plan: Practicing portion control and making smarter food choices, such as increasing vegetables and decreasing simple carbohydrates for 70-80% of the time. Activity: Strength training for 20-30 minutes 2-3 times per week.  Interim History:   Tammy Olsen is going to see Ortho in the first week of November.  She says she had some ice cream and then the next day had pain in her lower abdomen along with bloating and diarrhea. She is not checking her blood pressure at home.  Assessment/Plan:   1. Insulin resistance, with polyphagia At goal. Goal is HgbA1c < 5.7, fasting insulin closer to 5.  Medication: None.    Plan:  Start Mounjaro 2.5 mg subcutaneously weekly, as per below.  She will continue to focus on protein-rich, low simple carbohydrate foods. We reviewed the importance of hydration, regular exercise for stress reduction, and restorative sleep.   Lab Results  Component Value Date   HGBA1C 4.9 10/17/2019   Lab Results  Component Value Date   INSULIN 13.8 10/17/2019   - Start tirzepatide [facility]) 2.5 MG/0.5ML Pen; Inject 2.5 mg into the skin once a week.  Dispense: 2 mL; Refill: 0  2. Left knee pain, unspecified chronicity Posterior.  Worse after lots of walking.  Feels that it is a Baker's cyst. She will be seeing Ortho soon.  3. Essential hypertension Elevated. Medications: Cozaar 50 mg daily, propranolol 20 mg every 8 hours.    Plan:  She will check BP daily for 2 weeks and send a MyChart message.  Avoid buying foods that are: processed,  frozen, or prepackaged to avoid excess salt. We will watch for signs of hypotension as she continues lifestyle modifications.  BP Readings from Last 3 Encounters:  12/30/20 (!) 144/84  11/25/20 139/87  11/02/20 132/88   Lab Results  Component Value Date   CREATININE 0.70 10/17/2019   4. Abdominal pain, unspecified abdominal location Still needs follow-up with Surgery.  5. Anxiety, with emotional eating Not at goal. Medication: propranolol 20 mg every 8 hours as needed, Prozac 40 mg daily.   Plan: Discussed cues and consequences, how thoughts affect eating, model of thoughts, feelings, and behaviors, and strategies for change by focusing on the cue. Discussed cognitive distortions, coping thoughts, and how to change your thoughts.  6. Obesity, current BMI 41.4  Course: Tammy Olsen is currently in the action stage of change. As such, her goal is to continue with weight loss efforts.   Nutrition goals: She has agreed to practicing portion control and making smarter food choices, such as increasing vegetables and decreasing simple carbohydrates.   Exercise goals:  As is.  Behavioral modification strategies: increasing lean protein intake, decreasing simple carbohydrates, increasing vegetables, and increasing water intake.  Smera has agreed to follow-up with our clinic in 4 weeks. She was informed of the importance of frequent follow-up visits to maximize her success with intensive lifestyle modifications for her multiple health conditions.   Objective:   Blood pressure (!) 144/84, pulse 83, temperature 98 F (36.7 C), temperature source Oral, height 5\' 6"  (1.676 m), weight 256 lb (116.1  kg), SpO2 96 %, unknown if currently breastfeeding. Body mass index is 41.32 kg/m.  General: Cooperative, alert, well developed, in no acute distress. HEENT: Conjunctivae and lids unremarkable. Cardiovascular: Regular rhythm.  Lungs: Normal work of breathing. Neurologic: No focal deficits.   Lab  Results  Component Value Date   CREATININE 0.70 10/17/2019   BUN 8 10/17/2019   NA 140 10/17/2019   K 4.1 10/17/2019   CL 103 10/17/2019   CO2 23 10/17/2019   Lab Results  Component Value Date   ALT 18 10/17/2019   AST 20 10/17/2019   ALKPHOS 94 10/17/2019   BILITOT 0.3 10/17/2019   Lab Results  Component Value Date   HGBA1C 4.9 10/17/2019   HGBA1C 5.1 06/15/2015   HGBA1C 5.0 04/02/2014   Lab Results  Component Value Date   INSULIN 13.8 10/17/2019   Lab Results  Component Value Date   TSH 1.620 10/17/2019   Lab Results  Component Value Date   CHOL 237 (H) 10/17/2019   HDL 52 10/17/2019   LDLCALC 157 (H) 10/17/2019   TRIG 154 (H) 10/17/2019   CHOLHDL 6.3 (H) 06/15/2015   Lab Results  Component Value Date   VD25OH 51.8 10/17/2019   VD25OH 21 (L) 06/15/2015   Lab Results  Component Value Date   WBC 9.2 10/17/2019   HGB 14.7 10/17/2019   HCT 43.7 10/17/2019   MCV 88 10/17/2019   PLT 364 10/17/2019   Lab Results  Component Value Date   IRON 81 10/17/2019   TIBC 357 10/17/2019   FERRITIN 101 10/17/2019   Attestation Statements:   Reviewed by clinician on day of visit: allergies, medications, problem list, medical history, surgical history, family history, social history, and previous encounter notes.  I, Insurance claims handler, CMA, am acting as transcriptionist for Helane Rima, DO  I have reviewed the above documentation for accuracy and completeness, and I agree with the above. -  Helane Rima, DO, MS, FAAFP, DABOM - Family and Bariatric Medicine.

## 2021-01-20 ENCOUNTER — Other Ambulatory Visit (INDEPENDENT_AMBULATORY_CARE_PROVIDER_SITE_OTHER): Payer: Self-pay | Admitting: Family Medicine

## 2021-01-20 DIAGNOSIS — K219 Gastro-esophageal reflux disease without esophagitis: Secondary | ICD-10-CM

## 2021-01-20 DIAGNOSIS — F411 Generalized anxiety disorder: Secondary | ICD-10-CM

## 2021-01-20 NOTE — Telephone Encounter (Signed)
Tammy Olsen

## 2021-01-28 ENCOUNTER — Encounter (INDEPENDENT_AMBULATORY_CARE_PROVIDER_SITE_OTHER): Payer: Self-pay | Admitting: Family Medicine

## 2021-01-28 ENCOUNTER — Other Ambulatory Visit: Payer: Self-pay

## 2021-01-28 ENCOUNTER — Ambulatory Visit (INDEPENDENT_AMBULATORY_CARE_PROVIDER_SITE_OTHER): Payer: 59 | Admitting: Family Medicine

## 2021-01-28 VITALS — BP 138/87 | HR 84 | Temp 98.1°F | Ht 66.0 in | Wt 258.0 lb

## 2021-01-28 DIAGNOSIS — I10 Essential (primary) hypertension: Secondary | ICD-10-CM | POA: Diagnosis not present

## 2021-01-28 DIAGNOSIS — Z6841 Body Mass Index (BMI) 40.0 and over, adult: Secondary | ICD-10-CM

## 2021-01-28 DIAGNOSIS — R7301 Impaired fasting glucose: Secondary | ICD-10-CM

## 2021-01-28 DIAGNOSIS — M25562 Pain in left knee: Secondary | ICD-10-CM

## 2021-01-28 DIAGNOSIS — G8929 Other chronic pain: Secondary | ICD-10-CM

## 2021-01-28 DIAGNOSIS — K219 Gastro-esophageal reflux disease without esophagitis: Secondary | ICD-10-CM

## 2021-01-28 MED ORDER — PANTOPRAZOLE SODIUM 40 MG PO TBEC: DELAYED_RELEASE_TABLET | ORAL | 2 refills | Status: DC

## 2021-02-01 ENCOUNTER — Other Ambulatory Visit (INDEPENDENT_AMBULATORY_CARE_PROVIDER_SITE_OTHER): Payer: Self-pay | Admitting: Family Medicine

## 2021-02-01 ENCOUNTER — Encounter (INDEPENDENT_AMBULATORY_CARE_PROVIDER_SITE_OTHER): Payer: Self-pay

## 2021-02-01 ENCOUNTER — Encounter (INDEPENDENT_AMBULATORY_CARE_PROVIDER_SITE_OTHER): Payer: Self-pay | Admitting: Family Medicine

## 2021-02-01 ENCOUNTER — Telehealth (INDEPENDENT_AMBULATORY_CARE_PROVIDER_SITE_OTHER): Payer: Self-pay | Admitting: Family Medicine

## 2021-02-01 DIAGNOSIS — K219 Gastro-esophageal reflux disease without esophagitis: Secondary | ICD-10-CM

## 2021-02-01 MED ORDER — PANTOPRAZOLE SODIUM 40 MG PO TBEC
40.0000 mg | DELAYED_RELEASE_TABLET | Freq: Every day | ORAL | 2 refills | Status: DC
Start: 2021-02-01 — End: 2021-02-01

## 2021-02-01 NOTE — Telephone Encounter (Signed)
Prior authorization was denied for Mounjaro. Patient sent mychart message.  

## 2021-02-01 NOTE — Progress Notes (Signed)
Chief Complaint:   OBESITY Tammy Olsen is here to discuss her progress with her obesity treatment plan along with follow-up of her obesity related diagnoses. See Medical Weight Management Flowsheet for complete bioelectrical impedance results.  Today's visit was #: 18 Starting weight: 259 lbs Starting date: 10/17/2019 Weight change since last visit: +2 lbs Total lbs lost to date: 1 lb Total weight loss percentage to date: -0.39%  Nutrition Plan: Practicing portion control and making smarter food choices, such as increasing vegetables and decreasing simple carbohydrates for 70% of the time. Activity: Strength training for 20-30 minutes 2-3 times per week. Anti-obesity medications: Mounjaro 2.5 mg subcutaneously weekly. Reported side effects: None.  Interim History: Tammy Olsen's blood pressure has been 140-145/85-90. She reports that her knee pain has improved. She has been watching her dairy intake closely. She says she [facility] never called her.  Assessment/Plan:   1. Gastroesophageal reflux disease without esophagitis Tammy Olsen takes Protonix 40 mg every other day for GERD.  Plan:  Refill Protonix 40 mg every other day.  We reviewed the diagnosis of GERD and importance of treatment. We discussed "red flag" symptoms and the importance of follow up if symptoms persisted despite treatment. We reviewed non-pharmacologic management of GERD symptoms: including: caffeine reduction, dietary changes, elevate HOB, NPO after supper, reduction of alcohol intake, tobacco cessation, and weight loss.  - Refill pantoprazole (PROTONIX) 40 MG tablet; Take one tab every other day  Dispense: 30 tablet; Refill: 2  2. Left knee pain, unspecified chronicity Tammy Olsen reports that her knee pain has improved.  She is followed by Sports Medicine.  3. Hypertension Tammy Olsen's ambulatory blood pressures have ranged 140-145/85-90.  She takes losartan 50 mg daily.    BP Readings from Last 3 Encounters:  01/28/21 138/87   12/30/20 (!) 144/84  11/25/20 139/87   Lab Results  Component Value Date   CREATININE 0.70 10/17/2019   4. Impaired fasting glucose, with polyphagia She has not been able to get Mounjaro 2.5 mg from the pharmacy yet, but we are working with the pharmacy to rectify this situation.  5. Class 3 severe obesity with serious comorbidity and body mass index (BMI) of 40.0 to 44.9 in adult, unspecified obesity type [employer]  Course: Tammy Olsen is currently in the action stage of change. As such, her goal is to continue with weight loss efforts.   Nutrition goals: She has agreed to practicing portion control and making smarter food choices, such as increasing vegetables and decreasing simple carbohydrates.   Exercise goals:  As is.  Behavioral modification strategies: increasing lean protein intake, decreasing simple carbohydrates, increasing vegetables, and increasing water intake.  Tammy Olsen has agreed to follow-up with our clinic in 4 weeks. She was informed of the importance of frequent follow-up visits to maximize her success with intensive lifestyle modifications for her multiple health conditions.   Objective:   Blood pressure 138/87, pulse 84, temperature 98.1 F (36.7 C), height 5\' 6"  (1.676 m), weight 258 lb (117 kg), SpO2 96 %, unknown if currently breastfeeding. Body mass index is 41.64 kg/m.  General: Cooperative, alert, well developed, in no acute distress. HEENT: Conjunctivae and lids unremarkable. Cardiovascular: Regular rhythm.  Lungs: Normal work of breathing. Neurologic: No focal deficits.   Lab Results  Component Value Date   CREATININE 0.70 10/17/2019   BUN 8 10/17/2019   NA 140 10/17/2019   K 4.1 10/17/2019   CL 103 10/17/2019   CO2 23 10/17/2019   Lab Results  Component Value Date  ALT 18 10/17/2019   AST 20 10/17/2019   ALKPHOS 94 10/17/2019   BILITOT 0.3 10/17/2019   Lab Results  Component Value Date   HGBA1C 4.9 10/17/2019   HGBA1C 5.1 06/15/2015   HGBA1C  5.0 04/02/2014   Lab Results  Component Value Date   INSULIN 13.8 10/17/2019   Lab Results  Component Value Date   TSH 1.620 10/17/2019   Lab Results  Component Value Date   CHOL 237 (H) 10/17/2019   HDL 52 10/17/2019   LDLCALC 157 (H) 10/17/2019   TRIG 154 (H) 10/17/2019   CHOLHDL 6.3 (H) 06/15/2015   Lab Results  Component Value Date   VD25OH 51.8 10/17/2019   VD25OH 21 (L) 06/15/2015   Lab Results  Component Value Date   WBC 9.2 10/17/2019   HGB 14.7 10/17/2019   HCT 43.7 10/17/2019   MCV 88 10/17/2019   PLT 364 10/17/2019   Lab Results  Component Value Date   IRON 81 10/17/2019   TIBC 357 10/17/2019   FERRITIN 101 10/17/2019   Attestation Statements:   Reviewed by clinician on day of visit: allergies, medications, problem list, medical history, surgical history, family history, social history, and previous encounter notes.  Time spent on visit including pre-visit chart review and post-visit care and documentation was 43 minutes. Time was spent on: Food choices and timing of food intake reviewed today. I discussed a personalized meal plan with the patient that will help her to lose weight and will improve her obesity-related conditions going forward. I performed a medically necessary appropriate examination and/or evaluation. I discussed the assessment and treatment plan with the patient. Motivational interviewing as well as evidence-based interventions for health behavior change were utilized today including the discussion of self monitoring techniques, problem-solving barriers and SMART goal setting techniques.  An exercise prescription was reviewed.  The patient was provided an opportunity to ask questions and all were answered. The patient agreed with the plan and demonstrated an understanding of the instructions. Clinical information was updated and documented in the EMR.  I, Insurance claims handler, CMA, am acting as Energy manager for [employer], DO.  I have reviewed  the above documentation for accuracy and completeness, and I agree with the above. -  Helane Rima, DO, MS, FAAFP, DABOM - Family and Bariatric Medicine.

## 2021-02-03 ENCOUNTER — Encounter (INDEPENDENT_AMBULATORY_CARE_PROVIDER_SITE_OTHER): Payer: Self-pay | Admitting: Family Medicine

## 2021-02-08 NOTE — Telephone Encounter (Signed)
Dr.Wallace °

## 2021-02-17 ENCOUNTER — Encounter (INDEPENDENT_AMBULATORY_CARE_PROVIDER_SITE_OTHER): Payer: Self-pay

## 2021-02-17 ENCOUNTER — Other Ambulatory Visit (INDEPENDENT_AMBULATORY_CARE_PROVIDER_SITE_OTHER): Payer: Self-pay | Admitting: Family Medicine

## 2021-02-17 DIAGNOSIS — F411 Generalized anxiety disorder: Secondary | ICD-10-CM

## 2021-02-17 NOTE — Telephone Encounter (Signed)
Msg sent to pt 

## 2021-03-03 ENCOUNTER — Ambulatory Visit (INDEPENDENT_AMBULATORY_CARE_PROVIDER_SITE_OTHER): Payer: 59 | Admitting: Family Medicine

## 2021-04-15 ENCOUNTER — Ambulatory Visit (INDEPENDENT_AMBULATORY_CARE_PROVIDER_SITE_OTHER): Payer: 59 | Admitting: Family Medicine

## 2021-04-15 ENCOUNTER — Encounter (INDEPENDENT_AMBULATORY_CARE_PROVIDER_SITE_OTHER): Payer: Self-pay | Admitting: Family Medicine

## 2021-04-15 ENCOUNTER — Other Ambulatory Visit: Payer: Self-pay

## 2021-04-15 VITALS — BP 145/82 | HR 75 | Temp 97.8°F | Ht 66.0 in | Wt 259.0 lb

## 2021-04-15 DIAGNOSIS — E88819 Insulin resistance, unspecified: Secondary | ICD-10-CM

## 2021-04-15 DIAGNOSIS — K219 Gastro-esophageal reflux disease without esophagitis: Secondary | ICD-10-CM

## 2021-04-15 DIAGNOSIS — E669 Obesity, unspecified: Secondary | ICD-10-CM

## 2021-04-15 DIAGNOSIS — I10 Essential (primary) hypertension: Secondary | ICD-10-CM | POA: Diagnosis not present

## 2021-04-15 DIAGNOSIS — F411 Generalized anxiety disorder: Secondary | ICD-10-CM | POA: Diagnosis not present

## 2021-04-15 DIAGNOSIS — E8881 Metabolic syndrome: Secondary | ICD-10-CM | POA: Diagnosis not present

## 2021-04-15 DIAGNOSIS — Z6841 Body Mass Index (BMI) 40.0 and over, adult: Secondary | ICD-10-CM

## 2021-04-15 MED ORDER — FLUOXETINE HCL 40 MG PO CAPS: 40.0000 mg | ORAL_CAPSULE | Freq: Every day | ORAL | 3 refills | Status: DC

## 2021-04-15 MED ORDER — LOSARTAN POTASSIUM 50 MG PO TABS: 50.0000 mg | ORAL_TABLET | Freq: Every day | ORAL | 3 refills | Status: DC

## 2021-04-15 MED ORDER — PANTOPRAZOLE SODIUM 40 MG PO TBEC: 40.0000 mg | DELAYED_RELEASE_TABLET | Freq: Every day | ORAL | 3 refills | Status: DC

## 2021-04-19 NOTE — Progress Notes (Signed)
Chief Complaint:   OBESITY Tammy Olsen is here to discuss her progress with her obesity treatment plan along with follow-up of her obesity related diagnoses. See Medical Weight Management Flowsheet for complete bioelectrical impedance results.  Today's visit was #: 19 Starting weight: 259 lbs Starting date: 10/17/2019 Weight change since last visit: +1 lb Total lbs lost to date: 0  Nutrition Plan: Practicing portion control and making smarter food choices, such as increasing vegetables and decreasing simple carbohydrates for 75-80% of the time. Activity: Walking for 45-60 minutes 5 times per week.  Interim History: Tammy Olsen says that GLP-1RA is not covered by her insurance.  She says she has been working on mindful eating.  She has tried topiramate in the past and says she did not tolerate it.  She has increased her walking to 5 times per week.  Assessment/Plan:   1. Essential hypertension Elevated today. Medications: Cozaar 50 mg daily, propranolol 20 mg every 8 hours as needed (anxiety).   Plan:  Continue medications.  Will refill losartan today.  Avoid buying foods that are: processed, frozen, or prepackaged to avoid excess salt. We will watch for signs of hypotension as she continues lifestyle modifications.  BP Readings from Last 3 Encounters:  04/15/21 (!) 145/82  01/28/21 138/87  12/30/20 (!) 144/84   Lab Results  Component Value Date   CREATININE 0.70 10/17/2019   - Refill losartan (COZAAR) 50 MG tablet; Take 1 tablet (50 mg total) by mouth daily.  Dispense: 90 tablet; Refill: 3  2. Insulin resistance, with polyphagia Not optimized. Goal is HgbA1c < 5.7, fasting insulin closer to 5.  Medication: None.    Plan:  She will continue to focus on protein-rich, low simple carbohydrate foods. We reviewed the importance of hydration, regular exercise for stress reduction, and restorative sleep.   Lab Results  Component Value Date   HGBA1C 4.9 10/17/2019   Lab Results   Component Value Date   INSULIN 13.8 10/17/2019   3. Gastroesophageal reflux disease without esophagitis [name] takes Protonix 40 mg daily for GERD.  Will refill today.  We reviewed the diagnosis of GERD and importance of treatment. We discussed "red flag" symptoms and the importance of follow up if symptoms persisted despite treatment. We reviewed non-pharmacologic management of GERD symptoms: including: caffeine reduction, dietary changes, elevate HOB, NPO after supper, reduction of alcohol intake, tobacco cessation, and weight loss.  - Refill pantoprazole (PROTONIX) 40 MG tablet; Take 1 tablet (40 mg total) by mouth daily.  Dispense: 90 tablet; Refill: 3  4. GAD (generalized anxiety disorder) Tammy Olsen is taking Prozac 40 mg daily and propranolol 20 mg every 8 hours as needed for anxiety.  Plan:  Continue current medications.  Will refill Prozac today.  - Refill FLUoxetine (PROZAC) 40 MG capsule; Take 1 capsule (40 mg total) by mouth daily.  Dispense: 90 capsule; Refill: 3  5. Obesity, current BMI 41.8  Course: Tammy Olsen is currently in the action stage of change. As such, her goal is to continue with weight loss efforts.   Nutrition goals: She has agreed to practicing portion control and making smarter food choices, such as increasing vegetables and decreasing simple carbohydrates.   Exercise goals:  As is.  Behavioral modification strategies: increasing lean protein intake, decreasing simple carbohydrates, increasing vegetables, and increasing water intake.  Tammy Olsen has agreed to follow-up with our clinic in 4 weeks. She was informed of the importance of frequent follow-up visits to maximize her success with intensive lifestyle modifications for  her multiple health conditions.   Objective:   Blood pressure (!) 145/82, pulse 75, temperature 97.8 F (36.6 C), temperature source Oral, height 5\' 6"  (1.676 m), weight 259 lb (117.5 kg), SpO2 98 %, unknown if currently breastfeeding. Body mass  index is 41.8 kg/m.  General: Cooperative, alert, well developed, in no acute distress. HEENT: Conjunctivae and lids unremarkable. Cardiovascular: Regular rhythm.  Lungs: Normal work of breathing. Neurologic: No focal deficits.   Lab Results  Component Value Date   CREATININE 0.70 10/17/2019   BUN 8 10/17/2019   NA 140 10/17/2019   K 4.1 10/17/2019   CL 103 10/17/2019   CO2 23 10/17/2019   Lab Results  Component Value Date   ALT 18 10/17/2019   AST 20 10/17/2019   ALKPHOS 94 10/17/2019   BILITOT 0.3 10/17/2019   Lab Results  Component Value Date   HGBA1C 4.9 10/17/2019   HGBA1C 5.1 06/15/2015   HGBA1C 5.0 04/02/2014   Lab Results  Component Value Date   INSULIN 13.8 10/17/2019   Lab Results  Component Value Date   TSH 1.620 10/17/2019   Lab Results  Component Value Date   CHOL 237 (H) 10/17/2019   HDL 52 10/17/2019   LDLCALC 157 (H) 10/17/2019   TRIG 154 (H) 10/17/2019   CHOLHDL 6.3 (H) 06/15/2015   Lab Results  Component Value Date   VD25OH 51.8 10/17/2019   VD25OH 21 (L) 06/15/2015   Lab Results  Component Value Date   WBC 9.2 10/17/2019   HGB 14.7 10/17/2019   HCT 43.7 10/17/2019   MCV 88 10/17/2019   PLT 364 10/17/2019   Lab Results  Component Value Date   IRON 81 10/17/2019   TIBC 357 10/17/2019   FERRITIN 101 10/17/2019   Attestation Statements:   Reviewed by clinician on day of visit: allergies, medications, problem list, medical history, surgical history, family history, social history, and previous encounter notes.  I, 12/17/2019, CMA, am acting as transcriptionist for Insurance claims handler, DO  I have reviewed the above documentation for accuracy and completeness, and I agree with the above. -  Helane Rima, DO, MS, FAAFP, DABOM - Family and Bariatric Medicine.

## 2021-05-12 ENCOUNTER — Encounter (INDEPENDENT_AMBULATORY_CARE_PROVIDER_SITE_OTHER): Payer: Self-pay

## 2021-05-13 ENCOUNTER — Ambulatory Visit (INDEPENDENT_AMBULATORY_CARE_PROVIDER_SITE_OTHER): Payer: 59 | Admitting: Family Medicine

## 2021-05-17 ENCOUNTER — Other Ambulatory Visit: Payer: Self-pay

## 2021-05-17 ENCOUNTER — Encounter (INDEPENDENT_AMBULATORY_CARE_PROVIDER_SITE_OTHER): Payer: Self-pay | Admitting: Family Medicine

## 2021-05-17 ENCOUNTER — Ambulatory Visit (INDEPENDENT_AMBULATORY_CARE_PROVIDER_SITE_OTHER): Payer: 59 | Admitting: Family Medicine

## 2021-05-17 VITALS — BP 141/89 | HR 71 | Temp 98.3°F | Ht 66.0 in | Wt 255.0 lb

## 2021-05-17 DIAGNOSIS — G8929 Other chronic pain: Secondary | ICD-10-CM | POA: Diagnosis not present

## 2021-05-17 DIAGNOSIS — Z6841 Body Mass Index (BMI) 40.0 and over, adult: Secondary | ICD-10-CM

## 2021-05-17 DIAGNOSIS — E88819 Insulin resistance, unspecified: Secondary | ICD-10-CM

## 2021-05-17 DIAGNOSIS — M25562 Pain in left knee: Secondary | ICD-10-CM | POA: Diagnosis not present

## 2021-05-17 DIAGNOSIS — E8881 Metabolic syndrome: Secondary | ICD-10-CM

## 2021-05-17 DIAGNOSIS — E669 Obesity, unspecified: Secondary | ICD-10-CM

## 2021-05-17 DIAGNOSIS — F411 Generalized anxiety disorder: Secondary | ICD-10-CM

## 2021-05-17 MED ORDER — FLUOXETINE HCL 40 MG PO CAPS: 40.0000 mg | ORAL_CAPSULE | Freq: Every day | ORAL | 3 refills | Status: DC

## 2021-05-24 NOTE — Progress Notes (Signed)
Chief Complaint:   OBESITY Tammy Olsen is here to discuss her progress with her obesity treatment plan along with follow-up of her obesity related diagnoses. See Medical Weight Management Flowsheet for complete bioelectrical impedance results.  Today's visit was #: 20 Starting weight: 259 lbs Starting date: 10/17/2019 Weight change since last visit: 4 lbs Total lbs lost to date: 4 lbs Total weight loss percentage to date: -1.54%  Nutrition Plan: Practicing portion control and making smarter food choices, such as increasing vegetables and decreasing simple carbohydrates for 75% of the time. Activity: Walking 2 miles 4-5 times per week.  Interim History: [name] had a recent cortisone injection in her left knee.  She will be starting gel injections.  She says she is not checking her BP at home.  Assessment/Plan:   1. Insulin resistance, with polyphagia Improving, but not optimized. Goal is HgbA1c < 5.7, fasting insulin closer to 5.  Medication: None.    Plan:  She will continue to focus on protein-rich, low simple carbohydrate foods. We reviewed the importance of hydration, regular exercise for stress reduction, and restorative sleep.   Lab Results  Component Value Date   HGBA1C 4.9 10/17/2019   Lab Results  Component Value Date   INSULIN 13.8 10/17/2019   2. Chronic pain of left knee Improving. Continue to follow with Orthopedics.  We will follow along as it pertains to her weight loss journey.  3. GAD (generalized anxiety disorder) Violanda is taking Prozac 40 mg daily and propranolol 20 mg every 8 hours as needed for anxiety.   Plan:  Continue current medications.  Will refill Prozac today.  - Refill FLUoxetine (PROZAC) 40 MG capsule; Take 1 capsule (40 mg total) by mouth daily.  Dispense: 90 capsule; Refill: 3  4. Obesity, current BMI 41.2  Course: [name] is currently in the action stage of change. As such, her goal is to continue with weight loss efforts.   Nutrition goals:  She has agreed to practicing portion control and making smarter food choices, such as increasing vegetables and decreasing simple carbohydrates.   Exercise goals:  As is.  Behavioral modification strategies: increasing lean protein intake, decreasing simple carbohydrates, and increasing vegetables.  [name] has agreed to follow-up with our clinic in 4 weeks. She was informed of the importance of frequent follow-up visits to maximize her success with intensive lifestyle modifications for her multiple health conditions.   Objective:   Blood pressure (!) 141/89, pulse 71, temperature 98.3 F (36.8 C), temperature source Oral, height 5\' 6"  (1.676 m), weight 255 lb (115.7 kg), SpO2 97 %, unknown if currently breastfeeding. Body mass index is 41.16 kg/m.  General: Cooperative, alert, well developed, in no acute distress. HEENT: Conjunctivae and lids unremarkable. Cardiovascular: Regular rhythm.  Lungs: Normal work of breathing. Neurologic: No focal deficits.   Lab Results  Component Value Date   CREATININE 0.70 10/17/2019   BUN 8 10/17/2019   NA 140 10/17/2019   K 4.1 10/17/2019   CL 103 10/17/2019   CO2 23 10/17/2019   Lab Results  Component Value Date   ALT 18 10/17/2019   AST 20 10/17/2019   ALKPHOS 94 10/17/2019   BILITOT 0.3 10/17/2019   Lab Results  Component Value Date   HGBA1C 4.9 10/17/2019   HGBA1C 5.1 06/15/2015   HGBA1C 5.0 04/02/2014   Lab Results  Component Value Date   INSULIN 13.8 10/17/2019   Lab Results  Component Value Date   TSH 1.620 10/17/2019   Lab  Results  Component Value Date   CHOL 237 (H) 10/17/2019   HDL 52 10/17/2019   LDLCALC 157 (H) 10/17/2019   TRIG 154 (H) 10/17/2019   CHOLHDL 6.3 (H) 06/15/2015   Lab Results  Component Value Date   VD25OH 51.8 10/17/2019   VD25OH 21 (L) 06/15/2015   Lab Results  Component Value Date   WBC 9.2 10/17/2019   HGB 14.7 10/17/2019   HCT 43.7 10/17/2019   MCV 88 10/17/2019   PLT 364 10/17/2019    Lab Results  Component Value Date   IRON 81 10/17/2019   TIBC 357 10/17/2019   FERRITIN 101 10/17/2019   Attestation Statements:   Reviewed by clinician on day of visit: allergies, medications, problem list, medical history, surgical history, family history, social history, and previous encounter notes.  I, Insurance claims handler, CMA, am acting as transcriptionist for Helane Rima, DO  I have reviewed the above documentation for accuracy and completeness, and I agree with the above. -  Helane Rima, DO, MS, FAAFP, DABOM - Family and Bariatric Medicine.

## 2021-06-16 ENCOUNTER — Ambulatory Visit (INDEPENDENT_AMBULATORY_CARE_PROVIDER_SITE_OTHER): Payer: 59 | Admitting: Family Medicine

## 2021-06-16 ENCOUNTER — Encounter (INDEPENDENT_AMBULATORY_CARE_PROVIDER_SITE_OTHER): Payer: Self-pay | Admitting: Family Medicine

## 2021-06-16 VITALS — BP 141/93 | HR 76 | Temp 97.8°F | Ht 66.0 in | Wt 255.0 lb

## 2021-06-16 DIAGNOSIS — E669 Obesity, unspecified: Secondary | ICD-10-CM | POA: Diagnosis not present

## 2021-06-16 DIAGNOSIS — F411 Generalized anxiety disorder: Secondary | ICD-10-CM | POA: Diagnosis not present

## 2021-06-16 DIAGNOSIS — R632 Polyphagia: Secondary | ICD-10-CM

## 2021-06-16 DIAGNOSIS — E88819 Insulin resistance, unspecified: Secondary | ICD-10-CM

## 2021-06-16 DIAGNOSIS — E8881 Metabolic syndrome: Secondary | ICD-10-CM | POA: Diagnosis not present

## 2021-06-16 DIAGNOSIS — Z6841 Body Mass Index (BMI) 40.0 and over, adult: Secondary | ICD-10-CM

## 2021-06-16 MED ORDER — PHENTERMINE HCL 37.5 MG PO TABS: 18.7500 mg | ORAL_TABLET | ORAL | 0 refills | Status: DC

## 2021-06-16 NOTE — Progress Notes (Signed)
Chief Complaint:   OBESITY Tammy Olsen is here to discuss her progress with her obesity treatment plan along with follow-up of her obesity related diagnoses. See Medical Weight Management Flowsheet for complete bioelectrical impedance results.  Today's visit was #: 21 Starting weight: 259 lbs Starting date: 10/17/2019 Weight change since last visit: 0 Total lbs lost to date: 4 lbs Total weight loss percentage to date: -1.54%  Nutrition Plan: Practicing portion control and making smarter food choices, such as increasing vegetables and decreasing simple carbohydrates for 80-85% of the time. Activity: Walking for 45 minutes 4-5 times per week.  Interim History: Tammy Olsen says she is frustrated with her lack of weight loss.  She has increased her walking to 20 miles/week and has increased her water intake.  She does PC/[place] 80% of the time and says she gets 8 hours of sleep at night.  No GLP-1RA.  Phentermine/topiramate previously caused abdominal issues.  Assessment/Plan:   1. Polyphagia Not at goal. Current treatment: None.    Plan:  Start phentermine 18.75 mg twice daily.  She will continue to focus on protein-rich, low simple carbohydrate foods. We reviewed the importance of hydration, regular exercise for stress reduction, and restorative sleep.  - Start phentermine (ADIPEX-P) 37.5 MG tablet; Take 0.5 tablets (18.75 mg total) by mouth 2 (two) times daily at 7 am and 12 noon.  Dispense: 30 tablet; Refill: 0  We reviewed potential side effects including insomnia, dry mouth, increased heart rate and blood pressure, and increased anxiety. We reviewed reducing caffeine consumption while taking phentermine, especially if the patient is experiencing side effects. Alternative treatment options were discussed. All questions were answered, and the patient wishes to move forward with this medication.   I have consulted the [place] Controlled Substances Registry for this patient, and feel the risk/benefit  ratio today is favorable for proceeding with this prescription for a controlled substance. The patient understands monitoring parameters and red flags.   2. Insulin resistance, with polyphagia Improving, but not optimized. Goal is HgbA1c < 5.7, fasting insulin closer to 5.  Medication: None.    Plan:  She will continue to focus on protein-rich, low simple carbohydrate foods. We reviewed the importance of hydration, regular exercise for stress reduction, and restorative sleep.   Lab Results  Component Value Date   HGBA1C 4.9 10/17/2019   Lab Results  Component Value Date   INSULIN 13.8 10/17/2019   3. GAD (generalized anxiety disorder) Tammy Olsen is taking Prozac 40 mg daily and propranolol 20 mg every 8 hours as needed for anxiety.   Plan:  Continue current medications.  4. Obesity, current BMI 41.2  Course: Tammy Olsen is currently in the action stage of change. As such, her goal is to continue with weight loss efforts.   Nutrition goals: She has agreed to practicing portion control and making smarter food choices, such as increasing vegetables and decreasing simple carbohydrates.   Exercise goals:  As is.  Behavioral modification strategies: increasing lean protein intake, decreasing simple carbohydrates, increasing vegetables, and increasing water intake.  Tammy Olsen has agreed to follow-up with our clinic in 6 weeks. She was informed of the importance of frequent follow-up visits to maximize her success with intensive lifestyle modifications for her multiple health conditions.   Objective:   Blood pressure (!) 141/93, pulse 76, temperature 97.8 F (36.6 C), temperature source Oral, height 5\' 6"  (1.676 m), weight 255 lb (115.7 kg), SpO2 98 %, unknown if currently breastfeeding. Body mass index is 41.16 kg/m.  General: Cooperative, alert, well developed, in no acute distress. HEENT: Conjunctivae and lids unremarkable. Cardiovascular: Regular rhythm.  Lungs: Normal work of  breathing. Neurologic: No focal deficits.   Lab Results  Component Value Date   CREATININE 0.70 10/17/2019   BUN 8 10/17/2019   NA 140 10/17/2019   K 4.1 10/17/2019   CL 103 10/17/2019   CO2 23 10/17/2019   Lab Results  Component Value Date   ALT 18 10/17/2019   AST 20 10/17/2019   ALKPHOS 94 10/17/2019   BILITOT 0.3 10/17/2019   Lab Results  Component Value Date   HGBA1C 4.9 10/17/2019   HGBA1C 5.1 06/15/2015   HGBA1C 5.0 04/02/2014   Lab Results  Component Value Date   INSULIN 13.8 10/17/2019   Lab Results  Component Value Date   TSH 1.620 10/17/2019   Lab Results  Component Value Date   CHOL 237 (H) 10/17/2019   HDL 52 10/17/2019   LDLCALC 157 (H) 10/17/2019   TRIG 154 (H) 10/17/2019   CHOLHDL 6.3 (H) 06/15/2015   Lab Results  Component Value Date   VD25OH 51.8 10/17/2019   VD25OH 21 (L) 06/15/2015   Lab Results  Component Value Date   WBC 9.2 10/17/2019   HGB 14.7 10/17/2019   HCT 43.7 10/17/2019   MCV 88 10/17/2019   PLT 364 10/17/2019   Lab Results  Component Value Date   IRON 81 10/17/2019   TIBC 357 10/17/2019   FERRITIN 101 10/17/2019   Attestation Statements:   Reviewed by clinician on day of visit: allergies, medications, problem list, medical history, surgical history, family history, social history, and previous encounter notes.  I, Insurance claims handler, CMA, am acting as transcriptionist for Helane Rima, DO  I have reviewed the above documentation for accuracy and completeness, and I agree with the above. -  Helane Rima, DO, MS, FAAFP, DABOM - Family and Bariatric Medicine.

## 2021-08-23 ENCOUNTER — Encounter: Payer: Self-pay | Admitting: Physician Assistant

## 2021-09-15 ENCOUNTER — Ambulatory Visit (INDEPENDENT_AMBULATORY_CARE_PROVIDER_SITE_OTHER): Payer: 59 | Admitting: Physician Assistant

## 2021-09-15 ENCOUNTER — Other Ambulatory Visit (INDEPENDENT_AMBULATORY_CARE_PROVIDER_SITE_OTHER): Payer: 59

## 2021-09-15 ENCOUNTER — Encounter: Payer: Self-pay | Admitting: Physician Assistant

## 2021-09-15 VITALS — BP 124/78 | HR 92 | Ht 66.0 in | Wt 240.6 lb

## 2021-09-15 DIAGNOSIS — R197 Diarrhea, unspecified: Secondary | ICD-10-CM | POA: Diagnosis not present

## 2021-09-15 DIAGNOSIS — R509 Fever, unspecified: Secondary | ICD-10-CM

## 2021-09-15 DIAGNOSIS — R109 Unspecified abdominal pain: Secondary | ICD-10-CM

## 2021-09-15 LAB — CBC WITH DIFFERENTIAL/PLATELET
Basophils Absolute: 0.1 10*3/uL (ref 0.0–0.1)
Basophils Relative: 0.8 % (ref 0.0–3.0)
Eosinophils Absolute: 0.3 10*3/uL (ref 0.0–0.7)
Eosinophils Relative: 4.4 % (ref 0.0–5.0)
HCT: 42.2 % (ref 36.0–46.0)
Hemoglobin: 14.5 g/dL (ref 12.0–15.0)
Lymphocytes Relative: 30.1 % (ref 12.0–46.0)
Lymphs Abs: 2.1 10*3/uL (ref 0.7–4.0)
MCHC: 34.2 g/dL (ref 30.0–36.0)
MCV: 88 fl (ref 78.0–100.0)
Monocytes Absolute: 0.5 10*3/uL (ref 0.1–1.0)
Monocytes Relative: 7.1 % (ref 3.0–12.0)
Neutro Abs: 3.9 10*3/uL (ref 1.4–7.7)
Neutrophils Relative %: 57.6 % (ref 43.0–77.0)
Platelets: 272 10*3/uL (ref 150.0–400.0)
RBC: 4.8 Mil/uL (ref 3.87–5.11)
RDW: 13.7 % (ref 11.5–15.5)
WBC: 6.8 10*3/uL (ref 4.0–10.5)

## 2021-09-15 LAB — COMPREHENSIVE METABOLIC PANEL
ALT: 14 U/L (ref 0–35)
AST: 17 U/L (ref 0–37)
Albumin: 4.4 g/dL (ref 3.5–5.2)
Alkaline Phosphatase: 72 U/L (ref 39–117)
BUN: 12 mg/dL (ref 6–23)
CO2: 25 mEq/L (ref 19–32)
Calcium: 9.3 mg/dL (ref 8.4–10.5)
Chloride: 105 mEq/L (ref 96–112)
Creatinine, Ser: 0.78 mg/dL (ref 0.40–1.20)
GFR: 92.36 mL/min (ref >60.00)
Glucose, Bld: 87 mg/dL (ref 70–99)
Potassium: 4 mEq/L (ref 3.5–5.1)
Sodium: 138 mEq/L (ref 135–145)
Total Bilirubin: 0.7 mg/dL (ref 0.2–1.2)
Total Protein: 7.1 g/dL (ref 6.0–8.3)

## 2021-09-15 LAB — SEDIMENTATION RATE: Sed Rate: 28 mm/hr — ABNORMAL HIGH (ref 0–20)

## 2021-09-15 LAB — C-REACTIVE PROTEIN: CRP: <1 mg/dL (ref 0.5–20.0)

## 2021-09-15 NOTE — Progress Notes (Signed)
Subjective:    Patient ID: Tammy Olsen, female    DOB: 1975/11/07, 46 y.o.   MRN: [id number]  HPI Tammy Olsen is a pleasant 46 year old white female, new to GI today self-referred for evaluation of recent episodes of abdominal pain, cramping and diarrhea. She had previously been seen by Dr. Juanita Craver and had undergone work-up in 2019 and 2021.  She relates having an upper endoscopy which was normal.  She had upper abdominal ultrasound done that showed no evidence of gallstones, no ductal dilation, no gallbladder wall thickening.  She had [facility]CK HIDA scan which did show a reduced ejection fraction of 24%.  Patient has history of hypertension, IBS, GERD, idiopathic intracranial hypertension, and anxiety.  She says she was started on Wegovy in April 2023 and took this for about 6 weeks.  She is currently off due to inability to obtain the correct dosage currently.  She says she felt fine while she was on [facility]. About a week after her last injection towards the end of May she had an episode of what she describes as severe abdominal pain, cramping followed by diarrhea.  The symptoms lasted for a couple of days, she gradually felt better And Then Symptoms Recurred.  She went to the emergency room after onset of symptoms on 08/18/2021 waking up in the middle of the night feeling hot all over, with abdominal cramping and pain, then several hours later with onset of diarrhea.  She felt better the following day and then that night again developed lots of rolling/loud abdominal sounds, belching and burping, then diarrhea.  This episode lasted for days and she continued to have watery diarrhea with several bowel movements per day.  No fever or chills during that episode, finally started feeling better and then the following week had symptoms again very similar and at that time with fatigue and temp of 100.8, belching burping abdominal gurgling cramping and diarrhea that lasted about 5 days.  He did have a course of  amoxicillin mid-May after a dental graft, no other antibiotics and no other new medications. Over the past couple of weeks she has been feeling fine but is very worried about possibility of recurrence of the symptoms and about the etiology of the symptoms. She was seen at urgent care, did not have abdominal imaging, had been given a trial of Bentyl.    Review of Systems Pertinent positive and negative review of systems were noted in the above HPI section.  All other review of systems was otherwise negative.   Outpatient Encounter Medications as of 09/15/2021  Medication Sig   Cholecalciferol (VITAMIN D) 50 MCG (2000 UT) CAPS Take 4,000 Units by mouth.    FLUoxetine (PROZAC) 40 MG capsule Take 1 capsule (40 mg total) by mouth daily.   levonorgestrel (MIRENA, 52 MG,) 20 MCG/24HR IUD Mirena 20 mcg/24 hours (7 yrs) 52 mg intrauterine device  Take 1 insert every day by intrauterine route.   losartan (COZAAR) 50 MG tablet Take 1 tablet (50 mg total) by mouth daily.   Multiple Vitamins-Minerals (WOMENS MULTIVITAMIN PO) Take by mouth.   norethindrone (MICRONOR) 0.35 MG tablet Take 1 tablet (0.35 mg total) by mouth daily.   pantoprazole (PROTONIX) 40 MG tablet Take 1 tablet (40 mg total) by mouth daily.   UNABLE TO FIND Med Name: Ultra Flora 1B   [DISCONTINUED] phentermine (ADIPEX-P) 37.5 MG tablet Take 0.5 tablets (18.75 mg total) by mouth 2 (two) times daily at 7 am and 12 noon. (Patient not taking:  Reported on 09/15/2021)   [DISCONTINUED] propranolol (INDERAL) 20 MG tablet Take 1 tablet (20 mg total) by mouth every 8 (eight) hours as needed (anxiety). (Patient not taking: Reported on 09/15/2021)   No facility-administered encounter medications on file as of 09/15/2021.   Allergies  Allergen Reactions   Phentermine Other (See Comments)    constipation   Patient Active Problem List   Diagnosis Date Noted   GAD (generalized anxiety disorder) 09/29/2020   Polyphagia 09/29/2020   Acid reflux 03/23/2020    Abnormal weight gain 03/23/2020   Abdominal bloating 03/23/2020   Chronic idiopathic constipation 03/23/2020   Epigastric pain 03/23/2020   Irritable bowel syndrome 03/23/2020   Anxiety 01/28/2020   Essential hypertension 01/28/2020   Mixed hyperlipidemia 01/28/2020   Insulin resistance 01/28/2020   Osteoarthritis of left knee 10/19/2017   IIH (idiopathic intracranial hypertension) 02/09/2015   Papilledema 04/02/2014   Optic nerve edema    IUD contraception 08/04/2011   Social History   Socioeconomic History   Marital status: Married    Spouse name: Tammy Olsen   Number of children: 2   Years of education: college   Highest education level: Not on file  Occupational History   Occupation: HR  Tobacco Use   Smoking status: Never   Smokeless tobacco: Never  Vaping Use   Vaping Use: Never used  Substance and Sexual Activity   Alcohol use: No    Alcohol/week: 0.0 standard drinks of alcohol   Drug use: No   Sexual activity: Yes    Birth control/protection: I.U.D.  Other Topics Concern   Not on file  Social History Narrative   Patient lives at home with her husband Mike Craze).   Patient works full time Programmer, applications.   Education college.   Right handed.   Caffeine two cups daily.   Social Determinants of Health   Financial Resource Strain: Not on file  Food Insecurity: Not on file  Transportation Needs: Not on file  Physical Activity: Not on file  Stress: Not on file  Social Connections: Not on file  Intimate Partner Violence: Not on file    Ms. Calixte's family history includes Arthritis in her mother; Bladder Cancer in her father; Cancer - Prostate in her father; Gout in her father.      Objective:    Vitals:   09/15/21 0919  BP: 124/78  Pulse: 92  SpO2: 98%    Physical Exam Well-developed well-nourished female in no acute distress.  Height, Weight,240 BMI38.8  HEENT; nontraumatic normocephalic, EOMI, PE R LA, sclera anicteric. Oropharynx;not  examined today Neck; supple, no JVD Cardiovascular; regular rate and rhythm with S1-S2, no murmur rub or gallop Pulmonary; Clear bilaterally Abdomen; soft,  nondistended, there is tenderness in the right lower quadrant, right mid quadrant and right upper quadrant, no guarding or rebound ,no palpable mass or hepatosplenomegaly, bowel sounds are active Rectal; not done today Skin; benign exam, no jaundice rash or appreciable lesions Extremities; no clubbing cyanosis or edema skin warm and dry Neuro/Psych; alert and oriented x4, grossly nonfocal mood and affect appropriate        Assessment & Plan:   #12 46 year old white female with recent episodes of recurrent abdominal pain, cramping, loud gurgling and rolling in the abdomen, followed by days of diarrhea, belching and burping. Last episode was associated with fatigue and fever to 100.8.  That lasted about 5 days.  She has been feeling well over the past few weeks with no recurrence of symptoms  Etiology not  clear, she had started on Wegovy in April 2023, felt fine while she was on this in her first episode of symptoms started about a week after her last injection.  She has been off [place] since  She also took a course of amoxicillin in mid May after a dental graft.  Relapsing nature of the symptoms with improvement in between not consistent with what is usually seen with an infectious gastroenteritis though possible. Worsening of symptoms once she was off Wegovy not consistent with what is usually seen with known side effects of Wegovy.  Interesting she has persistent right-sided tenderness 3 weeks after the last episode.  We will need to rule out persistent intra-abdominal inflammatory process, rule out colitis, IBD, versus other  #2 prior [facility]CK HIDA scan 2021 with low EF of 24%, ultrasound at that time showing no gallstones.  #3 hypertension 4.  Obesity 5.  Anxiety 6.  GERD stable on pantoprazole 40 mg daily 7.  Hypertension #8  history of idiopathic intracranial hypertension  Plan; CBC with differential, c-Met, sed rate, CRP We will schedule for CT of the abdomen and pelvis with contrast She is encouraged to use Bentyl 10 mg every 6 hours on a as needed basis for any recurrence of abdominal pain/cramping or diarrhea. I have asked her to call for advice if she develops any acute recurrence of symptoms in the interim until CT can be done. I have asked her to stay off of Wegovy for the time being until further GI work-up can be completed. Patient will be established with Dr. Leverne Humbles PA-C 09/15/2021   Cc: Vanessa Kick, MD

## 2021-09-15 NOTE — Patient Instructions (Signed)
If you are age 44 or younger, your body mass index should be between 19-25. Your Body mass index is 38.83 kg/m. If this is out of the aformentioned range listed, please consider follow up with your Primary Care Provider.  ________________________________________________________  The [facility] providers would like to encourage you to use [facility] to communicate with providers for non-urgent requests or questions.  Due to long hold times on the telephone, sending your provider a message by [facility] may be a faster and more efficient way to get a response.  Please allow 48 business hours for a response.  Please remember that this is for non-urgent requests.  _______________________________________________________  Your provider has requested that you go to the basement level for lab work before leaving today. Press "B" on the elevator. The lab is located at the first door on the left as you exit the elevator.  You have been scheduled for a CT scan of the abdomen and pelvis at [facility], 1st floor Radiology. You are scheduled on 09/23/2021  at 8:30 am. You should arrive 15 minutes prior to your appointment time for registration.  Please pick up 2 bottles of contrast from [place] at least 3 days prior to your scan. The solution may taste better if refrigerated, but do NOT add ice or any other liquid to this solution. Shake well before drinking.   Please follow the written instructions below on the day of your exam:   1) Do not eat anything after 4:30 am (4 hours prior to your test)   2) Drink 1 bottle of contrast @ 6:30 am (2 hours prior to your exam)  Remember to shake well before drinking and do NOT pour over ice.     Drink 1 bottle of contrast @ 7:30 am (1 hour prior to your exam)   You may take any medications as prescribed with a small amount of water, if necessary. If you take any of the following medications: METFORMIN, GLUCOPHAGE, GLUCOVANCE, AVANDAMET, RIOMET, FORTAMET, [place]  MET, JANUMET, GLUMETZA or METAGLIP, you MAY be asked to HOLD this medication 48 hours AFTER the exam.   The purpose of you drinking the oral contrast is to aid in the visualization of your intestinal tract. The contrast solution may cause some diarrhea. Depending on your individual set of symptoms, you may also receive an intravenous injection of x-ray contrast/dye. Plan on being at [facility] for 45 minutes or longer, depending on the type of exam you are having performed.   If you have any questions regarding your exam or if you need to reschedule, you may call [facility] at [phone] between the hours of 8:00 am and 5:00 pm, Monday-Friday.   Continue to use Bentyl every 6 hours as needed  Call the office and speak with Amy's Nurse, Eustaquio Maize, if you have any recurrent symptoms.  Thank you for entrusting me with your care and choosing [facility].  Amy Esterwood, PA-C

## 2021-09-16 NOTE — Progress Notes (Signed)
____________________________________________________________  Attending physician addendum:  Thank you for sending this case to me. I have reviewed the entire note and agree with the plan.  It may yet have been a self-limited infectious illness.  Further work-up as outlined warranted.  Amada Jupiter, MD  ____________________________________________________________

## 2021-09-17 ENCOUNTER — Ambulatory Visit (INDEPENDENT_AMBULATORY_CARE_PROVIDER_SITE_OTHER): Payer: 59 | Admitting: Family

## 2021-09-17 ENCOUNTER — Encounter: Payer: Self-pay | Admitting: Family

## 2021-09-17 ENCOUNTER — Telehealth: Payer: Self-pay | Admitting: Family

## 2021-09-17 VITALS — BP 140/84 | HR 83 | Temp 99.2°F | Resp 16 | Ht 66.0 in | Wt 240.0 lb

## 2021-09-17 DIAGNOSIS — K581 Irritable bowel syndrome with constipation: Secondary | ICD-10-CM

## 2021-09-17 DIAGNOSIS — F411 Generalized anxiety disorder: Secondary | ICD-10-CM

## 2021-09-17 DIAGNOSIS — F419 Anxiety disorder, unspecified: Secondary | ICD-10-CM | POA: Diagnosis not present

## 2021-09-17 DIAGNOSIS — M170 Bilateral primary osteoarthritis of knee: Secondary | ICD-10-CM | POA: Diagnosis not present

## 2021-09-17 DIAGNOSIS — G932 Benign intracranial hypertension: Secondary | ICD-10-CM

## 2021-09-17 DIAGNOSIS — E669 Obesity, unspecified: Secondary | ICD-10-CM

## 2021-09-17 DIAGNOSIS — K219 Gastro-esophageal reflux disease without esophagitis: Secondary | ICD-10-CM

## 2021-09-17 DIAGNOSIS — H53009 Unspecified amblyopia, unspecified eye: Secondary | ICD-10-CM

## 2021-09-17 DIAGNOSIS — I10 Essential (primary) hypertension: Secondary | ICD-10-CM

## 2021-09-17 DIAGNOSIS — E782 Mixed hyperlipidemia: Secondary | ICD-10-CM

## 2021-09-17 DIAGNOSIS — Z975 Presence of (intrauterine) contraceptive device: Secondary | ICD-10-CM

## 2021-09-17 NOTE — Telephone Encounter (Signed)
Please contact Dr. Laruth Bouchard office and request a copy of his most recent office visit.

## 2021-09-17 NOTE — Assessment & Plan Note (Signed)
Followed by Dr. [name], GYN.

## 2021-09-17 NOTE — Progress Notes (Signed)
Subjective:   By signing my name below, I, Tammy Olsen, attest that this documentation has been prepared under the direction and in the presence of Tammy Craze, NP  09/17/2021    Patient ID: Tammy Olsen, female    DOB: August 15, 1975, 46 y.o.   MRN: [id number]  Chief Complaint  Patient presents with   New Patient (Initial Visit)    To establish care, no pcp in over 10 years.     HPI Patient is in today for a new patient visit.  She denies having any fever, new moles, congestion, sinus pain, sore throat, chest pain, palpitations, cough, shortness of breath, wheezing, nausea, vomiting, diarrhea, constipation, dysuria, frequency, abdominal pain, hematuria, new muscle pain.  Headaches- She has been experiencing headaches. She has been diagnosed with intercranial idiopathic hypertension by her neurologist. She has not seen neurology since 2017. She was treated with topamax at that time.   Ophthalmology- She has been diagnosed with an amblyopia in the right eye by her ophthalmologist who referred her to a neurologist. She is up to date with her opthalmology visits.  Anxiety- She is dealing with anxiety and reports that it bothers her on a daily basis. She is taking Prozac 40 mg to manage her anxiety. She is content with the Prozac and says that it is managing her anxiety well. She reports that she worries a lot.   Blood pressure- Her blood pressure is slightly elevated but not concerning at this time. BP Readings from Last 3 Encounters:  09/17/21 140/84  09/15/21 124/78  06/16/21 (!) 141/93   GERD- She is taking pantoprazole 40 mg PO to manage her GERD. She reports that she feels like she need to take it forever.   IBS- She has had IBS since she was a teenager. She has had severe episodes of constipation over the past 2 weeks. She has seen a GI specialist about this.  Arthritis- She takes Tylenol for the arthritis in her knees and reports that it manages the pain well. She  is seeing Dr. Valma Cava to manage her arthritis. Her mother has arthritis.   Family history- She has two healthy brothers. She says that she is estranged from her family. She has one 46 -year -old son healthy son and a 21 year old daughter.  Social history- She does not drink alcoholic beverages and does not consume tobacco products. She enjoys outdoor activities.        Past Medical History:  Diagnosis Date   Anxiety    Arthritis    knees   Complication of anesthesia    BP and HR issues "they would not let me sleep because my heart rate would drop"   GERD (gastroesophageal reflux disease)    HA (headache)    IBS (irritable bowel syndrome)    Injury of long thoracic nerve    right   Optic nerve edema    Sinus congestion    Upset stomach    Vitamin D deficiency     Past Surgical History:  Procedure Laterality Date   KNEE ARTHROSCOPY Left 07/30/2020   Dr. Thomasena Edis   NO PAST SURGERIES      Family History  Problem Relation Age of Onset   Arthritis Mother    Cancer - Prostate Father    Gout Father    Bladder Cancer Father    Stomach cancer Neg Hx    Colon cancer Neg Hx    Esophageal cancer Neg Hx    Colon polyps  Neg Hx     Social History   Socioeconomic History   Marital status: Married    Spouse name: Tammy Olsen   Number of children: 2   Years of education: college   Highest education level: Not on file  Occupational History   Occupation: HR  Tobacco Use   Smoking status: Never   Smokeless tobacco: Never  Vaping Use   Vaping Use: Never used  Substance and Sexual Activity   Alcohol use: No    Alcohol/week: 0.0 standard drinks of alcohol   Drug use: No   Sexual activity: Yes    Partners: Male    Birth control/protection: I.U.D.  Other Topics Concern   Not on file  Social History Narrative   Patient lives at home with her husband [name]).   Patient works full time Presenter, broadcasting.   Education college.   Right handed.   Caffeine two cups daily.    One son   Social Determinants of Health   Financial Resource Strain: Not on file  Food Insecurity: Not on file  Transportation Needs: Not on file  Physical Activity: Sufficiently Active (09/17/2021)   Exercise Vital Sign    Days of Exercise per Week: 7 days    Minutes of Exercise per Session: 80 min  Stress: Not on file  Social Connections: Not on file  Intimate Partner Violence: Not on file    Outpatient Medications Prior to Visit  Medication Sig Dispense Refill   Cholecalciferol (VITAMIN D) 50 MCG (2000 UT) CAPS Take 4,000 Units by mouth.      FLUoxetine (PROZAC) 40 MG capsule Take 1 capsule (40 mg total) by mouth daily. 90 capsule 3   levonorgestrel (MIRENA, 52 MG,) 20 MCG/24HR IUD Mirena 20 mcg/24 hours (7 yrs) 52 mg intrauterine device  Take 1 insert every day by intrauterine route.     losartan (COZAAR) 50 MG tablet Take 1 tablet (50 mg total) by mouth daily. 90 tablet 3   Multiple Vitamins-Minerals (WOMENS MULTIVITAMIN PO) Take by mouth.     norethindrone (MICRONOR) 0.35 MG tablet Take 1 tablet (0.35 mg total) by mouth daily. 84 tablet 4   pantoprazole (PROTONIX) 40 MG tablet Take 1 tablet (40 mg total) by mouth daily. 90 tablet 3   UNABLE TO FIND Med Name: Ultra Flora 1B     No facility-administered medications prior to visit.    Allergies  Allergen Reactions   Phentermine Other (See Comments)    constipation    Review of Systems  Constitutional:  Negative for fever.       (-)Cervical adenopathy (-)unexpected weight loss  HENT:  Negative for congestion, sinus pain and sore throat.   Respiratory:  Negative for cough, shortness of breath and wheezing.   Cardiovascular:  Negative for chest pain, palpitations and leg swelling.  Gastrointestinal:  Negative for abdominal pain, constipation, diarrhea, nausea and vomiting.  Genitourinary:  Negative for dysuria, frequency and hematuria.  Musculoskeletal:  Negative for joint pain and myalgias.  Skin:        (-)new moles    Neurological:  Positive for headaches.  Psychiatric/Behavioral:  Negative for depression. The patient is not nervous/anxious.        Objective:    Physical Exam Constitutional:      Appearance: Normal appearance. She is not ill-appearing.  HENT:     Head: Normocephalic and atraumatic.     Right Ear: Ear canal and external ear normal.     Left Ear: Ear canal and external ear  normal.  Eyes:     Extraocular Movements: Extraocular movements intact.     Pupils: Pupils are equal, round, and reactive to light.  Cardiovascular:     Rate and Rhythm: Normal rate and regular rhythm.     Pulses: Normal pulses.     Heart sounds: Normal heart sounds. No murmur heard.    No gallop.  Pulmonary:     Effort: Pulmonary effort is normal. No respiratory distress.     Breath sounds: Normal breath sounds. No wheezing.  Abdominal:     General: Bowel sounds are normal.     Palpations: Abdomen is soft.     Tenderness: There is no abdominal tenderness. There is no guarding.  Musculoskeletal:     Comments: Muscle strength 5/5 on upper and lower extremities.  Lymphadenopathy:     Cervical: No cervical adenopathy.  Skin:    General: Skin is warm and dry.  Neurological:     Mental Status: She is alert and oriented to person, place, and time.     Deep Tendon Reflexes:     Reflex Scores:      Patellar reflexes are 0 on the right side and 2+ on the left side. Psychiatric:        Judgment: Judgment normal.     BP 140/84 (BP Location: Right Arm, Patient Position: Sitting, Cuff Size: Large)   Pulse 83   Temp 99.2 F (37.3 C) (Oral)   Resp 16   Ht 5\' 6"  (1.676 m)   Wt 240 lb (108.9 kg)   SpO2 99%   BMI 38.74 kg/m  Wt Readings from Last 3 Encounters:  09/17/21 240 lb (108.9 kg)  09/15/21 240 lb 9.6 oz (109.1 kg)  06/16/21 255 lb (115.7 kg)    Diabetic Foot Exam - Simple   No data filed    Lab Results  Component Value Date   WBC 6.8 09/15/2021   HGB 14.5 09/15/2021   HCT 42.2  09/15/2021   PLT 272.0 09/15/2021   GLUCOSE 87 09/15/2021   CHOL 237 (H) 10/17/2019   TRIG 154 (H) 10/17/2019   HDL 52 10/17/2019   LDLCALC 157 (H) 10/17/2019   ALT 14 09/15/2021   AST 17 09/15/2021   NA 138 09/15/2021   K 4.0 09/15/2021   CL 105 09/15/2021   CREATININE 0.78 09/15/2021   BUN 12 09/15/2021   CO2 25 09/15/2021   TSH 1.620 10/17/2019   HGBA1C 4.9 10/17/2019   MICROALBUR 0.5 06/15/2015    Lab Results  Component Value Date   TSH 1.620 10/17/2019   Lab Results  Component Value Date   WBC 6.8 09/15/2021   HGB 14.5 09/15/2021   HCT 42.2 09/15/2021   MCV 88.0 09/15/2021   PLT 272.0 09/15/2021   Lab Results  Component Value Date   NA 138 09/15/2021   K 4.0 09/15/2021   CO2 25 09/15/2021   GLUCOSE 87 09/15/2021   BUN 12 09/15/2021   CREATININE 0.78 09/15/2021   BILITOT 0.7 09/15/2021   ALKPHOS 72 09/15/2021   AST 17 09/15/2021   ALT 14 09/15/2021   PROT 7.1 09/15/2021   ALBUMIN 4.4 09/15/2021   CALCIUM 9.3 09/15/2021   GFR 92.36 09/15/2021   Lab Results  Component Value Date   CHOL 237 (H) 10/17/2019   Lab Results  Component Value Date   HDL 52 10/17/2019   Lab Results  Component Value Date   LDLCALC 157 (H) 10/17/2019   Lab Results  Component Value Date  TRIG 154 (H) 10/17/2019   Lab Results  Component Value Date   CHOLHDL 6.3 (H) 06/15/2015   Lab Results  Component Value Date   HGBA1C 4.9 10/17/2019       Assessment & Plan:   Problem List Items Addressed This Visit       Unprioritized   Osteoarthritis of both knees - Primary    Sees Dr. Hart Robinsons at [facility]. Uses tylenol bid which is helpful.        Obesity (BMI 30-39.9)    She is being followed by Dr. Briscoe Deutscher at [facility] for weight loss.       Mixed hyperlipidemia    Lab Results  Component Value Date   CHOL 237 (H) 10/17/2019   HDL 52 10/17/2019   LDLCALC 157 (H) 10/17/2019   TRIG 154 (H) 10/17/2019   CHOLHDL 6.3 (H) 06/15/2015  Not on  statin. Will plan to repeat lipid panel next visit.       IUD contraception    Followed by Dr. Vanessa Kick, GYN.       Irritable bowel syndrome    She predominately suffers from constipation. She recently established with GI. Will defer management to GI.       IIH (idiopathic intracranial hypertension)    Has see [facility] in the past. Her last visit was in 2017.  She says that Dr. Jaynee Eagles told her "everything looks good and that she didn't need to follow back up."  However, when I review her last note, it appears that she was being treated with topamax and follow up was recommended. She does continue to follow with ophthalmology and has been told that "everything is all right" with her eyes.   I will request copies of her records from Dr. Katy Fitch (Ophthalmology). I will also arrange a follow up consultation with Dr. Jaynee Eagles (Neurology).      Relevant Orders   Ambulatory referral to Neurology   GAD (generalized anxiety disorder)    This is stable on prozac 40mg . Continue same.       Essential hypertension    BP Readings from Last 3 Encounters:  09/17/21 140/84  09/15/21 124/78  06/16/21 (!) 141/93  Maintained on losartan 50mg  once daily. Borderline BP. Will continue current dose for now and recheck at next visit.        Anxiety    Stable on prozac 40mg .  Continue same.        Amblyopia   Acid reflux    Stable on pantoprazole. Continue same.        No orders of the defined types were placed in this encounter.   I, Nance Pear, NP, personally preformed the services described in this documentation.  All medical record entries made by the scribe were at my direction and in my presence.  I have reviewed the chart and discharge instructions (if applicable) and agree that the record reflects my personal performance and is accurate and complete. 09/17/2021   I,Mohammed Iqbal,acting as a scribe for Nance Pear, NP.,have documented all relevant documentation on  the behalf of Nance Pear, NP,as directed by  Nance Pear, NP while in the presence of Nance Pear, NP.    Nance Pear, NP

## 2021-09-17 NOTE — Assessment & Plan Note (Signed)
She is being followed by Dr. Helane Rima at [facility] for weight loss.

## 2021-09-17 NOTE — Assessment & Plan Note (Addendum)
BP Readings from Last 3 Encounters:  09/17/21 140/84  09/15/21 124/78  06/16/21 (!) 141/93   Maintained on losartan 50mg  once daily. Borderline BP. Will continue current dose for now and recheck at next visit.

## 2021-09-17 NOTE — Assessment & Plan Note (Addendum)
Has see [facility] in the past. Her last visit was in 2017.  She says that Dr. Lucia Gaskins told her "everything looks good and that she didn't need to follow back up."  However, when I review her last note, it appears that she was being treated with topamax and follow up was recommended. She does continue to follow with ophthalmology and has been told that "everything is all right" with her eyes.   I will request copies of her records from Dr. Dione Booze (Ophthalmology). I will also arrange a follow up consultation with Dr. Lucia Gaskins (Neurology).

## 2021-09-17 NOTE — Assessment & Plan Note (Signed)
Lab Results  Component Value Date   CHOL 237 (H) 10/17/2019   HDL 52 10/17/2019   LDLCALC 157 (H) 10/17/2019   TRIG 154 (H) 10/17/2019   CHOLHDL 6.3 (H) 06/15/2015   Not on statin. Will plan to repeat lipid panel next visit.

## 2021-09-17 NOTE — Telephone Encounter (Signed)
Records release faxed 

## 2021-09-17 NOTE — Assessment & Plan Note (Signed)
Sees Dr. Valma Cava at [facility]. Uses tylenol bid which is helpful.

## 2021-09-17 NOTE — Assessment & Plan Note (Signed)
This is stable on prozac 40mg . Continue same.

## 2021-09-17 NOTE — Assessment & Plan Note (Signed)
Stable on prozac 40mg. Continue same.  

## 2021-09-17 NOTE — Assessment & Plan Note (Signed)
Stable on pantoprazole. Continue same.  

## 2021-09-17 NOTE — Assessment & Plan Note (Signed)
She predominately suffers from constipation. She recently established with GI. Will defer management to GI.

## 2021-09-23 ENCOUNTER — Ambulatory Visit (HOSPITAL_COMMUNITY)
Admission: RE | Admit: 2021-09-23 | Discharge: 2021-09-23 | Disposition: A | Discharge status: Home / Self Care | Payer: 59 | Source: Ambulatory Visit | Attending: Physician Assistant | Admitting: Physician Assistant

## 2021-09-23 DIAGNOSIS — R509 Fever, unspecified: Secondary | ICD-10-CM | POA: Diagnosis present

## 2021-09-23 DIAGNOSIS — R197 Diarrhea, unspecified: Secondary | ICD-10-CM | POA: Insufficient documentation

## 2021-09-23 DIAGNOSIS — R109 Unspecified abdominal pain: Secondary | ICD-10-CM | POA: Diagnosis present

## 2021-09-23 MED ORDER — SODIUM CHLORIDE (PF) 0.9 % IJ SOLN
INTRAMUSCULAR | Status: AC
  Filled 2021-09-23: qty 50

## 2021-09-23 MED ORDER — IOHEXOL 300 MG/ML  SOLN
100.0000 mL | Freq: Once | INTRAMUSCULAR | Status: AC | PRN
  Administered 2021-09-23: 100 mL via INTRAVENOUS

## 2021-10-20 ENCOUNTER — Encounter (INDEPENDENT_AMBULATORY_CARE_PROVIDER_SITE_OTHER): Payer: Self-pay

## 2021-10-28 ENCOUNTER — Ambulatory Visit (INDEPENDENT_AMBULATORY_CARE_PROVIDER_SITE_OTHER): Payer: Self-pay | Admitting: Neurology

## 2021-10-28 ENCOUNTER — Encounter: Payer: Self-pay | Admitting: Neurology

## 2021-10-28 ENCOUNTER — Telehealth: Payer: Self-pay | Admitting: Family

## 2021-10-28 DIAGNOSIS — Z91199 Patient's noncompliance with other medical treatment and regimen due to unspecified reason: Secondary | ICD-10-CM

## 2021-10-28 NOTE — Progress Notes (Signed)
Patient NO SHOWED her appointment today. Precharting as below in case she reschedules:  Follow up 10/28/2021: Patient lost to follow up, here for IDIOPATHIC INTRACRANIAL HYPERTENSION.  I reviewed prior notes, initial work-up in 2016-2017 showed papilledema +2 and bilateral hemorrhages on ophthalmic exam, MRI in March 2016 showed enlarged optic nerve sheaths and partially empty sella although LP pressure was only 21 symptoms improved on Diamox.  She was doing very well on high-dose of Diamox but could not afford it, so we switched her to Topamax, she wanted a second opinion and we sent her to Dr. Dione Booze at [facility], and that was the last time we saw her in December 2017.  Her appointment with Dr. Dione Booze was April 04, 2016 and she was referred to a bariatric clinic.  I do not see anywhere in epic where she seen another neurologist, I will request her last notes from Dr. Dione Booze, I do not see any other neurology and neurosurgery notes "care everywhere".  She was referred here by Ike Bene, I reviewed her notes, patient states that she has been experiencing headaches, she has been diagnosed with IIH in the past, and she has not seen [place] since 2017, at the time she was treated with Topamax, as far as ophthalmology goes, she was diagnosed with amblyopia in the right eye by her ophthalmologist who referred her to a neurologist, and she is up-to-date with her ophthalmology visits.  [name] did a thorough review and did see that our notes did not release patient from our clinic and we were still following her, she to was requesting Dr. Laruth Bouchard notes, appreciate our colleague.  Interval history 02/29/2016:  46 year old female with IIH.  Initial exam showed papilledema +2 and bilateral hemorrhages.  MRI showed enlarged optic nerve sheaths and partially empty sella. LP opening pressure only 21 but symptoms improved on diamox. She doesn't feel well. Her left eye is feeling fuzzy. She has been on  Topiramate 200mg  for 7 weeks. Having some vision changes. Only at night, maybe she is tired. She has haziness of vision. Advised to take out the Mirena IUD. She feels tired, fatigued. She is down to 225, lost 25 pounds.  .  Interval history 10/28/2015:  She stopped the phentermine due to tachycardia. She is trying to lose weight and exercise. She is training and going to the gym 4 days/week. She has lost 11 pounds and inches. She is going to a gym near 10/30/2015 and has a [employer]. She went off of the diamox for alittle bit a full week and noticed her symptoms come back which included vision changes and spots never had significant headaches. She would like to see a new ophthalmologist, Will refer to chris groat.    Interval update 08/28/2015: She is on diamox 500mg  bid. She is doing well. Visual fields are stable. She still has papilledema. She is paying 300.month for the Diamox and wants to try something different. Patient reports is difficult for her to pay the $300 a month for the medication. Discussed that Topamax can also be a medication however it is not as strong of a carbonic anhydrase inhibitor and we frequently need much higher doses to achieve the same results. Also, this puts her at risk for vision loss of the Topamax does not work. Patient would still like to try something different due to the financial concerns of the high co-pay of Diamox.   Interval update 02/09/2015:   She is on the diamox 250mg  three  times a day. She is following with Dr. Cathey Endow at [facility] ophthalmology. Visual fields have been intact. Hemorrhages on opthalmologic exam resolved. Still with mild optic head edema. She saw Dr. Cathey Endow 3 weeks ago. She is improved. Not having any headaches or any symptoms anymore. She is not having anymore side effects to the diamox since going up to 250mg  tid. Will try to increase further. will try to increase to 1000mg  qday of the diamox daily.    Notes from [facility] last seen  11/14 state that her vision is 20/400 in the right and 20/20 in the left. No APD. Mild grade 1 optic disk edema. Visual fields normal in both eyes.    Interval update 720/2016:    She was having significant side effects from the Diamox, now the tingling in the fingers are mostly gone. The floaters in her vision are gone. No blurry vision. No transient visual obscurations. No headaches. Followed up with Dr. 12/14, ophthalmologist recently and VF intact.     Interval update 07/28/2014:   Papilledema was confirmed by [facility] opthalmology, grade 1 papilledema bilaterally visual fields appeared intact. MRI brain and orbits and MRV c/w IIH. She was started on Diamox.Headaches are fine. She is on Diamox 250mg  twice daily. She reports fatigue during the day. She can fall asleep instantly during the day but always about 3pm. No apneic events per husband.    She saw [facility] ophthalmology: On 07/25/2014, Dr. . He stated the patient's uncorrected visual acuity was 20/30 in the right eye and 20 over 20 in the left, stable from her prior visit. Intraocular pressures were 10 in each eye without relative affair and pupillary defect. Anterior segment examination unremarkable and posterior segment examination continues to demonstrate grade 2 papilledema in each eye with resolving hemorrhages. The remainder of the posterior segment all unremarkable. Assessment was that the patient has idiopathic intracranial hypertension was stable nerve elevation although resolving hemorrhages. Visual fields continue to be normal. Plan is to repeat her visual fields and photos in 8 weeks.     Initial visit 04/02/2013:  Tammy Olsen is a 46 y.o. female here as a referral from Dr. 04/04/2013 for Optic nerve edema.    She has been having some blurry vision at the end of the day or with straining on the computer and reading. She is having headaches in the middle of the night but hasn't had a headache in over a week. The  headache is in the back of the head, throbbing, no light sensitiivy or sound sensitivity. She would have them a few times a week resolved with tylenol. The left eye vision is good (20/40) and the right eye she can only see light and moving objects which has been chronic since she was a child if uncorrected. No pain on eye movement. Headaches are not positional however she would get them when she is sleeping and in the recumbent position. Denies nausea/vomiting. No diplopia. No pulsatile hearing changes. No other focal neurologic symptoms.      MRi orbits IMPRESSION:   Equivocal MRI orbits (with and without) demonstrating: 1. Enlarged optic nerve sheaths and partially empty sella. These are non-specific findings, but can be seen in association with idiopathic intracranial hypertension.   2. No abnormal enhancing or compressive optic nerve lesions   MRV IMPRESSION:   Abnormal MRV head (without) demonstrating: 1. The right transverse sinus has a possible focal stenosis (series 14 image 14). The transition from right transverse to sigmoid sinus has  signal drop out (serires 14 image 7) and may be due to stenosis or artifact. The right internal jugular vein have no stenosis. These findings may be due to congential hypoplastic changes, flow artifacts, or may be seen in association with idiopathic intracranial hypertension. Review/correlation of these findings with normal MRI brain findings recently make focal venous sinus thromboses unlikely.   2. Flow related artifact in the posterior inferior superior sagittal sinus. 3. Left sided venous drainage is unremarkable.   MRi brain IMPRESSION:  Unremarkable MRI scan of the brain with and without contrast. Incidental changes of mild chronic paranasal sinusitis.   Review of Systems: Patient complains of symptoms per HPI as well as the following symptoms: no fever, no chills, no SOB, no CP. Pertinent negatives per HPI. All others negative.

## 2021-10-28 NOTE — Telephone Encounter (Signed)
LVM for patient to call back since the one month follow up appointment she had was made in error with another provider. Please schedule CPE with Melissa at patient's earliest convenience.

## 2021-11-01 ENCOUNTER — Ambulatory Visit: Payer: 59 | Admitting: Internal Medicine

## 2021-11-16 ENCOUNTER — Ambulatory Visit (INDEPENDENT_AMBULATORY_CARE_PROVIDER_SITE_OTHER): Payer: 59 | Admitting: Gastroenterology

## 2021-11-16 ENCOUNTER — Other Ambulatory Visit: Payer: 59

## 2021-11-16 ENCOUNTER — Encounter: Payer: Self-pay | Admitting: Gastroenterology

## 2021-11-16 VITALS — BP 110/80 | HR 80 | Ht 66.0 in | Wt 224.1 lb

## 2021-11-16 DIAGNOSIS — R1084 Generalized abdominal pain: Secondary | ICD-10-CM

## 2021-11-16 DIAGNOSIS — R110 Nausea: Secondary | ICD-10-CM

## 2021-11-16 MED ORDER — METOCLOPRAMIDE HCL 5 MG PO TABS: ORAL_TABLET | ORAL | 0 refills | Status: DC

## 2021-11-16 MED ORDER — NA SULFATE-K SULFATE-MG SULF 17.5-3.13-1.6 GM/177ML PO SOLN: 1.0000 | Freq: Once | ORAL | 0 refills | Status: AC

## 2021-11-16 NOTE — Patient Instructions (Signed)
_______________________________________________________  If you are age 44 or older, your body mass index should be between 23-30. Your Body mass index is 36.17 kg/m. If this is out of the aforementioned range listed, please consider follow up with your Primary Care Provider.  If you are age 76 or younger, your body mass index should be between 19-25. Your Body mass index is 36.17 kg/m. If this is out of the aformentioned range listed, please consider follow up with your Primary Care Provider.   ________________________________________________________  The [facility] providers would like to encourage you to use [facility] to communicate with providers for non-urgent requests or questions.  Due to long hold times on the telephone, sending your provider a message by [facility] may be a faster and more efficient way to get a response.  Please allow 48 business hours for a response.  Please remember that this is for non-urgent requests.  _______________________________________________________  Tammy Olsen have been scheduled for an endoscopy and colonoscopy. Please follow the written instructions given to you at your visit today. Please pick up your prep supplies at the pharmacy within the next 1-3 days. If you use inhalers (even only as needed), please bring them with you on the day of your procedure.  Your provider has requested that you go to the basement level for lab work before leaving today. Press "B" on the elevator. The lab is located at the first door on the left as you exit the elevator.  Due to recent changes in healthcare laws, you may see the results of your imaging and laboratory studies on MyChart before your provider has had a chance to review them.  We understand that in some cases there may be results that are confusing or concerning to you. Not all laboratory results come back in the same time frame and the provider may be waiting for multiple results in order to interpret others.  Please give  [place] 48 hours in order for your provider to thoroughly review all the results before contacting the office for clarification of your results.   It was a pleasure to see you today!  Thank you for trusting me with your gastrointestinal care!

## 2021-11-16 NOTE — Progress Notes (Signed)
[facility] Progress Note  Chief Complaint: Abdominal pain  Subjective  History: Tammy Olsen was initially seen here by our PA on 09/15/2021 after having had episodes of abdominal pain nausea low-grade temp and diarrhea within a relatively short period of time, overall clinical picture favoring protracted symptoms of an acute infectious illness.  Clinical details outlined in that extensive note.  She had a prior history of abdominal pain under the care of Dr. Collene Mares with work-up unrevealing except CCK HIDA scan with EF 24%.  Antibiotics for dental work in May of this year.   Dashawn has continued to feel unwell at times.  She tells me the symptoms are similar to what was occurring when she saw Dr. Collene Mares years ago, but seem to be getting more frequent and worse.  The episode in May was the worst she has yet had.  Episodes start with nausea and foul belching, and progressed to generalized abdominal pain with bloating, intestinal noises, cramps.  She typically has constipation for which she has taken fiber and MiraLAX but without much improvement.  When the episodes occur, she will then have loose stool after a day or 2 as things are abating.  She describes the episodes as debilitating requiring her to be home from work until she feels better.  2 such episodes occurred since her last visit here, and she feels well between episodes. She also tells me that she carries a diagnosis of IBS from many years ago, and notes that stress inflames no symptoms, but feels that has been under good control for years with Prozac, and that these episodes seem different.  Episodes are not accompanied by facial/lip swelling, rash or joint swelling.  ROS: Cardiovascular:  no chest pain Respiratory: no dyspnea Major systems negative except as above The patient's Past Medical, Family and Social History were reviewed and are on file in the EMR. Past Medical History:  Diagnosis Date   Anxiety    Arthritis    knees    Complication of anesthesia    BP and HR issues "they would not let me sleep because my heart rate would drop"   GERD (gastroesophageal reflux disease)    HA (headache)    IBS (irritable bowel syndrome)    Injury of long thoracic nerve    right   Optic nerve edema    Sinus congestion    Upset stomach    Vitamin D deficiency     Objective:  Med list reviewed  Current Outpatient Medications:    Cholecalciferol (VITAMIN D) 50 MCG (2000 UT) CAPS, Take 4,000 Units by mouth. , Disp: , Rfl:    FLUoxetine (PROZAC) 40 MG capsule, Take 1 capsule (40 mg total) by mouth daily., Disp: 90 capsule, Rfl: 3   levonorgestrel (MIRENA, 52 MG,) 20 MCG/24HR IUD, Mirena 20 mcg/24 hours (7 yrs) 52 mg intrauterine device  Take 1 insert every day by intrauterine route., Disp: , Rfl:    losartan (COZAAR) 50 MG tablet, Take 1 tablet (50 mg total) by mouth daily., Disp: 90 tablet, Rfl: 3   metoCLOPramide (REGLAN) 5 MG tablet, Take 1 tablet 30 minutes before drinking the colonoscopy prep, Disp: 2 tablet, Rfl: 0   Multiple Vitamins-Minerals (WOMENS MULTIVITAMIN PO), Take by mouth., Disp: , Rfl:    Na Sulfate-K Sulfate-Mg Sulf 17.5-3.13-1.6 GM/177ML SOLN, Take 1 kit by mouth once for 1 dose., Disp: 354 mL, Rfl: 0   norethindrone (MICRONOR) 0.35 MG tablet, Take 1 tablet (0.35 mg total) by mouth daily.,  Disp: 84 tablet, Rfl: 4   pantoprazole (PROTONIX) 40 MG tablet, Take 1 tablet (40 mg total) by mouth daily., Disp: 90 tablet, Rfl: 3   UNABLE TO FIND, Med Name: Ultra Flora 1B, Disp: , Rfl:    Vital signs in last 24 hrs: Vitals:   11/16/21 0817  BP: 110/80  Pulse: 80   Wt Readings from Last 3 Encounters:  11/16/21 224 lb 2 oz (101.7 kg)  09/17/21 240 lb (108.9 kg)  09/15/21 240 lb 9.6 oz (109.1 kg)    Physical Exam  Well-appearing HEENT: sclera anicteric, oral mucosa moist without lesions Neck: supple, no thyromegaly, JVD or lymphadenopathy Cardiac: Regular without murmur,  no peripheral edema Pulm: clear  to auscultation bilaterally, normal RR and effort noted Abdomen: soft, no tenderness, with active bowel sounds. No guarding or palpable hepatosplenomegaly. Skin; warm and dry, no jaundice or rash  Labs:     Latest Ref Rng & Units 09/15/2021   10:35 AM 10/17/2019    1:19 PM 06/15/2015    3:47 PM  CBC  WBC 4.0 - 10.5 K/uL 6.8  9.2  8.1   Hemoglobin 12.0 - 15.0 g/dL 14.5  14.7  14.8   Hematocrit 36.0 - 46.0 % 42.2  43.7  42.9   Platelets 150.0 - 400.0 K/uL 272.0  364  284       Latest Ref Rng & Units 09/15/2021   10:35 AM 10/17/2019    1:19 PM 06/15/2015    3:47 PM  CMP  Glucose 70 - 99 mg/dL 87  83  83   BUN 6 - 23 mg/dL 12  8  11    Creatinine 0.40 - 1.20 mg/dL 0.78  0.70  0.74   Sodium 135 - 145 mEq/L 138  140  138   Potassium 3.5 - 5.1 mEq/L 4.0  4.1  3.9   Chloride 96 - 112 mEq/L 105  103  107   CO2 19 - 32 mEq/L 25  23  22    Calcium 8.4 - 10.5 mg/dL 9.3  9.7  8.8   Total Protein 6.0 - 8.3 g/dL 7.1  7.1  6.4   Total Bilirubin 0.2 - 1.2 mg/dL 0.7  0.3  0.3   Alkaline Phos 39 - 117 U/L 72  94  83   AST 0 - 37 U/L 17  20  15    ALT 0 - 35 U/L 14  18  11     ESR mildly elevated at 28, CRP nml ___________________________________________ Radiologic studies:  CLINICAL DATA:  Diarrhea   EXAM: CT ABDOMEN AND PELVIS WITH CONTRAST   TECHNIQUE: Multidetector CT imaging of the abdomen and pelvis was performed using the standard protocol following bolus administration of intravenous contrast.   RADIATION DOSE REDUCTION: This exam was performed according to the departmental dose-optimization program which includes automated exposure control, adjustment of the mA and/or kV according to patient size and/or use of iterative reconstruction technique.   CONTRAST:  173m OMNIPAQUE IOHEXOL 300 MG/ML  SOLN   COMPARISON:  None Available.   FINDINGS: Lower chest: No acute abnormality.   Hepatobiliary: Liver is mildly enlarged measuring 18.6 cm in length. No hepatic mass identified.  Gallbladder appears normal. No biliary ductal dilatation.   Pancreas: Unremarkable. No pancreatic ductal dilatation or surrounding inflammatory changes.   Spleen: Mildly enlarged measuring 12.8 cm in length.   Adrenals/Urinary Tract: Adrenal glands are unremarkable. Kidneys are normal, without renal calculi, focal lesion, or hydronephrosis. Bladder is unremarkable.   Stomach/Bowel: No bowel obstruction,  free air or pneumatosis. No bowel wall edema or thickening identified. Moderate to large amount of retained fecal material in the colon. Appendix is normal.   Vascular/Lymphatic: No significant vascular findings are present. No enlarged abdominal or pelvic lymph nodes.   Reproductive: IUD in the uterus. No suspicious adnexal mass identified.   Other: No ascites.   Musculoskeletal: Degenerative changes in the lumbar spine. No suspicious bony lesions identified.   IMPRESSION: 1. No acute process identified. 2. Moderate-to-large amount of retained fecal material in the colon. 3. Mild hepatosplenomegaly.     Electronically Signed   By: Ofilia Neas M.D.   On: 09/23/2021 16:40  ____________________________________________ Other:   _____________________________________________ Assessment & Plan  Assessment: Encounter Diagnoses  Name Primary?   Generalized abdominal pain Yes   Nausea in adult    Episodic nature of the symptoms is still puzzling and not typical for gallbladder cause despite prior testing.  Also not typical for IBD or SIBO to be episodic.  Further testing warranted.  She was agreeable after discussion about this.   Plan: EGD and colonoscopy.  The benefits and risks of the planned procedure were described in detail with the patient or (when appropriate) their health care proxy.  Risks were outlined as including, but not limited to, bleeding, infection, perforation, adverse medication reaction leading to cardiac or pulmonary decompensation, pancreatitis  (if ERCP).  The limitation of incomplete mucosal visualization was also discussed.  No guarantees or warranties were given.  Alpha gal and celiac testing  35 minutes were spent on this encounter (including chart review, history/exam, counseling/coordination of care, and documentation) > 50% of that time was spent on counseling and coordination of care.   Nelida Meuse III

## 2021-11-19 LAB — TISSUE TRANSGLUTAMINASE, IGA: (tTG) Ab, IgA: <1 U/mL

## 2021-11-19 LAB — ALPHA GAL IGE: GALACTOSE-ALPHA-1,3-GALACTOSE IGE*: <0.1 kU/L (ref <0.10)

## 2021-11-19 LAB — IGA: Immunoglobulin A: 318 mg/dL — ABNORMAL HIGH (ref 47–310)

## 2021-12-16 ENCOUNTER — Encounter: Payer: Self-pay | Admitting: Gastroenterology

## 2021-12-16 ENCOUNTER — Ambulatory Visit (AMBULATORY_SURGERY_CENTER): Payer: 59 | Admitting: Gastroenterology

## 2021-12-16 VITALS — BP 129/86 | HR 60 | Temp 97.8°F | Resp 16 | Ht 66.0 in | Wt 224.0 lb

## 2021-12-16 DIAGNOSIS — K5730 Diverticulosis of large intestine without perforation or abscess without bleeding: Secondary | ICD-10-CM

## 2021-12-16 DIAGNOSIS — R1084 Generalized abdominal pain: Secondary | ICD-10-CM | POA: Diagnosis present

## 2021-12-16 DIAGNOSIS — R197 Diarrhea, unspecified: Secondary | ICD-10-CM

## 2021-12-16 DIAGNOSIS — R110 Nausea: Secondary | ICD-10-CM | POA: Diagnosis not present

## 2021-12-16 DIAGNOSIS — K5900 Constipation, unspecified: Secondary | ICD-10-CM

## 2021-12-16 DIAGNOSIS — K3189 Other diseases of stomach and duodenum: Secondary | ICD-10-CM | POA: Diagnosis not present

## 2021-12-16 DIAGNOSIS — K529 Noninfective gastroenteritis and colitis, unspecified: Secondary | ICD-10-CM

## 2021-12-16 HISTORY — PX: COLONOSCOPY WITH ESOPHAGOGASTRODUODENOSCOPY (EGD) AND ESOPHAGEAL DILATION (ED): SHX6495

## 2021-12-16 MED ORDER — SODIUM CHLORIDE 0.9 % IV SOLN: 500.0000 mL | Freq: Once | INTRAVENOUS | Status: DC

## 2021-12-16 NOTE — Patient Instructions (Signed)
The biopsies taken today have been sent for pathology.  The results can take 1-3 weeks to receive.  Your next colonoscopy should occur in 10 years.    You may resume your previous diet and medication schedule.  Thank you for allowing [place] to care for you today!!!   YOU HAD AN ENDOSCOPIC PROCEDURE TODAY AT [place]:   Refer to the procedure report that was given to you for any specific questions about what was found during the examination.  If the procedure report does not answer your questions, please call your gastroenterologist to clarify.  If you requested that your care partner not be given the details of your procedure findings, then the procedure report has been included in a sealed envelope for you to review at your convenience later.  YOU SHOULD EXPECT: Some feelings of bloating in the abdomen. Passage of more gas than usual.  Walking can help get rid of the air that was put into your GI tract during the procedure and reduce the bloating. If you had a lower endoscopy (such as a colonoscopy or flexible sigmoidoscopy) you may notice spotting of blood in your stool or on the toilet paper. If you underwent a bowel prep for your procedure, you may not have a normal bowel movement for a few days.  Please Note:  You might notice some irritation and congestion in your nose or some drainage.  This is from the oxygen used during your procedure.  There is no need for concern and it should clear up in a day or so.  SYMPTOMS TO REPORT IMMEDIATELY:  Following lower endoscopy (colonoscopy or flexible sigmoidoscopy):  Excessive amounts of blood in the stool  Significant tenderness or worsening of abdominal pains  Swelling of the abdomen that is new, acute  Fever of 100F or higher  Following upper endoscopy (EGD)  Vomiting of blood or coffee ground material  New chest pain or pain under the shoulder blades  Painful or persistently difficult swallowing  New shortness of  breath  Fever of 100F or higher  Black, tarry-looking stools  For urgent or emergent issues, a gastroenterologist can be reached at any hour by calling [phone]. Do not use MyChart messaging for urgent concerns.    DIET:  We do recommend a small meal at first, but then you may proceed to your regular diet.  Drink plenty of fluids but you should avoid alcoholic beverages for 24 hours.  ACTIVITY:  You should plan to take it easy for the rest of today and you should NOT DRIVE or use heavy machinery until tomorrow (because of the sedation medicines used during the test).    FOLLOW UP: Our staff will call the number listed on your records the next business day following your procedure.  We will call around 7:15- 8:00 am to check on you and address any questions or concerns that you may have regarding the information given to you following your procedure. If we do not reach you, we will leave a message.     If any biopsies were taken you will be contacted by phone or by letter within the next 1-3 weeks.  Please call us at [phone] if you have not heard about the biopsies in 3 weeks.    SIGNATURES/CONFIDENTIALITY: You and/or your care partner have signed paperwork which will be entered into your electronic medical record.  These signatures attest to the fact that that the information above on your After Visit Summary has  been reviewed and is understood.  Full responsibility of the confidentiality of this discharge information lies with you and/or your care-partner.

## 2021-12-16 NOTE — Progress Notes (Signed)
Pt resting comfortably. VSS. Airway intact. SBAR complete to RN. All questions answered.   

## 2021-12-16 NOTE — Progress Notes (Signed)
Pt's states no medical or surgical changes since previsit or office visit. VS assessed by D.T 

## 2021-12-16 NOTE — Op Note (Signed)
[place] Patient Name: Tammy Olsen Procedure Date: 12/16/2021 1:23 PM MRN: [id number] Endoscopist: Mallie Mussel L. Loletha Carrow , MD Age: 46 Referring MD:  Date of Birth: 09-06-1975 Gender: Female Account #: [id number] Procedure:                Upper GI endoscopy Indications:              Generalized abdominal pain, Abdominal bloating,                            Diarrhea, Eructation, Nausea                           episodic, [facility] with                            abnml CCK-HIDA                           recent negative celiac and alpha-gal Ab Medicines:                Monitored Anesthesia Care Procedure:                Pre-Anesthesia Assessment:                           - Prior to the procedure, a History and Physical                            was performed, and patient medications and                            allergies were reviewed. The patient's tolerance of                            previous anesthesia was also reviewed. The risks                            and benefits of the procedure and the sedation                            options and risks were discussed with the patient.                            All questions were answered, and informed consent                            was obtained. Prior Anticoagulants: The patient has                            taken no previous anticoagulant or antiplatelet                            agents. ASA Grade Assessment: II - A patient with  mild systemic disease. After reviewing the risks                            and benefits, the patient was deemed in                            satisfactory condition to undergo the procedure.                           After obtaining informed consent, the endoscope was                            passed under direct vision. Throughout the                            procedure, the patient's blood pressure, pulse, and                             oxygen saturations were monitored continuously. The                            GIF [id number] #[id number] was introduced through the                            mouth, and advanced to the second part of duodenum.                            The upper GI endoscopy was accomplished without                            difficulty. The patient tolerated the procedure                            well. Scope In: Scope Out: Findings:                 The larynx was normal.                           The esophagus was normal.                           The entire examined stomach was normal. Biopsies                            were taken with a cold forceps for histology                            (antrum and body).                           The cardia and gastric fundus were normal on                            retroflexion.  The examined duodenum was normal. Biopsies were                            taken with a cold forceps for histology. Complications:            No immediate complications. Estimated Blood Loss:     Estimated blood loss was minimal. Impression:               - Normal larynx.                           - Normal esophagus.                           - Normal stomach. Biopsied.                           - Normal examined duodenum. Biopsied. Recommendation:           - Patient has a contact number available for                            emergencies. The signs and symptoms of potential                            delayed complications were discussed with the                            patient. Return to normal activities tomorrow.                            Written discharge instructions were provided to the                            patient.                           - Resume previous diet.                           - Continue present medications.                           - Await pathology results.                           - See the other procedure note for  documentation of                            additional recommendations. Tammy Olsen L. Myrtie Neither, MD 12/16/2021 2:00:10 PM This report has been signed electronically.

## 2021-12-16 NOTE — Progress Notes (Signed)
No changes to clinical history since GI office visit on 11/16/21.  The patient is appropriate for an endoscopic procedure in the ambulatory setting.  - Cyndie Woodbeck Danis, MD    

## 2021-12-16 NOTE — Op Note (Signed)
[facility] Patient Name: Tammy Olsen Procedure Date: 12/16/2021 1:22 PM MRN: [id number] Endoscopist: Sherilyn Cooter L. Myrtie Neither , MD Age: 46 Referring MD:  Date of Birth: Dec 28, 1975 Gender: Female Account #: [id number] Procedure:                Colonoscopy Indications:              Generalized abdominal pain, episodic diarrhea (and                            chronic constipation) Medicines:                Monitored Anesthesia Care Procedure:                Pre-Anesthesia Assessment:                           - Prior to the procedure, a History and Physical                            was performed, and patient medications and                            allergies were reviewed. The patient's tolerance of                            previous anesthesia was also reviewed. The risks                            and benefits of the procedure and the sedation                            options and risks were discussed with the patient.                            All questions were answered, and informed consent                            was obtained. Prior Anticoagulants: The patient has                            taken no previous anticoagulant or antiplatelet                            agents. ASA Grade Assessment: II - A patient with                            mild systemic disease. After reviewing the risks                            and benefits, the patient was deemed in                            satisfactory condition to undergo the procedure.  After obtaining informed consent, the colonoscope                            was passed under direct vision. Throughout the                            procedure, the patient's blood pressure, pulse, and                            oxygen saturations were monitored continuously. The                            CF [id number] #[id number] was introduced through the anus                            and advanced to the the terminal  ileum, with                            identification of the appendiceal orifice and IC                            valve. The colonoscopy was performed without                            difficulty. The patient tolerated the procedure                            well. The quality of the bowel preparation was                            excellent. The terminal ileum, ileocecal valve,                            appendiceal orifice, and rectum were photographed. Scope In: 1:37:45 PM Scope Out: 1:52:38 PM Scope Withdrawal Time: 0 hours 9 minutes 59 seconds  Total Procedure Duration: 0 hours 14 minutes 53 seconds  Findings:                 The perianal and digital rectal examinations were                            normal.                           The terminal ileum appeared normal.                           Repeat examination of right colon under NBI                            performed.                           A single diverticulum was found in the ascending  colon.                           Normal mucosa was found in the entire colon.                            Biopsies for histology were taken with a cold                            forceps from the right colon and left colon for                            evaluation of microscopic colitis.                           The exam was otherwise without abnormality on                            direct and retroflexion views. Complications:            No immediate complications. Estimated Blood Loss:     Estimated blood loss was minimal. Impression:               - The examined portion of the ileum was normal.                           - Diverticulosis in the ascending colon.                           - Normal mucosa in the entire examined colon.                            Biopsied.                           - The examination was otherwise normal on direct                            and retroflexion  views. Recommendation:           - Patient has a contact number available for                            emergencies. The signs and symptoms of potential                            delayed complications were discussed with the                            patient. Return to normal activities tomorrow.                            Written discharge instructions were provided to the                            patient.                           -  Resume previous diet.                           - Continue present medications.                           - Await pathology results.                           - Repeat colonoscopy in 10 years for screening                            purposes.                           - See the other procedure note for documentation of                            additional recommendations.                           - If all Bx normal -> lactulose breath testing to                            r/o SIBO. Jasmarie Coppock L. Loletha Carrow, MD 12/16/2021 2:04:30 PM This report has been signed electronically.

## 2021-12-16 NOTE — Progress Notes (Signed)
Called to room to assist during endoscopic procedure.  Patient ID and intended procedure confirmed with present staff. Received instructions for my participation in the procedure from the performing physician.  

## 2021-12-17 ENCOUNTER — Telehealth: Payer: Self-pay

## 2021-12-17 NOTE — Telephone Encounter (Signed)
  Follow up Call-     12/16/2021   12:54 PM  Call back number  Post procedure Call Back phone  # [phone]  Permission to leave phone message Yes     Patient questions:  Do you have a fever, pain , or abdominal swelling? No. Pain Score  0 *  Have you tolerated food without any problems? Yes.    Have you been able to return to your normal activities? Yes.    Do you have any questions about your discharge instructions: Diet   No. Medications  No. Follow up visit  No.  Do you have questions or concerns about your Care? No.  Actions: * If pain score is 4 or above: No action needed, pain <4.

## 2021-12-24 ENCOUNTER — Other Ambulatory Visit: Payer: Self-pay

## 2021-12-24 DIAGNOSIS — R1084 Generalized abdominal pain: Secondary | ICD-10-CM

## 2021-12-24 DIAGNOSIS — R110 Nausea: Secondary | ICD-10-CM

## 2021-12-30 ENCOUNTER — Other Ambulatory Visit: Payer: Self-pay | Admitting: Obstetrics & Gynecology

## 2021-12-30 ENCOUNTER — Other Ambulatory Visit: Payer: Self-pay

## 2021-12-30 DIAGNOSIS — R160 Hepatomegaly, not elsewhere classified: Secondary | ICD-10-CM

## 2021-12-30 DIAGNOSIS — R161 Splenomegaly, not elsewhere classified: Secondary | ICD-10-CM

## 2021-12-30 NOTE — Telephone Encounter (Signed)
Last/only OV: 11/02/2020--nothing scheduled. No mammo seen on file.  Will deny and note that pt needs to make appt for further refills.

## 2022-01-10 ENCOUNTER — Encounter (HOSPITAL_COMMUNITY)
Admission: RE | Admit: 2022-01-10 | Discharge: 2022-01-10 | Disposition: A | Discharge status: Home / Self Care | Payer: 59 | Source: Ambulatory Visit | Attending: Gastroenterology | Admitting: Gastroenterology

## 2022-01-10 ENCOUNTER — Encounter (HOSPITAL_COMMUNITY): Admission: RE | Admit: 2022-01-10 | Payer: 59 | Source: Ambulatory Visit

## 2022-01-10 DIAGNOSIS — R110 Nausea: Secondary | ICD-10-CM | POA: Insufficient documentation

## 2022-01-10 DIAGNOSIS — R1084 Generalized abdominal pain: Secondary | ICD-10-CM | POA: Insufficient documentation

## 2022-01-10 MED ORDER — TECHNETIUM TC 99M MEBROFENIN IV KIT
4.8000 | PACK | Freq: Once | INTRAVENOUS | Status: AC | PRN
  Administered 2022-01-10: 4.8 via INTRAVENOUS

## 2022-01-17 ENCOUNTER — Other Ambulatory Visit: Payer: Self-pay

## 2022-01-17 ENCOUNTER — Ambulatory Visit (HOSPITAL_COMMUNITY): Admission: RE | Admit: 2022-01-17 | Payer: 59 | Source: Ambulatory Visit

## 2022-02-02 ENCOUNTER — Ambulatory Visit (INDEPENDENT_AMBULATORY_CARE_PROVIDER_SITE_OTHER): Payer: 59 | Admitting: Physician Assistant

## 2022-02-02 ENCOUNTER — Encounter: Payer: Self-pay | Admitting: Physician Assistant

## 2022-02-02 VITALS — BP 120/80 | HR 83 | Ht 66.0 in | Wt 217.4 lb

## 2022-02-02 DIAGNOSIS — K589 Irritable bowel syndrome without diarrhea: Secondary | ICD-10-CM | POA: Diagnosis not present

## 2022-02-02 DIAGNOSIS — R1084 Generalized abdominal pain: Secondary | ICD-10-CM

## 2022-02-02 DIAGNOSIS — R109 Unspecified abdominal pain: Secondary | ICD-10-CM

## 2022-02-02 DIAGNOSIS — R110 Nausea: Secondary | ICD-10-CM

## 2022-02-02 DIAGNOSIS — R142 Eructation: Secondary | ICD-10-CM | POA: Diagnosis not present

## 2022-02-02 MED ORDER — ONDANSETRON HCL 4 MG PO TABS: 4.0000 mg | ORAL_TABLET | Freq: Four times a day (QID) | ORAL | 4 refills | Status: DC | PRN

## 2022-02-02 MED ORDER — DICYCLOMINE HCL 10 MG PO CAPS: 10.0000 mg | ORAL_CAPSULE | Freq: Four times a day (QID) | ORAL | 6 refills | Status: DC | PRN

## 2022-02-02 NOTE — Patient Instructions (Addendum)
If you are age 44 or older, your body mass index should be between 23-30. Your Body mass index is 35.09 kg/m. If this is out of the aforementioned range listed, please consider follow up with your Primary Care Provider.  If you are age 65 or younger, your body mass index should be between 19-25. Your Body mass index is 35.09 kg/m. If this is out of the aformentioned range listed, please consider follow up with your Primary Care Provider.   ________________________________________________________  The [facility] providers would like to encourage you to use [facility] to communicate with providers for non-urgent requests or questions.  Due to long hold times on the telephone, sending your provider a message by [facility] may be a faster and more efficient way to get a response.  Please allow 48 business hours for a response.  Please remember that this is for non-urgent requests.   You have been given a testing kit to check for small intestine bacterial overgrowth (SIBO) which is completed by a company named [facility]. Make sure to return your test in the mail using the return mailing label given to you along with the kit. Your demographic and insurance information have already been sent to the company and they should be in contact with you over the next 1-2 weeks regarding this test. [facility] will collect an upfront charge of $99.74 for commercial insurance plans and $209.74 is you are paying cash. Make sure to discuss with [facility] PRIOR to having the test to see if they have gotten information from your insurance company as to how much your testing will cost out of pocket, if any. Please keep in mind that you will be getting a call from phone number [phone] or a similar number. If you do not hear from them within this time frame, please call our office at [phone] or call [facility] directly at [phone].    We have sent the following medications to your pharmacy for  you to pick up at your convenience:  Start Zofran 4 mg take 1 tablet every 6 hours as needed Bentyl 10 mg 1 capsule every 6 hours as needed  Thank you for entrusting me with your care and choosing [facility]  Amy Esterwood PA-C

## 2022-02-02 NOTE — Progress Notes (Signed)
Subjective:    Patient ID: Tomie China, female    DOB: 1975/12/18, 46 y.o.   MRN: [id number]  HPI Simone is a pleasant 46 year old white female, established with Dr. Myrtie Neither who comes in today for follow-up with history of recurrent episodes of sour belching, nausea, bloating, abdominal pain/cramping then diarrhea with episodes generally lasting 2 to 3 days.  She thinks the last episode was at least a month ago.  She is unaware of any specific triggers for these episodes. She has had a fairly extensive work-up recently with EGD and colonoscopy 12/16/2021, EGD was normal and biopsies of the stomach and duodenum were also unremarkable, at colonoscopy she had a single diverticulum in the ascending colon and otherwise negative exam, random biopsies negative. Repeat CCK HIDA scan was done on 01/10/2022 showing an ejection fraction of 36% which is normal Alpha-gal panel negative, celiac panel negative She was to do SIBO testing but we did not have any tests available so has not completed that as yet. She does mention that when her symptoms initially started she had been on Wegovy which was stopped after she had onset of her GI symptoms.  After a washout.  She then had a trial of Ozempic but at about week 4 had another episode, stopped the medication and then a few weeks later had another episode.  She has not gone back on either medication since but is asking whether these repeated episodes may be related to either 1 of those medications.  Review of Systems Pertinent positive and negative review of systems were noted in the above HPI section.  All other review of systems was otherwise negative.   Outpatient Encounter Medications as of 02/02/2022  Medication Sig   Cholecalciferol (VITAMIN D) 50 MCG (2000 UT) CAPS Take 4,000 Units by mouth.    dicyclomine (BENTYL) 10 MG capsule Take 1 capsule (10 mg total) by mouth every 6 (six) hours as needed for spasms.   FLUoxetine (PROZAC) 40 MG capsule Take 1  capsule (40 mg total) by mouth daily.   levonorgestrel (MIRENA, 52 MG,) 20 MCG/24HR IUD Mirena 20 mcg/24 hours (7 yrs) 52 mg intrauterine device  Take 1 insert every day by intrauterine route.   losartan (COZAAR) 50 MG tablet Take 1 tablet (50 mg total) by mouth daily.   metoCLOPramide (REGLAN) 5 MG tablet Take 1 tablet 30 minutes before drinking the colonoscopy prep   Multiple Vitamins-Minerals (WOMENS MULTIVITAMIN PO) Take by mouth.   norethindrone (MICRONOR) 0.35 MG tablet Take 1 tablet (0.35 mg total) by mouth daily.   ondansetron (ZOFRAN) 4 MG tablet Take 1 tablet (4 mg total) by mouth every 6 (six) hours as needed for nausea or vomiting.   pantoprazole (PROTONIX) 40 MG tablet Take 1 tablet (40 mg total) by mouth daily.   UNABLE TO FIND Med Name: Ultra Flora 1B   No facility-administered encounter medications on file as of 02/02/2022.   Allergies  Allergen Reactions   Adipex-P [Phentermine] Other (See Comments)    constipation   Grass Pollen(K-O-R-T-Swt Vern)     Other reaction(s): Not available   Patient Active Problem List   Diagnosis Date Noted   Amblyopia 09/17/2021   Obesity (BMI 30-39.9) 09/17/2021   GAD (generalized anxiety disorder) 09/29/2020   Polyphagia 09/29/2020   Acid reflux 03/23/2020   Abnormal weight gain 03/23/2020   Abdominal bloating 03/23/2020   Chronic idiopathic constipation 03/23/2020   Epigastric pain 03/23/2020   Irritable bowel syndrome 03/23/2020   Anxiety 01/28/2020  Essential hypertension 01/28/2020   Mixed hyperlipidemia 01/28/2020   Insulin resistance 01/28/2020   Osteoarthritis of both knees 10/19/2017   IIH (idiopathic intracranial hypertension) 02/09/2015   Papilledema 04/02/2014   Optic nerve edema    IUD contraception 08/04/2011   Social History   Socioeconomic History   Marital status: Married    Spouse name: [name]   Number of children: 2   Years of education: college   Highest education level: Not on file  Occupational  History   Occupation: HR  Tobacco Use   Smoking status: Never   Smokeless tobacco: Never  Vaping Use   Vaping Use: Never used  Substance and Sexual Activity   Alcohol use: No    Alcohol/week: 0.0 standard drinks of alcohol   Drug use: No   Sexual activity: Yes    Partners: Male    Birth control/protection: I.U.D.  Other Topics Concern   Not on file  Social History Narrative   Patient lives at home with her husband [name]).   Patient works full time Presenter, broadcasting.   Education college.   Right handed.   Caffeine two cups daily.   One son   Social Determinants of Health   Financial Resource Strain: Not on file  Food Insecurity: Not on file  Transportation Needs: Not on file  Physical Activity: Sufficiently Active (09/17/2021)   Exercise Vital Sign    Days of Exercise per Week: 7 days    Minutes of Exercise per Session: 80 min  Stress: Not on file  Social Connections: Not on file  Intimate Partner Violence: Not on file    Ms. Rorie's family history includes Arthritis in her mother; Bladder Cancer in her father; Cancer - Prostate in her father; Gout in her father.      Objective:    Vitals:   02/02/22 0824  BP: 120/80  Pulse: 83  SpO2: 96%    Physical Exam Well-developed well-nourished  WF  in no acute distress.  Height, Weight, 217 BMI 35  HEENT; nontraumatic normocephalic, EOMI, PE R LA, sclera anicteric. Oropharynx; not examined today Neck; supple, no JVD Cardiovascular; regular rate and rhythm with S1-S2, no murmur rub or gallop Pulmonary; Clear bilaterally Abdomen; soft, there is very mild tenderness in the right mid quadrant, no guarding nontender, nondistended, no palpable mass or hepatosplenomegaly, bowel sounds are active Rectal; not done Skin; benign exam, no jaundice rash or appreciable lesions Extremities; no clubbing cyanosis or edema skin warm and dry Neuro/Psych; alert and oriented x4, grossly nonfocal mood and affect appropriate         Assessment & Plan:   #13 46 year old white female with recurrent episodes of belching burping nausea, abdominal pain, cramping bloating and diarrhea.  Last episode was about a month ago, she feels fine in between these episodes with no GI symptoms Work-up to date has been unrevealing including CT of the abdomen pelvis, EGD and colonoscopy, repeat CCK HIDA scan, celiac testing and alpha gal testing  I do not think her symptoms are typical for SIBO as they are very intermittent, however this needs to be ruled out At present symptoms most consistent with episodic IBS. Interesting that she had initial onset of symptoms after starting [facility] and also had recurrence of symptoms while on Ozempic, both have been stopped and both certainly are associated with a host of GI side effects though should not account for the continuance of her symptoms off medication for several months at this point  Plan; we will send  prescription for Zofran 4 mg every 6 hours as needed for nausea, take at onset of symptoms Start trial of dicyclomine 10 mg every 6 hours as needed, at onset of 1 of these episodes advised to take every 6 hours for the first 24 hours, then as needed We will proceed with breath testing for SIBO-kits available today She has decided that she does not want to go back on a GLP-1, which I think is a good decision given she is already dealing with significant intermittent GI issues.  Further recommendations pending results of breath testing  Sammuel Cooper PA-C 02/02/2022   Cc: [name], NP

## 2022-02-04 NOTE — Progress Notes (Signed)
____________________________________________________________  Attending physician addendum:  Thank you for sending this case to me. I have reviewed the entire note and agree with the plan.  She describes these pain episodes as being quite severe, which still raises suspicion for biliary dyskinesia.  Difficult call to make clinically, and there would certainly be uncertainty and a recommendation to have cholecystectomy.  Nevertheless, if SIBO testing negative and episodes persist, I recommend surgical consultation to discuss pros/cons of cholecystectomy.  Amada Jupiter, MD  ____________________________________________________________

## 2022-04-04 ENCOUNTER — Other Ambulatory Visit (HOSPITAL_COMMUNITY): Payer: Self-pay

## 2022-04-04 MED ORDER — NALTREXONE HCL 50 MG PO TABS
50.0000 mg | ORAL_TABLET | Freq: Every day | ORAL | 0 refills | Status: DC
  Filled 2022-04-04: qty 27, 27d supply, fill #0
  Filled 2022-04-06: qty 3, 3d supply, fill #0

## 2022-04-05 ENCOUNTER — Other Ambulatory Visit (HOSPITAL_COMMUNITY): Payer: Self-pay

## 2022-04-06 ENCOUNTER — Other Ambulatory Visit (HOSPITAL_COMMUNITY): Payer: Self-pay

## 2022-04-11 ENCOUNTER — Ambulatory Visit: Payer: 59

## 2022-05-04 ENCOUNTER — Other Ambulatory Visit (HOSPITAL_COMMUNITY): Payer: Self-pay

## 2022-05-04 MED ORDER — NALTREXONE HCL 50 MG PO TABS
50.0000 mg | ORAL_TABLET | Freq: Every day | ORAL | 0 refills | Status: DC
  Filled 2022-05-04 – 2022-05-17 (×2): qty 30, 30d supply, fill #0

## 2022-05-05 ENCOUNTER — Other Ambulatory Visit (HOSPITAL_COMMUNITY): Payer: Self-pay

## 2022-05-09 ENCOUNTER — Other Ambulatory Visit: Payer: Self-pay | Admitting: Physician Assistant

## 2022-05-17 ENCOUNTER — Other Ambulatory Visit (HOSPITAL_COMMUNITY): Payer: Self-pay

## 2022-05-23 ENCOUNTER — Ambulatory Visit: Payer: 59 | Attending: Family Medicine

## 2022-06-10 ENCOUNTER — Other Ambulatory Visit (HOSPITAL_COMMUNITY): Payer: Self-pay

## 2022-06-10 MED ORDER — LISDEXAMFETAMINE DIMESYLATE 10 MG PO CAPS
10.0000 mg | ORAL_CAPSULE | Freq: Every morning | ORAL | 0 refills | Status: DC
  Filled 2022-06-10: qty 30, 30d supply, fill #0

## 2022-09-06 ENCOUNTER — Other Ambulatory Visit: Payer: Self-pay | Admitting: Physician Assistant

## 2022-10-01 ENCOUNTER — Other Ambulatory Visit: Payer: Self-pay | Admitting: Physician Assistant

## 2022-11-10 ENCOUNTER — Other Ambulatory Visit (HOSPITAL_COMMUNITY): Payer: Self-pay

## 2022-11-10 MED ORDER — LISDEXAMFETAMINE DIMESYLATE 20 MG PO CAPS
20.0000 mg | ORAL_CAPSULE | Freq: Every morning | ORAL | 0 refills | Status: DC
  Filled 2022-11-10: qty 30, 30d supply, fill #0

## 2023-01-07 ENCOUNTER — Other Ambulatory Visit: Payer: Self-pay | Admitting: Physician Assistant

## 2023-02-05 ENCOUNTER — Other Ambulatory Visit: Payer: Self-pay | Admitting: Physician Assistant

## 2023-02-09 ENCOUNTER — Other Ambulatory Visit: Payer: Self-pay | Admitting: Physician Assistant

## 2023-03-13 ENCOUNTER — Other Ambulatory Visit: Payer: Self-pay | Admitting: Physician Assistant

## 2023-03-17 ENCOUNTER — Other Ambulatory Visit: Payer: Self-pay | Admitting: Physician Assistant

## 2023-10-16 ENCOUNTER — Encounter: Payer: Self-pay | Admitting: Obstetrics

## 2023-10-16 ENCOUNTER — Ambulatory Visit (INDEPENDENT_AMBULATORY_CARE_PROVIDER_SITE_OTHER): Admitting: Obstetrics

## 2023-10-16 VITALS — BP 122/68 | HR 69 | Ht 66.0 in | Wt 191.0 lb

## 2023-10-16 DIAGNOSIS — N393 Stress incontinence (female) (male): Secondary | ICD-10-CM | POA: Insufficient documentation

## 2023-10-16 DIAGNOSIS — E669 Obesity, unspecified: Secondary | ICD-10-CM | POA: Diagnosis not present

## 2023-10-16 DIAGNOSIS — Z975 Presence of (intrauterine) contraceptive device: Secondary | ICD-10-CM

## 2023-10-16 DIAGNOSIS — N812 Incomplete uterovaginal prolapse: Secondary | ICD-10-CM | POA: Diagnosis not present

## 2023-10-16 DIAGNOSIS — Z6830 Body mass index (BMI) 30.0-30.9, adult: Secondary | ICD-10-CM

## 2023-10-16 DIAGNOSIS — K5904 Chronic idiopathic constipation: Secondary | ICD-10-CM

## 2023-10-16 LAB — POCT URINALYSIS DIP (CLINITEK)
Bilirubin, UA: NEGATIVE
Blood, UA: NEGATIVE
Glucose, UA: NEGATIVE mg/dL
Ketones, POC UA: NEGATIVE mg/dL
Leukocytes, UA: NEGATIVE
Nitrite, UA: NEGATIVE
POC PROTEIN,UA: NEGATIVE
Spec Grav, UA: 1.02 (ref 1.010–1.025)
Urobilinogen, UA: 0.2 U/dL
pH, UA: 6 (ref 5.0–8.0)

## 2023-10-16 NOTE — Assessment & Plan Note (Addendum)
-   POCT UA negative, PVR 11mL - For treatment of stress urinary incontinence,  non-surgical options include expectant management, weight loss, physical therapy, as well as a pessary.  Surgical options include a midurethral sling, Burch urethropexy, and transurethral injection of a bulking agent. - encouraged fluid management, Kegel exercises, and continue fiber supplementation for optimization of bowel movements - we discussed an office procedure with urethral bulking (Bulkamid). We discussed success rate of approximately 70-80% and possible need for second injection. We reviewed that this is not a permanent procedure and the Bulkamid does become less effective over time. Risks reviewed including injury to bladder or urethra, UTI, urinary retention and hematuria.  - Sling: The effectiveness of a midurethral vaginal mesh sling is approximately 85%, and thus, there will be times when you may leak urine after surgery, especially if your bladder is full or if you have a strong cough. There is a balance between making the sling tight enough to treat your leakage but not too tight so that you have long-term difficulty emptying your bladder. A mesh sling will not directly treat overactive bladder/urge incontinence and may worsen it.  There is an FDA safety notification on vaginal mesh procedures for prolapse but NOT mesh slings. We have extensive experience and training with mesh placement and we have close postoperative follow up to identify any potential complications from mesh. It is important to realize that this mesh is a permanent implant that cannot be easily removed. There are rare risks of mesh exposure (2-4%), pain with intercourse (0-7%), and infection (<1%). The risk of mesh exposure if more likely in a woman with risks for poor healing (prior radiation, poorly controlled diabetes, or immunocompromised). The risk of new or worsened chronic pain after mesh implant is more common in women with baseline  chronic pain and/or poorly controlled anxiety or depression. Approximately 2-4% of patients will experience longer-term post-operative voiding dysfunction that may require surgical revision of the sling. We also reviewed that postoperatively, her stream may not be as strong as before surgery.  - encouraged pt to return with full bladder for repeat CST

## 2023-10-16 NOTE — Patient Instructions (Addendum)
 You have a stage 3 (out of 4) prolapse.  We discussed the fact that it is not life threatening but there are several treatment options. For treatment of pelvic organ prolapse, we discussed options for management including expectant management, conservative management, and surgical management, such as Kegels, a pessary, pelvic floor physical therapy, and specific surgical procedures.     We discussed two options for prolapse repair:  1) vaginal repair without mesh - Pros - safer, no mesh complications - Cons - not as strong as mesh repair, higher risk of recurrence  2) laparoscopic repair with mesh - Pros - stronger, better long-term success - Cons - risks of mesh implant (erosion into vagina or bladder, adhering to the rectum, pain) - these risks are lower than with a vaginal mesh but still exist  For treatment of stress urinary incontinence, which is leakage with physical activity/movement/strainging/coughing, we discussed expectant management versus nonsurgical options versus surgery. Nonsurgical options include weight loss, physical therapy, as well as a pessary.  Surgical options include a midurethral sling, which is a synthetic mesh sling that acts like a hammock under the urethra to prevent leakage of urine, a Burch urethropexy, and transurethral injection of a bulking agent.   We discussed 2 options: 1) An office procedure with urethral bulking (Bulkamid). We discussed success rate of approximately 70-80% and possible need for second injection. We reviewed that this is not a permanent procedure and the Bulkamid does become less effective over time. Risks reviewed including injury to bladder or urethra, UTI, urinary retention and hematuria.   2) Sling: The effectiveness of a midurethral vaginal mesh sling is approximately 85%, and thus, there will be times when you may leak urine after surgery, especially if your bladder is full or if you have a strong cough. There is a balance between making  the sling tight enough to treat your leakage but not too tight so that you have long-term difficulty emptying your bladder. A mesh sling will not directly treat overactive bladder/urge incontinence and may worsen it.  There is an FDA safety notification on vaginal mesh procedures for prolapse but NOT mesh slings. We have extensive experience and training with mesh placement and we have close postoperative follow up to identify any potential complications from mesh. It is important to realize that this mesh is a permanent implant that cannot be easily removed. There are rare risks of mesh exposure (2-4%), pain with intercourse (0-7%), and infection (<1%). The risk of mesh exposure if more likely in a woman with risks for poor healing (prior radiation, poorly controlled diabetes, or immunocompromised). The risk of new or worsened chronic pain after mesh implant is more common in women with baseline chronic pain and/or poorly controlled anxiety or depression. Approximately 2-4% of patients will experience longer-term post-operative voiding dysfunction that may require surgical revision of the sling. We also reviewed that postoperatively, her stream may not be as strong as before surgery.   Constipation: Our goal is to achieve formed bowel movements daily or every-other-day.  You may need to try different combinations of the following options to find what works best for you - everybody's body works differently so feel free to adjust the dosages as needed.  Some options to help maintain bowel health include:  Dietary changes (more leafy greens, vegetables and fruits; less processed foods) Fiber supplementation (Benefiber, FiberCon, Metamucil or Psyllium). Start slow and increase gradually to full dose. Over-the-counter agents such as: stool softeners (Docusate or Colace) and/or laxatives (Miralax, milk of  magnesia)  Power Pudding is a natural mixture that may help your constipation.  To make blend 1 cup  applesauce, 1 cup wheat bran, and 3/4 cup prune juice, refrigerate and then take 1 tablespoon daily with a large glass of water as needed.   Women should try to eat at least 21 to 25 grams of fiber a day, while men should aim for 30 to 38 grams a day. You can add fiber to your diet with food or a fiber supplement such as psyllium (metamucil), benefiber, or fibercon.   Here's a look at how much dietary fiber is found in some common foods. When buying packaged foods, check the Nutrition Facts label for fiber content. It can vary among brands.  Fruits Serving size Total fiber (grams)*  Raspberries 1 cup 8.0  Pear 1 medium 5.5  Apple, with skin 1 medium 4.5  Banana 1 medium 3.0  Orange 1 medium 3.0  Strawberries 1 cup 3.0   Vegetables Serving size Total fiber (grams)*  Green peas, boiled 1 cup 9.0  Broccoli, boiled 1 cup chopped 5.0  Turnip greens, boiled 1 cup 5.0  Brussels sprouts, boiled 1 cup 4.0  Potato, with skin, baked 1 medium 4.0  Sweet corn, boiled 1 cup 3.5  Cauliflower, raw 1 cup chopped 2.0  Carrot, raw 1 medium 1.5   Grains Serving size Total fiber (grams)*  Spaghetti, whole-wheat, cooked 1 cup 6.0  Barley, pearled, cooked 1 cup 6.0  Bran flakes 3/4 cup 5.5  Quinoa, cooked 1 cup 5.0  Oat bran muffin 1 medium 5.0  Oatmeal, instant, cooked 1 cup 5.0  Popcorn, air-popped 3 cups 3.5  Brown rice, cooked 1 cup 3.5  Bread, whole-wheat 1 slice 2.0  Bread, rye 1 slice 2.0   Legumes, nuts and seeds Serving size Total fiber (grams)*  Split peas, boiled 1 cup 16.0  Lentils, boiled 1 cup 15.5  Black beans, boiled 1 cup 15.0  Baked beans, canned 1 cup 10.0  Chia seeds 1 ounce 10.0  Almonds 1 ounce (23 nuts) 3.5  Pistachios 1 ounce (49 nuts) 3.0  Sunflower kernels 1 ounce 3.0  *Rounded to nearest 0.5 gram. Source: [employer]	[place]

## 2023-10-16 NOTE — Assessment & Plan Note (Addendum)
-   Bristol stool IV daily without straining on benefiber 1 teaspoon/day with IBS - For constipation, we reviewed the importance of a better bowel regimen.  We also discussed the importance of avoiding chronic straining, as it can exacerbate her pelvic floor symptoms; we discussed treating constipation and straining prior to surgery, as postoperative straining can lead to damage to the repair and recurrence of symptoms.  - We discussed continuing therapy with increasing fluid intake, fiber supplementation and titration based on stool consistency - discussed association with pelvic floor disorders

## 2023-10-16 NOTE — Assessment & Plan Note (Addendum)
-   no IUD strings noted on exam - irregular spotting for 6 months - TVUS ordered to evaluate for IUD placement and endometrial lining

## 2023-10-16 NOTE — Assessment & Plan Note (Addendum)
-   discussed association with pelvic floor disorders - continue active lifestyle, exercise and weight reduction

## 2023-10-16 NOTE — Progress Notes (Signed)
 New Patient Evaluation and Consultation  Referring Provider: Okey Leader, MD PCP: Daryl Setter, NP Date of Service: 10/16/2023  SUBJECTIVE Chief Complaint: New Patient (Initial Visit) Tammy Olsen is a 48 y.o. female here today for female organ prolapse.)  History of Present Illness: Tammy Olsen is a 48 y.o. White or Caucasian female seen in consultation at the request of Dr Okey for evaluation of pelvic organ prolapse.    Urinary leakage started 8-9 years ago during exercises with sit-ups  Denies association with medication, surgeries, falls, weight changes. Denies leakage during pregnancy Vaginal bulge noticed 3 years ago, notices golfball size bulge 90% of the time. Denies pain Reports continuous spotting for 6-9 months with IUD in place  Tried self directed pelvic floor exercises, PT at [facility] Stopped Estradiol patch since it would not stay on and norethindrone   Review of records significant for: Complications from anesthesia during pregnancy, GERD, optic nerve inflammation  TVUS  CLINICAL DATA:  Diarrhea   EXAM: CT ABDOMEN AND PELVIS WITH CONTRAST   TECHNIQUE: Multidetector CT imaging of the abdomen and pelvis was performed using the standard protocol following bolus administration of intravenous contrast.   RADIATION DOSE REDUCTION: This exam was performed according to the departmental dose-optimization program which includes automated exposure control, adjustment of the mA and/or kV according to patient size and/or use of iterative reconstruction technique.   CONTRAST:  100mL OMNIPAQUE  IOHEXOL  300 MG/ML  SOLN   COMPARISON:  None Available.   FINDINGS: Lower chest: No acute abnormality.   Hepatobiliary: Liver is mildly enlarged measuring 18.6 cm in length. No hepatic mass identified. Gallbladder appears normal. No biliary ductal dilatation.   Pancreas: Unremarkable. No pancreatic ductal dilatation or surrounding inflammatory  changes.   Spleen: Mildly enlarged measuring 12.8 cm in length.   Adrenals/Urinary Tract: Adrenal glands are unremarkable. Kidneys are normal, without renal calculi, focal lesion, or hydronephrosis. Bladder is unremarkable.   Stomach/Bowel: No bowel obstruction, free air or pneumatosis. No bowel wall edema or thickening identified. Moderate to large amount of retained fecal material in the colon. Appendix is normal.   Vascular/Lymphatic: No significant vascular findings are present. No enlarged abdominal or pelvic lymph nodes.   Reproductive: IUD in the uterus. No suspicious adnexal mass identified.   Other: No ascites.   Musculoskeletal: Degenerative changes in the lumbar spine. No suspicious bony lesions identified.   IMPRESSION: 1. No acute process identified. 2. Moderate-to-large amount of retained fecal material in the colon. 3. Mild hepatosplenomegaly.     Electronically Signed   By: Catarina Pouch M.D.   On: 09/23/2021 16:40  Urinary Symptoms: Leaks urine with cough/ sneeze, laughing, exercise, and lifting Leaks 2 time(s) per days due to spotting Pad use: 2 liners/ mini-pads per day more for spotting Patient is bothered by UI symptoms.  Day time voids 5-7.  Nocturia: 1 times per night to void. Voiding dysfunction:  empties bladder well.  Patient does not use a catheter to empty bladder.  When urinating, patient feels she has no difficulties Drinks: 80-120oz water per day within 2-3 years, 8oz of coffee, 8oz decaf tea  UTIs: 0 UTI's in the last year.   Denies history of blood in urine, kidney or bladder stones, pyelonephritis, bladder cancer, and kidney cancer No results found for the last 90 days.   Pelvic Organ Prolapse Symptoms:                  Patient Admits to a feeling of a bulge the  vaginal area. It has been present for 3 years.  Patient Admits to seeing a bulge.  This bulge is bothersome.  Bowel Symptom: Bowel movements: 1 time(s) per day  with IBS since teenage years Stool consistency: soft , Type IV stool  Straining: no.  Splinting: no.  Incomplete evacuation: no.  Patient Denies accidental bowel leakage / fecal incontinence Bowel regimen:Benefiber 1 teaspoon/day fiber and miralax every 2 days, takes prebiotic and probiotic  Last colonoscopy: Results normal biopsy, diverticulum. Did not proceed with SIBO breath test since RUQ pain resolved after discontinuation of Wegovy [facility] Colonoscopy          Upcoming     Colonoscopy (Every 10 Years) Next due on 12/17/2031    12/16/2021  COLONOSCOPY   Only the first 1 history entries have been loaded, but more history exists.                Sexual Function Sexually active: yes.  Sexual orientation: Straight Pain with sex: No  Pelvic Pain Denies pelvic pain   Past Medical History:  Past Medical History:  Diagnosis Date   Allergy    Anxiety    Arthritis    knees   Complication of anesthesia    BP and HR issues they would not let me sleep because my heart rate would drop   GERD (gastroesophageal reflux disease)    HA (headache)    Hypertension    IBS (irritable bowel syndrome)    Injury of long thoracic nerve    right   Optic nerve edema    Sinus congestion    Upset stomach    Vitamin D  deficiency      Past Surgical History:   Past Surgical History:  Procedure Laterality Date   COLONOSCOPY WITH ESOPHAGOGASTRODUODENOSCOPY (EGD) AND ESOPHAGEAL DILATION (ED)  12/16/2021   [facility]   KNEE ARTHROSCOPY Left 07/30/2020   Dr. Gerome     Past OB/GYN History: OB History  Gravida Para Term Preterm AB Living  2 2 2  0 0 2  SAB IAB Ectopic Multiple Live Births  0 0 0 0 2    # Outcome Date GA Lbr Len/2nd Weight Sex Type Anes PTL Lv  2 Term 06/01/11 [redacted]w[redacted]d 05:32 / 00:13 5 lb 15.8 oz (2.716 kg) F Vag-Spont EPI  LIV     Birth Comments: WNL  1 Term 12/30/07 [redacted]w[redacted]d   M Vag-Spont EPI  LIV    Forceps/ Vacuum deliveries: 0, Denies OASIS. Cesarean  section: 0 Menopausal: No, LMP No LMP recorded. (Menstrual status: IUD). Contraception: IUD. Last pap smear was 10/13/21 NILM neg HPV.  Any history of abnormal pap smears: no. No results found for: DIAGPAP, HPVHIGH, ADEQPAP  Medications: Patient has a current medication list which includes the following prescription(s): vitamin d , dicyclomine , fluoxetine , mirena (52 mg), losartan , metoclopramide , multiple vitamins-minerals, pantoprazole , tirzepatide -weight management, UNABLE TO FIND, lisdexamfetamine, lisdexamfetamine, norethindrone , and ondansetron .   Allergies: Patient is allergic to adipex-p  [phentermine ] and grass pollen([id number] vern).   Social History:  Social History   Tobacco Use   Smoking status: Never   Smokeless tobacco: Never  Vaping Use   Vaping status: Never Used  Substance Use Topics   Alcohol use: No    Alcohol/week: 0.0 standard drinks of alcohol   Drug use: No    Relationship status: married Patient lives with her husband and kids.   Patient is employed in [employer]. Regular exercise: Yes: walks 5-7 days/week History of abuse: No  Family History:  Family History  Problem Relation Age of Onset   Arthritis Mother    Cancer - Prostate Father    Gout Father    Bladder Cancer Father    Stomach cancer Neg Hx    Colon cancer Neg Hx    Esophageal cancer Neg Hx    Colon polyps Neg Hx    Renal cancer Neg Hx      Review of Systems: Review of Systems  Constitutional:  Positive for malaise/fatigue. Negative for fever and weight loss.  Respiratory:  Negative for cough, shortness of breath and wheezing.   Cardiovascular:  Negative for chest pain, palpitations and leg swelling.  Gastrointestinal:  Negative for abdominal pain and blood in stool.  Genitourinary:  Negative for dysuria, frequency, hematuria and urgency.       Irregular periods, leakage, bulge  Skin:  Negative for rash.  Neurological:  Positive for dizziness and headaches. Negative for  weakness.  Endo/Heme/Allergies:  Does not bruise/bleed easily.  Psychiatric/Behavioral:  Negative for depression. The patient is not nervous/anxious.      OBJECTIVE Physical Exam: Vitals:   10/16/23 0825  BP: 122/68  Pulse: 69  Weight: 191 lb (86.6 kg)  Height: 5' 6 (1.676 m)   Physical Exam Constitutional:      General: She is not in acute distress.    Appearance: Normal appearance.  Genitourinary:     Bladder and urethral meatus normal.     No lesions in the vagina.     Right Labia: No rash, tenderness, lesions, skin changes or Bartholin's cyst.    Left Labia: No tenderness, lesions, skin changes, Bartholin's cyst or rash.    No vaginal discharge, erythema, tenderness, bleeding, ulceration or granulation tissue.     Anterior, posterior and apical vaginal prolapse present.    No vaginal atrophy present.     Right Adnexa: not tender, not full and no mass present.    Left Adnexa: not tender, not full and no mass present.    No cervical motion tenderness, discharge, friability, lesion, polyp or nabothian cyst.        Uterus is prolapsed.     Uterus is not enlarged, fixed, tender or irregular.     No uterine mass detected.    Urethral meatus caruncle not present.    No urethral prolapse, tenderness, mass, hypermobility, discharge or stress urinary incontinence with cough stress test present.     Bladder is not tender, urgency on palpation not present and masses not present.      Pelvic Floor: Levator muscle strength is 4/5.    Levator ani not tender, obturator internus not tender, no asymmetrical contractions present and no pelvic spasms present.    Anal wink absent and BC reflex absent.     Symmetrical pelvic sensation. Cardiovascular:     Rate and Rhythm: Normal rate.  Pulmonary:     Effort: Pulmonary effort is normal. No respiratory distress.  Abdominal:     General: There is no distension.     Palpations: Abdomen is soft. There is no mass.     Tenderness: There is  no abdominal tenderness.     Hernia: No hernia is present.  Neurological:     Mental Status: She is alert.  Vitals reviewed. Exam conducted with a chaperone present.      POP-Q:   POP-Q  3  Aa   3                                           Ba  4                                              C   6                                            Gh  4                                            Pb  9                                            tvl   0                                            Ap  0                                            Bp  0                                              D     Post-Void Residual (PVR) by Bladder Scan: In order to evaluate bladder emptying, we discussed obtaining a postvoid residual and patient agreed to this procedure.  Procedure: The ultrasound unit was placed on the patient's abdomen in the suprapubic region after the patient had voided.    Post Void Residual - 10/16/23 0834       Post Void Residual   Post Void Residual 11 mL           Laboratory Results: Lab Results  Component Value Date   COLORU yellow 10/16/2023   CLARITYU clear 10/16/2023   GLUCOSEUR negative 10/16/2023   BILIRUBINUR negative 10/16/2023   SPECGRAV 1.020 10/16/2023   RBCUR negative 10/16/2023   PHUR 6.0 10/16/2023   PROTEINUR NEGATIVE 06/15/2015   UROBILINOGEN 0.2 10/16/2023   LEUKOCYTESUR Negative 10/16/2023    Lab Results  Component Value Date   CREATININE 0.78 09/15/2021   CREATININE 0.70 10/17/2019   CREATININE 0.74 06/15/2015    Lab Results  Component Value Date   HGBA1C 4.9 10/17/2019    Lab Results  Component Value Date   HGB 14.5 09/15/2021     ASSESSMENT AND PLAN Tammy Olsen is a 48 y.o. with:  1. Chronic idiopathic constipation   2. SUI (stress urinary incontinence, female)   3. Obesity (BMI  30-39.9)   4. Incomplete uterine prolapse   5. IUD contraception     Chronic  idiopathic constipation Assessment & Plan: - Bristol stool IV daily without straining on benefiber 1 teaspoon/day with IBS - For constipation, we reviewed the importance of a better bowel regimen.  We also discussed the importance of avoiding chronic straining, as it can exacerbate her pelvic floor symptoms; we discussed treating constipation and straining prior to surgery, as postoperative straining can lead to damage to the repair and recurrence of symptoms.  - We discussed continuing therapy with increasing fluid intake, fiber supplementation and titration based on stool consistency - discussed association with pelvic floor disorders    SUI (stress urinary incontinence, female) Assessment & Plan: - POCT UA negative, PVR 11mL - For treatment of stress urinary incontinence,  non-surgical options include expectant management, weight loss, physical therapy, as well as a pessary.  Surgical options include a midurethral sling, Burch urethropexy, and transurethral injection of a bulking agent. - encouraged fluid management, Kegel exercises, and continue fiber supplementation for optimization of bowel movements - we discussed an office procedure with urethral bulking (Bulkamid). We discussed success rate of approximately 70-80% and possible need for second injection. We reviewed that this is not a permanent procedure and the Bulkamid does become less effective over time. Risks reviewed including injury to bladder or urethra, UTI, urinary retention and hematuria.  - Sling: The effectiveness of a midurethral vaginal mesh sling is approximately 85%, and thus, there will be times when you may leak urine after surgery, especially if your bladder is full or if you have a strong cough. There is a balance between making the sling tight enough to treat your leakage but not too tight so that you have long-term difficulty emptying your bladder. A mesh sling will not directly treat overactive bladder/urge  incontinence and may worsen it.  There is an FDA safety notification on vaginal mesh procedures for prolapse but NOT mesh slings. We have extensive experience and training with mesh placement and we have close postoperative follow up to identify any potential complications from mesh. It is important to realize that this mesh is a permanent implant that cannot be easily removed. There are rare risks of mesh exposure (2-4%), pain with intercourse (0-7%), and infection (<1%). The risk of mesh exposure if more likely in a woman with risks for poor healing (prior radiation, poorly controlled diabetes, or immunocompromised). The risk of new or worsened chronic pain after mesh implant is more common in women with baseline chronic pain and/or poorly controlled anxiety or depression. Approximately 2-4% of patients will experience longer-term post-operative voiding dysfunction that may require surgical revision of the sling. We also reviewed that postoperatively, her stream may not be as strong as before surgery.  - encouraged pt to return with full bladder for repeat CST  Orders: -     POCT URINALYSIS DIP (CLINITEK)  Obesity (BMI 30-39.9) Assessment & Plan: - discussed association with pelvic floor disorders - continue active lifestyle, exercise and weight reduction    Incomplete uterine prolapse Assessment & Plan: - underwent pelvic floor PT without relief - For treatment of pelvic organ prolapse, we discussed options for management including expectant management, conservative management, and surgical management, such as Kegels, a pessary, pelvic floor physical therapy, and specific surgical procedures. - reviewed risks and benefits of uterine preservation - We discussed two options for prolapse repair:  1) vaginal repair without mesh - Pros - safer, no mesh complications - Cons -  not as strong as mesh repair, higher risk of recurrence  2) laparoscopic repair with mesh - Pros - stronger, better  long-term success - Cons - risks of mesh implant (erosion into vagina or bladder, adhering to the rectum, pain) - these risks are lower than with a vaginal mesh but still exist - pt consider sacrocolpopexy due to active lifestyle, exercise and stage III pelvic organ prolapse - encouraged pt to consider options and return for bladder testing  Orders: -     US  PELVIS TRANSVAGINAL NON-OB (TV ONLY); Future  IUD contraception Assessment & Plan: - no IUD strings noted on exam - irregular spotting for 6 months - TVUS ordered to evaluate for IUD placement and endometrial lining  Orders: -     US  PELVIS TRANSVAGINAL NON-OB (TV ONLY); Future  Time spent: I spent 65 minutes dedicated to the care of this patient on the date of this encounter to include pre-visit review of records, face-to-face time with the patient discussing stage III pelvic organ prolapse, IUD, constipation, stress urinary incontinence, BMI, and post visit documentation and ordering testing.   Lianne ONEIDA Gillis, MD

## 2023-10-16 NOTE — Assessment & Plan Note (Signed)
-   underwent pelvic floor PT without relief - For treatment of pelvic organ prolapse, we discussed options for management including expectant management, conservative management, and surgical management, such as Kegels, a pessary, pelvic floor physical therapy, and specific surgical procedures. - reviewed risks and benefits of uterine preservation - We discussed two options for prolapse repair:  1) vaginal repair without mesh - Pros - safer, no mesh complications - Cons - not as strong as mesh repair, higher risk of recurrence  2) laparoscopic repair with mesh - Pros - stronger, better long-term success - Cons - risks of mesh implant (erosion into vagina or bladder, adhering to the rectum, pain) - these risks are lower than with a vaginal mesh but still exist - pt consider sacrocolpopexy due to active lifestyle, exercise and stage III pelvic organ prolapse - encouraged pt to consider options and return for bladder testing

## 2023-10-20 ENCOUNTER — Ambulatory Visit (HOSPITAL_COMMUNITY)
Admission: RE | Admit: 2023-10-20 | Discharge: 2023-10-20 | Disposition: A | Discharge status: Home / Self Care | Source: Ambulatory Visit | Attending: Obstetrics | Admitting: Obstetrics

## 2023-10-20 DIAGNOSIS — Z975 Presence of (intrauterine) contraceptive device: Secondary | ICD-10-CM | POA: Diagnosis present

## 2023-10-20 DIAGNOSIS — N812 Incomplete uterovaginal prolapse: Secondary | ICD-10-CM | POA: Diagnosis present

## 2023-10-23 ENCOUNTER — Ambulatory Visit (INDEPENDENT_AMBULATORY_CARE_PROVIDER_SITE_OTHER): Admitting: Obstetrics and Gynecology

## 2023-10-23 ENCOUNTER — Encounter: Payer: Self-pay | Admitting: Obstetrics and Gynecology

## 2023-10-23 VITALS — BP 114/79 | HR 65

## 2023-10-23 DIAGNOSIS — N812 Incomplete uterovaginal prolapse: Secondary | ICD-10-CM

## 2023-10-23 DIAGNOSIS — N393 Stress incontinence (female) (male): Secondary | ICD-10-CM | POA: Diagnosis not present

## 2023-10-23 NOTE — Progress Notes (Signed)
 [facility] Return Visit  SUBJECTIVE  History of Present Illness: Tammy Olsen is a 48 y.o. female seen in follow-up for discussion of surgical information and Simple CMG to assess for leakage. Plan at last visit was consider surgical options.   Patient has considered her surgical options and has decided on the stronger prolapse repair with mesh. She is accompanied by her husband today.    Past Medical History: Patient  has a past medical history of Allergy, Anxiety, Arthritis, Complication of anesthesia, GERD (gastroesophageal reflux disease), HA (headache), Hypertension, IBS (irritable bowel syndrome), Injury of long thoracic nerve, Optic nerve edema, Sinus congestion, Upset stomach, and Vitamin D  deficiency.   Past Surgical History: She  has a past surgical history that includes Knee arthroscopy (Left, 07/30/2020) and Colonoscopy with esophagogastroduodenoscopy (egd) and esophageal dilation (ed) (12/16/2021).   Medications: She has a current medication list which includes the following prescription(s): vitamin d , fluoxetine , mirena (52 mg), losartan , multiple vitamins-minerals, pantoprazole , tirzepatide -weight management, and UNABLE TO FIND.   Allergies: Patient is allergic to adipex-p  [phentermine ] and grass pollen([id number] vern).   Social History: Patient  reports that she has never smoked. She has never used smokeless tobacco. She reports that she does not drink alcohol and does not use drugs.     OBJECTIVE   Verbal consent was obtained to perform simple CMG procedure:   Prolapse was reduced using 2 large cotton swabs. Urethra was prepped with betadine and a 77F catheter was placed and bladder was drained completely. The bladder was then backfilled with sterile water by gravity.  First sensation: 130 First Desire: 180 Strong Desire: 250 Capacity: 400 Cough stress test was negative. Valsalva stress test was negative. Both valsalva and cough stress tests  were also negative with standing. She was was allowed to void on her own.   Interpretation: CMG showed within normal limits sensation, and normal cystometric capacity. Findings negative for stress incontinence, negative for detrusor overactivity.      Physical Exam: Vitals:   10/23/23 0857  BP: 114/79  Pulse: 65   Gen: No apparent distress, A&O x 3.  Detailed Urogynecologic Evaluation:  Normal external exam. Urethral normal in appearance. No obvious bleeding or irritation noted.    ASSESSMENT AND PLAN    Tammy Olsen is a 48 y.o. with:  1. Incomplete uterine prolapse   2. SUI (stress urinary incontinence, female)    Patient, her husband, and I had a lengthy conversation regarding the surgery she was choosing. We discussed the risks of mesh erosion (2-4%), new onset painful intercourse(5-8%), pelvic floor pain, and infection (Less than 1%). Patient was allowed to feel and handle the mesh for sacrocolpopexy. Using the POP-Q tool and the IUGA book we discussed the specifics of the surgical procedure. We also discussed that with her GH being 6 she would likely need a perineorrhaphy. We discussed that at her age she would retain her ovaries unless there were concerns on her ultrasound. Patient's simple CMG was negative today with both sitting and standing for both cough and valsalva, this does not rule out occult incontinence. She will further discuss with Dr. Guadlupe.  Patient would like to move forward with surgical request.  Patient to return for pre-op within 30 days of surgery  Tammy Nichols G Shayonna Ocampo, NP

## 2023-11-06 ENCOUNTER — Ambulatory Visit: Payer: Self-pay | Admitting: Obstetrics

## 2023-11-21 ENCOUNTER — Telehealth: Payer: Self-pay | Admitting: Family

## 2023-11-21 ENCOUNTER — Telehealth (INDEPENDENT_AMBULATORY_CARE_PROVIDER_SITE_OTHER): Admitting: Obstetrics

## 2023-11-21 ENCOUNTER — Encounter: Payer: Self-pay | Admitting: Obstetrics

## 2023-11-21 ENCOUNTER — Encounter: Payer: Self-pay | Admitting: *Deleted

## 2023-11-21 DIAGNOSIS — E669 Obesity, unspecified: Secondary | ICD-10-CM | POA: Diagnosis not present

## 2023-11-21 DIAGNOSIS — N393 Stress incontinence (female) (male): Secondary | ICD-10-CM

## 2023-11-21 DIAGNOSIS — K5904 Chronic idiopathic constipation: Secondary | ICD-10-CM | POA: Diagnosis not present

## 2023-11-21 DIAGNOSIS — Z975 Presence of (intrauterine) contraceptive device: Secondary | ICD-10-CM

## 2023-11-21 DIAGNOSIS — N812 Incomplete uterovaginal prolapse: Secondary | ICD-10-CM | POA: Diagnosis not present

## 2023-11-21 DIAGNOSIS — M5442 Lumbago with sciatica, left side: Secondary | ICD-10-CM

## 2023-11-21 NOTE — Progress Notes (Signed)
 Tammy Olsen Return Visit  Patient location: at work  Provider location: [facility]  Patient consents to video call and understands the limitations of telehealth visit.  SUBJECTIVE  History of Present Illness: Tammy Olsen is a 48 y.o. female seen in follow-up for stage III pelvic organ prolapse, IUD, constipation, stress urinary incontinence, and BMI. Plan at last visit was review surgical options.   Denies improvement in vaginal bulge symptoms.  Continues to experience irregular spotting Benefiber 1 teaspoon/day fiber and miralax every 2 days, takes prebiotic and probiotic  Drinks: 60-80oz down from 80-120oz water per day within 2-3 years, 8oz of coffee, 8oz decaf tea. Denies reduction of SUI. SUI 2x/week.  Reports complications from epidural anesthesia during pregnancy 16 years ago, reports drop in blood pressure and wanting to fall asleep. Reports left sided SI joint pain similar to pregnancy, sleeps on her right side  Simple CMG 10/23/23 Interpretation: CMG showed within normal limits sensation, and normal cystometric capacity . Findings negative for stress incontinence, negative for detrusor overactivity.  CLINICAL DATA:  Irregular cycles and nonvisualization of IUD string   EXAM: ULTRASOUND PELVIS TRANSVAGINAL   TECHNIQUE: Transvaginal ultrasound examination of the pelvis was performed including evaluation of the uterus, ovaries, adnexal regions, and pelvic cul-de-sac.   COMPARISON:  CT from 09/23/2021   FINDINGS: Uterus   Measurements: 9.5 x 4.3 x 6.4 cm. = volume: 138 mL. IUD is noted in place.   Endometrium   Thickness: 2.8 mm.  No focal abnormality visualized.   Right ovary   Measurements: 2.0 x 1.4 x 1.4 cm. = volume: 2 mL. Normal appearance/no adnexal mass.   Left ovary   Measurements: 3.7 x 2.4 x 2.4 cm = volume: 7.5 mL. A prominent simple appearing cyst measuring 3.4 cm is noted.   Other  findings:  No abnormal free fluid   IMPRESSION: IUD in place.   Left ovarian benign functional cyst measuring 3.4 cm, if patient is premenopausal (probable benign cyst). If patient is premenopausal, no follow-up recommended. If patient is postmenopausal, recommend follow-up pelvic ultrasound in 3-6 months. Reference: Radiology 2019 Nov;293(2):359-371     Electronically Signed   By: Oneil Devonshire M.D.   On: 11/03/2023 22:25  Past Medical History: Patient  has a past medical history of Allergy, Anxiety, Arthritis, Complication of anesthesia, GERD (gastroesophageal reflux disease), HA (headache), Hypertension, IBS (irritable bowel syndrome), Injury of long thoracic nerve, Optic nerve edema, Sinus congestion, Upset stomach, and Vitamin D  deficiency.   Past Surgical History: She  has a past surgical history that includes Knee arthroscopy (Left, 07/30/2020) and Colonoscopy with esophagogastroduodenoscopy (egd) and esophageal dilation (ed) (12/16/2021).   Medications: She has a current medication list which includes the following prescription(s): vitamin d , fluoxetine , mirena (52 mg), losartan , multiple vitamins-minerals, pantoprazole , tirzepatide -weight management, and UNABLE TO FIND.   Allergies: Patient is allergic to adipex-p  [phentermine ] and grass pollen(k-o-r-t-swt vern).   Social History: Patient  reports that she has never smoked. She has never used smokeless tobacco. She reports that she does not drink alcohol and does not use drugs.     OBJECTIVE     Physical Exam: Gen: No apparent distress, A&O x 3.  Detailed Urogynecologic Evaluation:  Deferred. Prior exam showed: POP-Q 10/16/23   3  Aa   3                                           Ba   4                                              C    6                                            Gh   4                                            Pb   9                                             tvl    0                                            Ap   0                                            Bp   0                                              D          ASSESSMENT AND PLAN    [name] is a 48 y.o. with:  1. Incomplete uterine prolapse   2. SUI (stress urinary incontinence, female)   3. IUD contraception   4. Chronic idiopathic constipation   5. Obesity (BMI 30-39.9)   6. Left-sided low back pain with sciatica, sciatica laterality unspecified, unspecified chronicity     Incomplete uterine prolapse Assessment & Plan: - underwent pelvic floor PT without relief - For treatment of pelvic organ prolapse, we discussed options for management including expectant management, conservative management, and surgical management, such as Kegels, a pessary, pelvic floor physical therapy, and specific surgical procedures. - reviewed risks and benefits of uterine preservation - We discussed two options for prolapse repair:  1) vaginal repair without mesh - Pros - safer, no mesh complications - Cons - not as strong as mesh repair, higher risk of recurrence  2) laparoscopic repair with mesh - Pros - stronger, better long-term success - Cons - risks of mesh implant (erosion into vagina or bladder, adhering to the rectum, pain) - these risks are lower than with a vaginal mesh but still exist - pt desires to proceed with sacrocolpopexy due to active lifestyle, exercise and stage III pelvic  organ prolapse, prefers November to early December  - discussed possible conversion to native tissue repair pending intraoperative findings   SUI (stress urinary incontinence, female) Assessment & Plan: - 10/16/23 POCT UA negative, PVR 11mL - SUI 2x/week per history, negative CST with simple CMG - For treatment of stress urinary incontinence,  non-surgical options include expectant management, weight loss, physical therapy, as well as a pessary.  Surgical options include a midurethral  sling, Burch urethropexy, and transurethral injection of a bulking agent. - encouraged fluid management, Kegel exercises, and continue fiber supplementation for optimization of bowel movements - we discussed an office procedure with urethral bulking (Bulkamid). We discussed success rate of approximately 70-80% and possible need for second injection. We reviewed that this is not a permanent procedure and the Bulkamid does become less effective over time. Risks reviewed including injury to bladder or urethra, UTI, urinary retention and hematuria.  - Sling: The effectiveness of a midurethral vaginal mesh sling is approximately 85%, and thus, there will be times when you may leak urine after surgery, especially if your bladder is full or if you have a strong cough. There is a balance between making the sling tight enough to treat your leakage but not too tight so that you have long-term difficulty emptying your bladder. A mesh sling will not directly treat overactive bladder/urge incontinence and may worsen it.  There is an FDA safety notification on vaginal mesh procedures for prolapse but NOT mesh slings. We have extensive experience and training with mesh placement and we have close postoperative follow up to identify any potential complications from mesh. It is important to realize that this mesh is a permanent implant that cannot be easily removed. There are rare risks of mesh exposure (2-4%), pain with intercourse (0-7%), and infection (<1%). The risk of mesh exposure if more likely in a woman with risks for poor healing (prior radiation, poorly controlled diabetes, or immunocompromised). The risk of new or worsened chronic pain after mesh implant is more common in women with baseline chronic pain and/or poorly controlled anxiety or depression. Approximately 2-4% of patients will experience longer-term post-operative voiding dysfunction that may require surgical revision of the sling. We also reviewed that  postoperatively, her stream may not be as strong as before surgery.  - pt desires to proceed with midurethral sling due to history of SUI and risk of worsening SUI postop  - encouraged pt to return with full bladder for repeat CST at preop visit and discussed possible need for urodynamic testing if required by insurance.   IUD contraception Assessment & Plan: - no IUD strings noted on exam - irregular spotting for 6 months - TVUS 11/03/23 with IUD noted and 3.4cm simple appearing follicle - will remove at the time of hysterectomy   Chronic idiopathic constipation Assessment & Plan: - Bristol stool IV daily without straining on benefiber 1 teaspoon/day with IBS - on Zepbound  - For constipation, reviewed the importance of avoiding chronic straining, as it can exacerbate her pelvic floor symptoms; we discussed treating constipation and straining prior to surgery, as postoperative straining can lead to damage to the repair and recurrence of symptoms.  - continue fiber supplementation and titration based on stool consistency and discussed possible need to increase dose postop - discussed association with pelvic floor disorders and risk of recurrence    Obesity (BMI 30-39.9) Assessment & Plan: - discussed association with pelvic floor disorders - continue active lifestyle, exercise and weight reduction on Zepbound  - advised pt to  hold GLP-1 1 week prior to procedure and resume postop after return of GI function   Left-sided low back pain with sciatica, sciatica laterality unspecified, unspecified chronicity Assessment & Plan: - reports left sided lower back pain, similar to symptoms during pregnancy - encouraged pelvic binder use, NSAIDs, and warm compresses as needed for comfort - discussed sleeping with pillow between her legs when she is on her side - discussed need to monitor for midline back pain or fever postop due to sacrocolpopexy   Plan for surgery: Exam under anesthesia,  robotic assisted laparoscopic hysterectomy, bilateral salpingectomy, sacrocolpopexy, possible anterior/posterior repair, perineorrhaphy, midurethral sling, cystourethroscopy   - We reviewed the patient's specific anatomic and functional findings, with the assistance of diagrams, and together finalized the above procedure. The planned surgical procedures were discussed along with the surgical risks outlined below, which were also provided on a detailed handout. Additional treatment options including expectant management, conservative management, medical management were discussed where appropriate.  We reviewed the benefits and risks of each treatment option.   General Surgical Risks: For all procedures, there are risks of bleeding, infection, damage to surrounding organs including but not limited to bowel, bladder, blood vessels, ureters and nerves, and need for further surgery if an injury were to occur. These risks are all low with minimally invasive surgery.   There are risks of numbness and weakness at any body site or buttock/rectal pain.  It is possible that baseline pain can be worsened by surgery, either with or without mesh. If surgery is vaginal, there is also a low risk of possible conversion to laparoscopy or open abdominal incision where indicated. Very rare risks include blood transfusion, blood clot, heart attack, pneumonia, or death.   There is also a risk of short-term postoperative urinary retention with need to use a catheter. About half of patients need to go home from surgery with a catheter, which is then later removed in the office. The risk of long-term need for a catheter is very low. There is also a risk of worsening of overactive bladder.   Prolapse (with or without mesh): Risk factors for surgical failure  include things that put pressure on your pelvis and the surgical repair, including obesity, chronic cough, and heavy lifting or straining (including lifting children or  adults, straining on the toilet, or lifting heavy objects such as furniture or anything weighing >25 lbs. Risks of recurrence is 20-30% with vaginal native tissue repair and a less than 10% with sacrocolpopexy with mesh.    Sacrocolpopexy: Mesh implants may provide more prolapse support, but do have some unique risks to consider. It is important to understand that mesh is permanent and cannot be easily removed. Risks of abdominal sacrocolpopexy mesh include mesh exposure (~3-6%), painful intercourse (recent studies show lower rates after surgery compared to before, with ~5-8% risk of new onset), and very rare risks of bowel or bladder injury or infection (<1%). The risk of mesh exposure is more likely in a woman with risks for poor healing (prior radiation, poorly controlled diabetes, or immunocompromised). The risk of new or worsened chronic pain after mesh implant is more common in women with baseline chronic pain and/or poorly controlled anxiety or depression. There is an FDA safety notification on vaginal mesh procedures for prolapse but NOT abdominal mesh procedures and therefore does not apply to your surgery. We have extensive experience and training with mesh placement and we have close postoperative follow up to identify any potential complications from mesh.    -  For preop Visit:  She is required to have a visit within 30 days of her surgery.   Today we reviewed pre-operative preparation, peri-operative expectations, and post-operative instructions/recovery.  She was provided with instructional handouts. She understands not to take aspirin (>81mg ) or NSAIDs 7 days prior to surgery. Prescriptions will be provided for: Oxycodone  5mg , Ibuprofen  600mg , Tylenol  500mg , Miralax. These prescriptions will be sent prior to surgery.  - Medical clearance: required Letter sent to PCP requesting risk stratification and medical optimization. - Anticoagulant use: Yes, Caprini score 4 - Medicaid Hysterectomy  form: No - Accepts blood transfusion: Yes - Expected length of stay: outpatient  Request sent for surgery scheduling.   Time spent: I spent 27 minutes dedicated to the care of this patient on the date of this encounter to include pre-visit review of records, face-to-face time with the patient discussing stage III pelvic organ prolapse, IUD, constipation, stress urinary incontinence, left sided back pain, and BMI and post visit documentation and ordering medication/ testing.   Lianne ONEIDA Gillis, MD

## 2023-11-21 NOTE — Telephone Encounter (Signed)
 Dr. Guadlupe, RICK.  Tks

## 2023-11-21 NOTE — Assessment & Plan Note (Signed)
-   10/16/23 POCT UA negative, PVR 11mL - SUI 2x/week per history, negative CST with simple CMG - For treatment of stress urinary incontinence,  non-surgical options include expectant management, weight loss, physical therapy, as well as a pessary.  Surgical options include a midurethral sling, Burch urethropexy, and transurethral injection of a bulking agent. - encouraged fluid management, Kegel exercises, and continue fiber supplementation for optimization of bowel movements - we discussed an office procedure with urethral bulking (Bulkamid). We discussed success rate of approximately 70-80% and possible need for second injection. We reviewed that this is not a permanent procedure and the Bulkamid does become less effective over time. Risks reviewed including injury to bladder or urethra, UTI, urinary retention and hematuria.  - Sling: The effectiveness of a midurethral vaginal mesh sling is approximately 85%, and thus, there will be times when you may leak urine after surgery, especially if your bladder is full or if you have a strong cough. There is a balance between making the sling tight enough to treat your leakage but not too tight so that you have long-term difficulty emptying your bladder. A mesh sling will not directly treat overactive bladder/urge incontinence and may worsen it.  There is an FDA safety notification on vaginal mesh procedures for prolapse but NOT mesh slings. We have extensive experience and training with mesh placement and we have close postoperative follow up to identify any potential complications from mesh. It is important to realize that this mesh is a permanent implant that cannot be easily removed. There are rare risks of mesh exposure (2-4%), pain with intercourse (0-7%), and infection (<1%). The risk of mesh exposure if more likely in a woman with risks for poor healing (prior radiation, poorly controlled diabetes, or immunocompromised). The risk of new or worsened chronic pain  after mesh implant is more common in women with baseline chronic pain and/or poorly controlled anxiety or depression. Approximately 2-4% of patients will experience longer-term post-operative voiding dysfunction that may require surgical revision of the sling. We also reviewed that postoperatively, her stream may not be as strong as before surgery.  - pt desires to proceed with midurethral sling due to history of SUI and risk of worsening SUI postop  - encouraged pt to return with full bladder for repeat CST at preop visit and discussed possible need for urodynamic testing if required by insurance.

## 2023-11-21 NOTE — Assessment & Plan Note (Signed)
-   reports left sided lower back pain, similar to symptoms during pregnancy - encouraged pelvic binder use, NSAIDs, and warm compresses as needed for comfort - discussed sleeping with pillow between her legs when she is on her side - discussed need to monitor for midline back pain or fever postop due to sacrocolpopexy

## 2023-11-21 NOTE — Assessment & Plan Note (Addendum)
-   discussed association with pelvic floor disorders - continue active lifestyle, exercise and weight reduction on Zepbound  - advised pt to hold GLP-1 1 week prior to procedure and resume postop after return of GI function

## 2023-11-21 NOTE — Assessment & Plan Note (Addendum)
-   underwent pelvic floor PT without relief - For treatment of pelvic organ prolapse, we discussed options for management including expectant management, conservative management, and surgical management, such as Kegels, a pessary, pelvic floor physical therapy, and specific surgical procedures. - reviewed risks and benefits of uterine preservation - We discussed two options for prolapse repair:  1) vaginal repair without mesh - Pros - safer, no mesh complications - Cons - not as strong as mesh repair, higher risk of recurrence  2) laparoscopic repair with mesh - Pros - stronger, better long-term success - Cons - risks of mesh implant (erosion into vagina or bladder, adhering to the rectum, pain) - these risks are lower than with a vaginal mesh but still exist - pt desires to proceed with sacrocolpopexy due to active lifestyle, exercise and stage III pelvic organ prolapse, prefers November to early December  - discussed possible conversion to native tissue repair pending intraoperative findings

## 2023-11-21 NOTE — Telephone Encounter (Signed)
 Spoke with pt , stated Tammy Olsen is no longer her PCP , Tammy Olsen [place], FNP removed as pt's PCP

## 2023-11-21 NOTE — Telephone Encounter (Signed)
 Opened in error

## 2023-11-21 NOTE — Assessment & Plan Note (Signed)
-   no IUD strings noted on exam - irregular spotting for 6 months - TVUS 11/03/23 with IUD noted and 3.4cm simple appearing follicle - will remove at the time of hysterectomy

## 2023-11-21 NOTE — Assessment & Plan Note (Signed)
-   Bristol stool IV daily without straining on benefiber 1 teaspoon/day with IBS - on Zepbound  - For constipation, reviewed the importance of avoiding chronic straining, as it can exacerbate her pelvic floor symptoms; we discussed treating constipation and straining prior to surgery, as postoperative straining can lead to damage to the repair and recurrence of symptoms.  - continue fiber supplementation and titration based on stool consistency and discussed possible need to increase dose postop - discussed association with pelvic floor disorders and risk of recurrence

## 2023-11-21 NOTE — Telephone Encounter (Signed)
 Pt's surgeon reached out to me. She is requesting surgical clearance for her upcoming Urogyn procedure.  I have not seen her since 2023. Please call pt to set up a clearance appointment.

## 2024-01-10 DIAGNOSIS — M25561 Pain in right knee: Secondary | ICD-10-CM | POA: Insufficient documentation

## 2024-02-06 ENCOUNTER — Encounter: Admitting: Family Medicine

## 2024-02-07 ENCOUNTER — Encounter: Payer: Self-pay | Admitting: Family Medicine

## 2024-02-07 ENCOUNTER — Ambulatory Visit (INDEPENDENT_AMBULATORY_CARE_PROVIDER_SITE_OTHER): Admitting: Family Medicine

## 2024-02-07 VITALS — BP 114/68 | HR 111 | Ht 66.5 in | Wt 177.4 lb

## 2024-02-07 DIAGNOSIS — E663 Overweight: Secondary | ICD-10-CM | POA: Diagnosis not present

## 2024-02-07 DIAGNOSIS — Z8639 Personal history of other endocrine, nutritional and metabolic disease: Secondary | ICD-10-CM

## 2024-02-07 DIAGNOSIS — M25561 Pain in right knee: Secondary | ICD-10-CM

## 2024-02-07 DIAGNOSIS — N814 Uterovaginal prolapse, unspecified: Secondary | ICD-10-CM

## 2024-02-07 DIAGNOSIS — Z6827 Body mass index (BMI) 27.0-27.9, adult: Secondary | ICD-10-CM | POA: Diagnosis not present

## 2024-02-07 DIAGNOSIS — I10 Essential (primary) hypertension: Secondary | ICD-10-CM

## 2024-02-07 DIAGNOSIS — G8929 Other chronic pain: Secondary | ICD-10-CM | POA: Diagnosis not present

## 2024-02-07 DIAGNOSIS — F411 Generalized anxiety disorder: Secondary | ICD-10-CM | POA: Diagnosis not present

## 2024-02-07 DIAGNOSIS — K219 Gastro-esophageal reflux disease without esophagitis: Secondary | ICD-10-CM

## 2024-02-07 DIAGNOSIS — M25562 Pain in left knee: Secondary | ICD-10-CM | POA: Diagnosis not present

## 2024-02-07 MED ORDER — TIRZEPATIDE-WEIGHT MANAGEMENT 15 MG/0.5ML ~~LOC~~ SOLN: 15.0000 mg | SUBCUTANEOUS | 2 refills | Status: DC

## 2024-02-07 MED ORDER — PANTOPRAZOLE SODIUM 40 MG PO TBEC: 40.0000 mg | DELAYED_RELEASE_TABLET | Freq: Every day | ORAL | 3 refills | Status: AC

## 2024-02-07 MED ORDER — LOSARTAN POTASSIUM 25 MG PO TABS: 25.0000 mg | ORAL_TABLET | Freq: Every day | ORAL | 1 refills | Status: AC

## 2024-02-07 MED ORDER — FLUOXETINE HCL 40 MG PO CAPS: 40.0000 mg | ORAL_CAPSULE | Freq: Every day | ORAL | 3 refills | Status: AC

## 2024-02-07 NOTE — Progress Notes (Unsigned)
 Assessment and Plan 1. Essential hypertension   2. GAD (generalized anxiety disorder)   3. Gastroesophageal reflux disease without esophagitis   4. Uterine prolapse   5. Chronic pain of both knees   6. Hx of obesity   7. Overweight with body mass index (BMI) of 27 to 27.9 in adult    Assessment & Plan Knee osteoarthritis with chronic pain and recurrent effusion Chronic knee pain with recurrent effusion, exacerbated by weather. Previous gel injections provided temporary relief. Recent aspiration worsened symptoms. No cartilage regeneration possible, PRP not recommended. Meloxicam not tolerated. - Continue glucosamine and Tylenol  Arthritis twice daily. - Avoid meloxicam. - Consider referral to orthopedic surgeon.  Symptomatic uterine prolapse with planned hysterectomy and pelvic organ repair Symptomatic uterine prolapse with visible protrusion. Scheduled for hysterectomy and pelvic organ repair with mesh sling. Informed about potential post-operative discomfort and bowel management. - Stop GLP-1 agonist 14 days before surgery. - Ensure regular bowel movements before surgery using Miralax. - Post-operative care includes monitoring discomfort and bowel movements.  Overweight, BMI 28, on GLP-1 agonist therapy BMI 27-28 with ongoing weight management. GLP-1 agonist adjusted to 5 units weekly due to gastrointestinal side effects. Weight loss achieved, appetite control challenging. - Continue GLP-1 agonist at 5 units weekly. - Monitor weight and muscle mass with new scale.  Gastroesophageal reflux disease (GERD) GERD managed with pantoprazole . - Continue pantoprazole  as prescribed.  Essential hypertension Blood pressure well-controlled on losartan  25 mg daily. - Continue losartan  25 mg daily.  Constipation, stable on daily Miralax Constipation managed with daily Miralax. - Continue daily Miralax.  Geni Shutter, DO, MS, FAAFP, Dipl. [facility] [address] [place], [address] Dept: [phone] Dept Fax: [phone]  Subjective:   Patient is well known to me from my previous clinic and is now establishing care with me as their primary care provider.  Discussed the use of AI scribe software for clinical note transcription with the patient, who gave verbal consent to proceed. History of Present Illness Tammy Olsen is a 48 year old female who presents for follow-up and pre-surgical evaluation.  Knee pain and functional limitation - Worsening bilateral knee pain, with significant discomfort by the end of the day - No lasting relief from gel injections administered in October - Increased pain following fluid aspiration last Friday - Takes glucosamine and Tylenol  Arthritis for symptom management; avoids anti-inflammatory medications - Exercises regularly but finds knee pain increasingly bothersome - No falls or episodes of knee instability  Uterine prolapse and surgical planning - Severe uterine prolapse, described as visible and constantly protruding - Scheduled for surgical intervention involving mesh sling placement and hysterectomy with ovarian preservation - Expresses nervousness regarding the upcoming surgery, particularly concerning the sling attachment  Weight loss and appetite control - Current weight is 177 pounds, decreased from 191 pounds - Weight loss attributed to medication adjustments due to stomach discomfort - Administers 5 units weekly (medication unspecified) - Occasional difficulty with appetite control despite weight loss  Medication use and adverse effects - Current medications include Prozac , losartan  25 mg, pantoprazole , and daily Miralax - Continues to use Mirena IUD - Occasional dizziness attributed to low blood sugar - No symptoms of low blood pressure  All past medical history, surgical history, allergies, family history, immunizations andmedications were updated in  the EMR today and reviewed under the history and medication portions of their EMR. Review of Systems: Negative, with the exception of above mentioned in HPI.  Current Outpatient Medications:    Cholecalciferol (VITAMIN D ) 50 MCG (2000 UT) CAPS, Take 4,000 Units by mouth. , Disp: , Rfl:    glucosamine-chondroitin 500-400 MG tablet, Take 1 tablet by mouth 2 (two) times daily., Disp: , Rfl:    levonorgestrel (MIRENA, 52 MG,) 20 MCG/24HR IUD, Mirena 20 mcg/24 hours (7 yrs) 52 mg intrauterine device  Take 1 insert every day by intrauterine route., Disp: , Rfl:    Multiple Vitamins-Minerals (WOMENS MULTIVITAMIN PO), Take by mouth., Disp: , Rfl:    PREBIOTIC PRODUCT PO, Take by mouth., Disp: , Rfl:    FLUoxetine  (PROZAC ) 40 MG capsule, Take 1 capsule (40 mg total) by mouth daily., Disp: 90 capsule, Rfl: 3   losartan  (COZAAR ) 25 MG tablet, Take 1 tablet (25 mg total) by mouth daily., Disp: 90 tablet, Rfl: 1   pantoprazole  (PROTONIX ) 40 MG tablet, Take 1 tablet (40 mg total) by mouth daily., Disp: 90 tablet, Rfl: 3   Tirzepatide -Weight Management 15 MG/0.5ML SOLN, Inject 15 mg into the skin once a week. Pt is microdosing at 5 units, Disp: 2 mL, Rfl: 2   Objective:   BP 114/68 (BP Location: Right Arm, Cuff Size: Normal)   Pulse (!) 111   Ht 5' 6.5 (1.689 m)   Wt 177 lb 6.4 oz (80.5 kg)   SpO2 95%   BMI 28.20 kg/m   Wt Readings from Last 3 Encounters:  02/07/24 177 lb 6.4 oz (80.5 kg)  10/16/23 191 lb (86.6 kg)  02/02/22 217 lb 6.4 oz (98.6 kg)   Physical Exam Constitutional:      General: She is not in acute distress.    Appearance: She is well-developed. She is obese.  HENT:     Head: Normocephalic and atraumatic.  Eyes:     Conjunctiva/sclera: Conjunctivae normal.  Cardiovascular:     Rate and Rhythm: Normal rate and regular rhythm.     Heart sounds: Normal heart sounds.  Pulmonary:     Effort: Pulmonary effort is normal.     Breath sounds: Normal breath sounds.  Musculoskeletal:      Cervical back: Normal range of motion and neck supple.  Neurological:     General: No focal deficit present.     Mental Status: She is alert and oriented to person, place, and time.  Psychiatric:        Mood and Affect: Mood normal.        Behavior: Behavior normal.     Lab Results  Component Value Date   CREATININE 0.78 09/15/2021   BUN 12 09/15/2021   NA 138 09/15/2021   K 4.0 09/15/2021   CL 105 09/15/2021   CO2 25 09/15/2021   Lab Results  Component Value Date   ALT 14 09/15/2021   AST 17 09/15/2021   ALKPHOS 72 09/15/2021   BILITOT 0.7 09/15/2021   Lab Results  Component Value Date   HGBA1C 4.9 10/17/2019   HGBA1C 5.1 06/15/2015   HGBA1C 5.0 04/02/2014   Lab Results  Component Value Date   INSULIN  13.8 10/17/2019   Lab Results  Component Value Date   TSH 1.620 10/17/2019   Lab Results  Component Value Date   CHOL 237 (H) 10/17/2019   HDL 52 10/17/2019   LDLCALC 157 (H) 10/17/2019   TRIG 154 (H) 10/17/2019   CHOLHDL 6.3 (H) 06/15/2015   Lab Results  Component Value Date   VD25OH 51.8 10/17/2019   VD25OH 21 (L) 06/15/2015   Lab Results  Component  Value Date   WBC 6.8 09/15/2021   HGB 14.5 09/15/2021   HCT 42.2 09/15/2021   MCV 88.0 09/15/2021   PLT 272.0 09/15/2021   Lab Results  Component Value Date   IRON 81 10/17/2019   TIBC 357 10/17/2019   FERRITIN 101 10/17/2019

## 2024-02-27 ENCOUNTER — Encounter: Payer: Self-pay | Admitting: Obstetrics

## 2024-02-27 DIAGNOSIS — Z8639 Personal history of other endocrine, nutritional and metabolic disease: Secondary | ICD-10-CM

## 2024-02-27 MED ORDER — TIRZEPATIDE-WEIGHT MANAGEMENT 15 MG/0.5ML ~~LOC~~ SOLN: 15.0000 mg | SUBCUTANEOUS | 2 refills | Status: DC

## 2024-02-27 NOTE — Telephone Encounter (Signed)
 CLINICAL USE BELOW THIS LINE (use X to signify taken)  ___x_Form received and placed in providers office for signature.02/27/24 ____Form completed and faxed to LOA Dept. ____Form completed & LVM to notify pt ready for pick up ____Charge sheet & copy of form in front office folder for office supervisor.

## 2024-03-07 ENCOUNTER — Encounter: Payer: Self-pay | Admitting: Obstetrics

## 2024-03-07 ENCOUNTER — Encounter: Payer: Self-pay | Admitting: Family Medicine

## 2024-03-11 ENCOUNTER — Encounter: Admitting: Obstetrics and Gynecology

## 2024-03-18 ENCOUNTER — Encounter (HOSPITAL_COMMUNITY): Payer: Self-pay | Admitting: Obstetrics

## 2024-03-18 NOTE — Progress Notes (Signed)
 Surgical Instructions  Your procedure is scheduled on :  Monday January 12th, 2026 Report to [facility] Main Entrance A at 5:30 AM, then check in the Admitting office. Any questions or running late day of surgery :  call [phone]  Questions prior to your surgery day:  call [phone], Monday -- Friday 8am - 4pm. If you experience any cold or flu symptoms such as cough, fever, chills, shortness of breath, etc. between now and you scheduled surgery, please notify your surgeon office.   Remember: Do Not eat any food after midnight the night before surgery.    This includes No water,  candy,  gum, and mints.  Take these medicines the morning of surgery with A SIPS OF WATER:  Prozac  and Protonix    May take these medicines IF NEEDED:  NONE   One week prior to surgery, STOP taking any Aspirin (unless otherwise instructed by your surgeon) Aleve, Naproxen, ibuprofen , Motrin , Advil , Goody's, BC's, all herbal medications/ supplements, fish oil, and non-prescription vitamins.  Do NOT Smoke (tobacco/ vaping) and Do Not drink alcohol for 24 hours prior to your procedure.  For those patients that use a CPAP.  Please bring your CPAP/ mask/ tubing with them day of surgery . Anesthesia may ask recovery room nurse to use and if you stay the night you be asked to use it.  You will be asked to removed any contacts, glasses, piercing's, hearing aid's, dentures/ partials prior to surgery.  Please bring cases/ container/ solution/ etc., for them day of surgery.   Patients discharged the day of surgery will NOT be allowed to drive home.  You must have responsible driver and caregiver to stay at home with you the next 24 hours.  SURGICAL WAITING ROOM VISITATION Patients may have no more than 2 support people in the waiting area - if more than 2 , these visitors may rotate.  Pre-op nurse will coordinate an appropriate time for 1 Adult support person, who may not rotate, to accompany patient in pre-op.   Aware some patients may have certain circumstances, speak to pre-op nurse day of surgery.  Children under the age 12 must have an adult with them who is not the patient and must remain in the main waiting area with an adult.  If the patient needs to stay at the hospital during part of their recovery, the visitor guidelines for inpatient rooms apply.  Please refer to the [facility] website for the visitor guidelines for any additional information.  If you received a COVID test during your pre-op visit it is requested that you wear a mask when out in public, stay away from anyone that may not be feeling well and notify your surgeon if you develop symptoms.  If you have been in contact with anyone that has tested positive in the past 10 days notify your surgeon.     [place] - Preparing for Surgery  Before surgery, you can play an important role. Because skin is not sterile, it needs to be as free of germs as possible. You can reduce the number of germs on your skin by washing with CHG (chlorhexidine gluconate) soap before surgery. CHG is an antiseptic cleaner which kills germs and bonds with the skin to continue killing germs even after washing. Oral hygiene is also important in reducing the risk of infection. Remember to brush your teeth with your regular toothpaste the morning of surgery.  Please DO NOT use if you have an allergy to CHG or antibacterial  soaps. If your skin becomes reddened/irritated stop using the CHG and inform your Pre-op nurse day of surgery.  DO NOT shave (including legs and genital area) for at least 48 hours prior to your CHG shower.   Please follow these instructions carefully:  Shower with CHG soap the night before surgery. If you choose to wash your hair, wash your hair first as usual with your normal shampoo. After you shampoo, rinse your hair and body thoroughly to remove the shampoo. Use CHG as you would any other liquid soap. You can apply CHG directly to the  skin and wash gently with a clean washcloth or shower sponge. Apply the CHG soap to your body ONLY FROM THE NECK DOWN. Do not use on open wounds or open sores. Avoid contact with your eyes, ears, mouth, and genitals (private parts). Wash genitals (private parts) with your normal soap. Wash thoroughly, paying special attention to the area where your surgery will be performed. Thoroughly rinse your body with warm water from the neck down. DO NOT shower/wash with your normal soap after using and rinsing off the CHG soap. DO NOT use lotions, oils, etc., after showering with CHG. Pat yourself dry with a clean towel. Wear clean pajamas. Place clean sheets on your bed the night of your CHG shower and do not sleep with pets.  Day of Surgery  DO NOT Apply any lotions,  powder,  oils,  deodorants (may use underarm deodorant),  cologne/  perfumes  or makeup Do Not wear jewelry /  piercing's/  metal/  permanent jewelry must be removed prior to arrival day of surgery. (No plastic piercing) Do Not wear nail polish,  gel polish,  artificial nails, or any other type of covering on natural finger nails (toe nails are okay) Remember to brush your teeth and rinse mouth out. Put on clean / comfortable clothes. [place] is not responsible for valuables/ personal belongings

## 2024-03-18 NOTE — Progress Notes (Signed)
 Spoke w/ via phone for pre-op interview--- Leita Malm needs dos---- UPT day of surgery. Pre op lab appt. 03/21/24 at 9A BMP, EKG, CBC and T&S.         Lab results------ COVID test -----patient states asymptomatic no test needed Arrive at -------0530 NPO after MN NO Solid Food.   Pre-Surgery Ensure or G2:  Med rec completed Medications to take morning of surgery ----- Protonix  and Prozac  Diabetic medication -----  GLP1 agonist last dose: Zepbound  15mg  weekly. Last dose 03/14/24. GLP1 instructions: Hold any further doses until after surgery.  Patient instructed no nail polish to be worn day of surgery Patient instructed to bring photo id and insurance card day of surgery Patient aware to have Driver (ride ) / caregiver    for 24 hours after surgery - Husband Yancy Kipper Patient Special Instructions ----- Shower with CHG night before surgery. Pre-Op special Instructions -----  Patient verbalized understanding of instructions that were given at this phone interview. Patient denies chest pain, sob, fever, cough at the interview.

## 2024-03-19 ENCOUNTER — Ambulatory Visit: Admitting: Obstetrics and Gynecology

## 2024-03-19 ENCOUNTER — Encounter: Payer: Self-pay | Admitting: Obstetrics and Gynecology

## 2024-03-19 VITALS — BP 123/80 | HR 69 | Ht 66.0 in | Wt 181.0 lb

## 2024-03-19 DIAGNOSIS — Z01818 Encounter for other preprocedural examination: Secondary | ICD-10-CM

## 2024-03-19 MED ORDER — ONDANSETRON HCL 4 MG PO TABS: 4.0000 mg | ORAL_TABLET | Freq: Three times a day (TID) | ORAL | 0 refills | Status: DC | PRN

## 2024-03-19 MED ORDER — IBUPROFEN 600 MG PO TABS: 600.0000 mg | ORAL_TABLET | Freq: Four times a day (QID) | ORAL | 0 refills | Status: DC | PRN

## 2024-03-19 MED ORDER — GABAPENTIN 100 MG PO CAPS: 100.0000 mg | ORAL_CAPSULE | Freq: Three times a day (TID) | ORAL | 0 refills | Status: DC

## 2024-03-19 MED ORDER — ACETAMINOPHEN 500 MG PO TABS: 500.0000 mg | ORAL_TABLET | Freq: Four times a day (QID) | ORAL | 0 refills | Status: DC | PRN

## 2024-03-19 MED ORDER — POLYETHYLENE GLYCOL 3350 17 GM/SCOOP PO POWD: 17.0000 g | Freq: Every day | ORAL | 0 refills | Status: AC

## 2024-03-19 NOTE — Progress Notes (Signed)
 [facility] Pre-Operative Exam  Subjective Chief Complaint: Tammy Olsen presents for a preoperative encounter.   History of Present Illness: Tammy Olsen is a 49 y.o. female who presents for preoperative visit.  She is scheduled to undergo Exam under anesthesia, robotic assisted laparoscopic hysterectomy, bilateral salpingectomy, sacrocolpopexy, possible anterior/posterior repair, perineorrhaphy, midurethral sling, cystourethroscopy  on 03/25/24.  Her symptoms include pelvic organ prolapse and stress urinary leakage, and she was was found to have Stage III anterior, Stage III posterior, Stage IV apical prolapse.   CST showed: CMG showed within normal limits sensation, and normal cystometric capacity. Findings negative for stress incontinence, negative for detrusor overactivity.   Past Medical History:  Diagnosis Date   Allergy    Anxiety    Arthritis    knees   Complication of anesthesia    BP and HR issues they would not let me sleep because my heart rate would drop   GERD (gastroesophageal reflux disease)    HA (headache)    Hypertension    IBS (irritable bowel syndrome)    Injury of long thoracic nerve    right   Optic nerve edema    Sinus congestion    Upset stomach    Vitamin D  deficiency      Past Surgical History:  Procedure Laterality Date   COLONOSCOPY WITH ESOPHAGOGASTRODUODENOSCOPY (EGD) AND ESOPHAGEAL DILATION (ED)  12/16/2021   [facility]   KNEE ARTHROSCOPY Left 07/30/2020   Dr. Gerome    is allergic to adipex-p  [phentermine ] and grass pollen([id number] vern).   Family History  Problem Relation Age of Onset   Arthritis Mother    Cancer - Prostate Father    Gout Father    Bladder Cancer Father    Stomach cancer Neg Hx    Colon cancer Neg Hx    Esophageal cancer Neg Hx    Colon polyps Neg Hx    Renal cancer Neg Hx     Social History[1]   Review of Systems was negative for a full 10 system review except as noted in  the History of Present Illness.  Current Medications[2]   Objective There were no vitals filed for this visit.  Gen: NAD CV: S1 S2 RRR Lungs: Clear to auscultation bilaterally Abd: soft, nontender   Previous Pelvic Exam showed: POP-Q   3                                            Aa   3                                           Ba   4                                              C    6                                            Gh   4  Pb   9                                            tvl    0                                            Ap   0                                            Bp   0                                              D      Assessment/ Plan  Assessment: The patient is a 50 y.o. year old scheduled to undergo Exam under anesthesia, robotic assisted laparoscopic hysterectomy, bilateral salpingectomy, sacrocolpopexy, possible anterior/posterior repair, perineorrhaphy, midurethral sling, cystourethroscopy . Verbal consent was obtained for these procedures.  Plan: General Surgical Consent: The patient has previously been counseled on alternative treatments, and the decision by the patient and provider was to proceed with the procedure listed above.  For all procedures, there are risks of bleeding, infection, damage to surrounding organs including but not limited to bowel, bladder, blood vessels, ureters and nerves, and need for further surgery if an injury were to occur. These risks are all low with minimally invasive surgery.   There are risks of numbness and weakness at any body site or buttock/rectal pain.  It is possible that baseline pain can be worsened by surgery, either with or without mesh. If surgery is vaginal, there is also a low risk of possible conversion to laparoscopy or open abdominal incision where indicated. Very rare risks include blood transfusion, blood clot, heart attack, pneumonia, or  death.   There is also a risk of short-term postoperative urinary retention with need to use a catheter. About half of patients need to go home from surgery with a catheter, which is then later removed in the office. The risk of long-term need for a catheter is very low. There is also a risk of worsening of overactive bladder.   Sling: The effectiveness of a midurethral vaginal mesh sling is approximately 85%, and thus, there will be times when you may leak urine after surgery, especially if your bladder is full or if you have a strong cough. There is a balance between making the sling tight enough to treat your leakage but not too tight so that you have long-term difficulty emptying your bladder. A mesh sling will not directly treat overactive bladder/urge incontinence and may worsen it.  There is an FDA safety notification on vaginal mesh procedures for prolapse but NOT mesh slings. We have extensive experience and training with mesh placement and we have close postoperative follow up to identify any potential complications from mesh. It is important to realize that this mesh is a permanent implant that cannot be easily removed. There are rare risks of mesh exposure (2-4%), pain with intercourse (0-7%), and infection (<1%). The risk of mesh exposure if  more likely in a woman with risks for poor healing (prior radiation, poorly controlled diabetes, or immunocompromised). The risk of new or worsened chronic pain after mesh implant is more common in women with baseline chronic pain and/or poorly controlled anxiety or depression. Approximately 2-4% of patients will experience longer-term post-operative voiding dysfunction that may require surgical revision of the sling. We also reviewed that postoperatively, her stream may not be as strong as before surgery.    Prolapse (with or without mesh): Risk factors for surgical failure  include things that put pressure on your pelvis and the surgical repair, including  obesity, chronic cough, and heavy lifting or straining (including lifting children or adults, straining on the toilet, or lifting heavy objects such as furniture or anything weighing >25 lbs. Risks of recurrence is 20-30% with vaginal native tissue repair and a less than 10% with sacrocolpopexy with mesh.    Sacrocolpopexy: Mesh implants may provide more prolapse support, but do have some unique risks to consider. It is important to understand that mesh is permanent and cannot be easily removed. Risks of abdominal sacrocolpopexy mesh include mesh exposure (~3-6%), painful intercourse (recent studies show lower rates after surgery compared to before, with ~5-8% risk of new onset), and very rare risks of bowel or bladder injury or infection (<1%). The risk of mesh exposure is more likely in a woman with risks for poor healing (prior radiation, poorly controlled diabetes, or immunocompromised). The risk of new or worsened chronic pain after mesh implant is more common in women with baseline chronic pain and/or poorly controlled anxiety or depression. There is an FDA safety notification on vaginal mesh procedures for prolapse but NOT abdominal mesh procedures and therefore does not apply to your surgery. We have extensive experience and training with mesh placement and we have close postoperative follow up to identify any potential complications from mesh.    We discussed consent for blood products. Risks for blood transfusion include allergic reactions, other reactions that can affect different body organs and managed accordingly, transmission of infectious diseases such as HIV or Hepatitis. However, the blood is screened. Patient consents for blood products.  Pre-operative instructions:  She was instructed to not take Aspirin/NSAIDs x 7days prior to surgery. Hold  Zepbound . Antibiotic prophylaxis was ordered as indicated.  Catheter use: Patient will go home with foley if needed after post-operative voiding  trial.  Post-operative instructions:  She was provided with specific post-operative instructions, including precautions and signs/symptoms for which we would recommend contacting us , in addition to daytime and after-hours contact phone numbers. This was provided on a handout.   Post-operative medications: Prescriptions for motrin , tylenol , miralax , zofran , and Gabapentin  were sent to her pharmacy. Discussed using ibuprofen  and tylenol  on a schedule to limit use of narcotics.   Laboratory testing:  We will check labs: CBC and Type and Screen  Preoperative clearance:  She does not require surgical clearance.    Post-operative follow-up:  A post-operative appointment will be made for 6 weeks from the date of surgery. If she needs a post-operative nurse visit for a voiding trial, that will be set up after she leaves the hospital.    Patient will call the clinic or use MyChart should anything change or any new issues arise.   Anberlyn Feimster G Serapio Edelson, NP      [1]  Social History Tobacco Use   Smoking status: Never   Smokeless tobacco: Never  Vaping Use   Vaping status: Never Used  Substance Use Topics  Alcohol use: No    Alcohol/week: 0.0 standard drinks of alcohol   Drug use: No  [2]  Current Outpatient Medications:    Cholecalciferol (VITAMIN D ) 50 MCG (2000 UT) CAPS, Take 4,000 Units by mouth. , Disp: , Rfl:    FLUoxetine  (PROZAC ) 40 MG capsule, Take 1 capsule (40 mg total) by mouth daily., Disp: 90 capsule, Rfl: 3   glucosamine-chondroitin 500-400 MG tablet, Take 1 tablet by mouth 2 (two) times daily., Disp: , Rfl:    levonorgestrel (MIRENA, 52 MG,) 20 MCG/24HR IUD, Mirena 20 mcg/24 hours (7 yrs) 52 mg intrauterine device  Take 1 insert every day by intrauterine route., Disp: , Rfl:    losartan  (COZAAR ) 25 MG tablet, Take 1 tablet (25 mg total) by mouth daily., Disp: 90 tablet, Rfl: 1   Multiple Vitamins-Minerals (WOMENS MULTIVITAMIN PO), Take by mouth., Disp: , Rfl:    pantoprazole   (PROTONIX ) 40 MG tablet, Take 1 tablet (40 mg total) by mouth daily., Disp: 90 tablet, Rfl: 3   PREBIOTIC PRODUCT PO, Take by mouth., Disp: , Rfl:    Tirzepatide -Weight Management 15 MG/0.5ML SOLN, Inject 15 mg into the skin once a week., Disp: 2 mL, Rfl: 2

## 2024-03-19 NOTE — H&P (Addendum)
 [facility] H&P  Subjective Chief Complaint: Tammy Olsen presents for a preoperative encounter.   History of Present Illness: Tammy Olsen is a 49 y.o. female who presents for preoperative visit.  She is scheduled to undergo Exam under anesthesia, robotic assisted laparoscopic hysterectomy, bilateral salpingectomy, sacrocolpopexy, possible anterior/posterior repair, perineorrhaphy, midurethral sling, cystourethroscopy  on 03/25/24.  Her symptoms include pelvic organ prolapse and stress urinary leakage, and she was was found to have Stage III anterior, Stage III posterior, Stage IV apical prolapse.   CST showed: CMG showed within normal limits sensation, and normal cystometric capacity. Findings negative for stress incontinence, negative for detrusor overactivity.   Past Medical History:  Diagnosis Date   Allergy    Anxiety    Arthritis    knees   Complication of anesthesia    BP and HR issues they would not let me sleep because my heart rate would drop   GERD (gastroesophageal reflux disease)    HA (headache)    Hypertension    IBS (irritable bowel syndrome)    Injury of long thoracic nerve    right   Optic nerve edema    Sinus congestion    Upset stomach    Vitamin D  deficiency      Past Surgical History:  Procedure Laterality Date   COLONOSCOPY WITH ESOPHAGOGASTRODUODENOSCOPY (EGD) AND ESOPHAGEAL DILATION (ED)  12/16/2021   [facility]   KNEE ARTHROSCOPY Left 07/30/2020   Dr. Gerome    is allergic to adipex-p  [phentermine ] and grass pollen([id number]-o-[id number]-swt vern).   Family History  Problem Relation Age of Onset   Arthritis Mother    Cancer - Prostate Father    Gout Father    Bladder Cancer Father    Stomach cancer Neg Hx    Colon cancer Neg Hx    Esophageal cancer Neg Hx    Colon polyps Neg Hx    Renal cancer Neg Hx     Social History[1]   Review of Systems was negative for a full 10 system review except as noted in the History of  Present Illness.  Current Medications[2]   Objective There were no vitals filed for this visit.  Gen: NAD CV: S1 S2 RRR Lungs: Clear to auscultation bilaterally Abd: soft, nontender   Previous Pelvic Exam showed: POP-Q   3                                            Aa   3                                           Ba   4                                              C    6                                            Gh   4  Pb   9                                            tvl    0                                            Ap   0                                            Bp   0                                              D      Assessment/ Plan  Assessment: The patient is a 49 y.o. year old scheduled to undergo Exam under anesthesia, robotic assisted laparoscopic hysterectomy, bilateral salpingectomy, sacrocolpopexy, possible anterior/posterior repair, perineorrhaphy, midurethral sling, cystourethroscopy . Verbal consent was obtained for these procedures.  Per Dr. Prentiss (PCP) regarding history of IIH: There is no contraindication to general anesthesia. Avoidance of prolonged hypercapnia, excessive Trendelenburg positioning when feasible, and large acute fluid shifts is reasonable. Routine anesthetic monitoring is sufficient; no special neurologic monitoring is required. Low risk of postoperative IIH exacerbation; monitor clinically for headache or visual symptoms postoperatively.    [1]  Social History Tobacco Use   Smoking status: Never   Smokeless tobacco: Never  Vaping Use   Vaping status: Never Used  Substance Use Topics   Alcohol use: No    Alcohol/week: 0.0 standard drinks of alcohol   Drug use: No  [2] No current facility-administered medications for this encounter.  Current Outpatient Medications:    acetaminophen  (TYLENOL ) 500 MG tablet, Take 1 tablet (500 mg total) by mouth every 6 (six) hours as  needed (pain)., Disp: 30 tablet, Rfl: 0   Cholecalciferol (VITAMIN D ) 50 MCG (2000 UT) CAPS, Take 4,000 Units by mouth. , Disp: , Rfl:    FLUoxetine  (PROZAC ) 40 MG capsule, Take 1 capsule (40 mg total) by mouth daily., Disp: 90 capsule, Rfl: 3   gabapentin  (NEURONTIN ) 100 MG capsule, Take 1 capsule (100 mg total) by mouth 3 (three) times daily., Disp: 45 capsule, Rfl: 0   glucosamine-chondroitin 500-400 MG tablet, Take 1 tablet by mouth 2 (two) times daily., Disp: , Rfl:    ibuprofen  (ADVIL ) 600 MG tablet, Take 1 tablet (600 mg total) by mouth every 6 (six) hours as needed., Disp: 30 tablet, Rfl: 0   levonorgestrel (MIRENA, 52 MG,) 20 MCG/24HR IUD, Mirena 20 mcg/24 hours (7 yrs) 52 mg intrauterine device  Take 1 insert every day by intrauterine route., Disp: , Rfl:    losartan  (COZAAR ) 25 MG tablet, Take 1 tablet (25 mg total) by mouth daily., Disp: 90 tablet, Rfl: 1   Multiple Vitamins-Minerals (WOMENS MULTIVITAMIN PO), Take by mouth., Disp: , Rfl:    ondansetron  (ZOFRAN ) 4 MG tablet, Take 1 tablet (4 mg total) by mouth every 8 (eight) hours as needed for nausea or vomiting., Disp: 10 tablet, Rfl: 0   pantoprazole  (PROTONIX ) 40 MG  tablet, Take 1 tablet (40 mg total) by mouth daily., Disp: 90 tablet, Rfl: 3   polyethylene glycol powder (GLYCOLAX /MIRALAX ) 17 GM/SCOOP powder, Take 17 g by mouth daily. Drink 17g (1 scoop) dissolved in water per day., Disp: 255 g, Rfl: 0   PREBIOTIC PRODUCT PO, Take by mouth., Disp: , Rfl:    Tirzepatide -Weight Management 15 MG/0.5ML SOLN, Inject 15 mg into the skin once a week., Disp: 2 mL, Rfl: 2

## 2024-03-20 NOTE — Telephone Encounter (Signed)
 Copied from CRM #[id number]. Topic: Medical Record Request - Provider/Facility Request >> Mar 20, 2024 11:16 AM China J wrote: Reason for CRM: Channing calling from Dr. Garnette Lucey's office is wanting to let Dr. Prentiss know that they've only received the surgical pre-op form but nothing else. She is needing visit notes, labs, and EKG (if available) sent over as well.   Fax: [phone] [facility] Direct Line; [phone]

## 2024-03-21 ENCOUNTER — Encounter (HOSPITAL_COMMUNITY)
Admission: RE | Admit: 2024-03-21 | Discharge: 2024-03-21 | Disposition: A | Discharge status: Home / Self Care | Source: Ambulatory Visit | Attending: Obstetrics | Admitting: Obstetrics

## 2024-03-21 DIAGNOSIS — Z01818 Encounter for other preprocedural examination: Secondary | ICD-10-CM | POA: Diagnosis present

## 2024-03-21 DIAGNOSIS — I10 Essential (primary) hypertension: Secondary | ICD-10-CM | POA: Insufficient documentation

## 2024-03-21 LAB — BASIC METABOLIC PANEL WITH GFR
Anion gap: 7 (ref 5–15)
BUN: 16 mg/dL (ref 6–20)
CO2: 28 mmol/L (ref 22–32)
Calcium: 9.1 mg/dL (ref 8.9–10.3)
Chloride: 104 mmol/L (ref 98–111)
Creatinine, Ser: 0.73 mg/dL (ref 0.44–1.00)
GFR, Estimated: >60 mL/min
Glucose, Bld: 81 mg/dL (ref 70–99)
Potassium: 4 mmol/L (ref 3.5–5.1)
Sodium: 140 mmol/L (ref 135–145)

## 2024-03-21 LAB — CBC
HCT: 43.7 % (ref 36.0–46.0)
Hemoglobin: 14.5 g/dL (ref 12.0–15.0)
MCH: 30.3 pg (ref 26.0–34.0)
MCHC: 33.2 g/dL (ref 30.0–36.0)
MCV: 91.2 fL (ref 80.0–100.0)
Platelets: 259 K/uL (ref 150–400)
RBC: 4.79 MIL/uL (ref 3.87–5.11)
RDW: 12.8 % (ref 11.5–15.5)
WBC: 5 K/uL (ref 4.0–10.5)
nRBC: 0 % (ref 0.0–0.2)

## 2024-03-21 LAB — TYPE AND SCREEN
ABO/RH(D): O POS
Antibody Screen: NEGATIVE

## 2024-03-21 NOTE — Telephone Encounter (Signed)
 I have looked through pts chart. There was not an EKG done at her last appt. We received the form days after she was originally seen. I have faxed the OV note from 02/07/2024. The labs were already included in the note.

## 2024-03-24 NOTE — Anesthesia Preprocedure Evaluation (Signed)
"                                    Anesthesia Evaluation  Patient identified by MRN, date of birth, ID band Patient awake    Reviewed: Allergy & Precautions, NPO status , Patient's Chart, lab work & pertinent test results  History of Anesthesia Complications Negative for: history of anesthetic complications  Airway Mallampati: I  TM Distance: >3 FB Neck ROM: Full    Dental  (+) Dental Advisory Given   Pulmonary neg pulmonary ROS   breath sounds clear to auscultation       Cardiovascular hypertension, Pt. on medications  Rhythm:Regular Rate:Normal     Neuro/Psych  Headaches  Anxiety        GI/Hepatic Neg liver ROS,GERD  Medicated,,  Endo/Other  BMI 29 Mounjaro : 03/14/2024  Renal/GU negative Renal ROS     Musculoskeletal   Abdominal   Peds  Hematology Hb 14.5, plt 259k   Anesthesia Other Findings   Reproductive/Obstetrics                              Anesthesia Physical Anesthesia Plan  ASA: 2  Anesthesia Plan: General   Post-op Pain Management: Tylenol  PO (pre-op)*   Induction: Intravenous  PONV Risk Score and Plan: 3 and Dexamethasone  and Ondansetron   Airway Management Planned: Oral ETT  Additional Equipment: None  Intra-op Plan:   Post-operative Plan: Extubation in OR  Informed Consent: I have reviewed the patients History and Physical, chart, labs and discussed the procedure including the risks, benefits and alternatives for the proposed anesthesia with the patient or authorized representative who has indicated his/her understanding and acceptance.     Dental advisory given  Plan Discussed with: CRNA and Surgeon  Anesthesia Plan Comments:          Anesthesia Quick Evaluation  "

## 2024-03-25 ENCOUNTER — Encounter (HOSPITAL_COMMUNITY): Payer: Self-pay | Admitting: Obstetrics

## 2024-03-25 ENCOUNTER — Other Ambulatory Visit (HOSPITAL_COMMUNITY): Payer: Self-pay

## 2024-03-25 ENCOUNTER — Ambulatory Visit (HOSPITAL_COMMUNITY): Admitting: Anesthesiology

## 2024-03-25 ENCOUNTER — Encounter (HOSPITAL_COMMUNITY): Admitting: Anesthesiology

## 2024-03-25 ENCOUNTER — Encounter: Payer: Self-pay | Admitting: *Deleted

## 2024-03-25 ENCOUNTER — Encounter (HOSPITAL_COMMUNITY)
Admission: RE | Disposition: A | Discharge status: Home / Self Care | Payer: Self-pay | Source: Home / Self Care | Attending: Obstetrics

## 2024-03-25 ENCOUNTER — Ambulatory Visit (HOSPITAL_COMMUNITY)
Admission: RE | Admit: 2024-03-25 | Discharge: 2024-03-25 | Disposition: A | Discharge status: Home / Self Care | Attending: Obstetrics | Admitting: Obstetrics

## 2024-03-25 DIAGNOSIS — F419 Anxiety disorder, unspecified: Secondary | ICD-10-CM | POA: Insufficient documentation

## 2024-03-25 DIAGNOSIS — N812 Incomplete uterovaginal prolapse: Secondary | ICD-10-CM | POA: Diagnosis present

## 2024-03-25 DIAGNOSIS — N393 Stress incontinence (female) (male): Secondary | ICD-10-CM | POA: Diagnosis present

## 2024-03-25 DIAGNOSIS — Z79899 Other long term (current) drug therapy: Secondary | ICD-10-CM | POA: Insufficient documentation

## 2024-03-25 DIAGNOSIS — R519 Headache, unspecified: Secondary | ICD-10-CM | POA: Insufficient documentation

## 2024-03-25 DIAGNOSIS — N813 Complete uterovaginal prolapse: Secondary | ICD-10-CM

## 2024-03-25 DIAGNOSIS — K5900 Constipation, unspecified: Secondary | ICD-10-CM | POA: Diagnosis not present

## 2024-03-25 DIAGNOSIS — Z01818 Encounter for other preprocedural examination: Secondary | ICD-10-CM

## 2024-03-25 DIAGNOSIS — N879 Dysplasia of cervix uteri, unspecified: Secondary | ICD-10-CM | POA: Diagnosis not present

## 2024-03-25 DIAGNOSIS — N72 Inflammatory disease of cervix uteri: Secondary | ICD-10-CM | POA: Insufficient documentation

## 2024-03-25 DIAGNOSIS — Z793 Long term (current) use of hormonal contraceptives: Secondary | ICD-10-CM | POA: Insufficient documentation

## 2024-03-25 DIAGNOSIS — K219 Gastro-esophageal reflux disease without esophagitis: Secondary | ICD-10-CM | POA: Diagnosis not present

## 2024-03-25 DIAGNOSIS — I10 Essential (primary) hypertension: Secondary | ICD-10-CM | POA: Insufficient documentation

## 2024-03-25 HISTORY — PX: BLADDER SUSPENSION: SHX72

## 2024-03-25 HISTORY — PX: PERINEOPLASTY: SHX2218

## 2024-03-25 HISTORY — PX: EXAM UNDER ANESTHESIA, PELVIC: SHX7461

## 2024-03-25 HISTORY — PX: CYSTOSCOPY: SHX5120

## 2024-03-25 HISTORY — PX: RECTOCELE REPAIR: SHX761

## 2024-03-25 HISTORY — PX: HYSTERECTOMY, TOTAL, LAPAROSCOPIC, ROBOT-ASSISTED WITH SALPINGECTOMY: SHX7587

## 2024-03-25 HISTORY — PX: ROBOTIC ASSISTED LAPAROSCOPIC SACROCOLPOPEXY: SHX5388

## 2024-03-25 LAB — POCT PREGNANCY, URINE: Preg Test, Ur: NEGATIVE

## 2024-03-25 SURGERY — HYSTERECTOMY, TOTAL, LAPAROSCOPIC, ROBOT-ASSISTED WITH SALPINGECTOMY
Anesthesia: General | Site: Pelvis

## 2024-03-25 MED ORDER — ACETAMINOPHEN 10 MG/ML IV SOLN
1000.0000 mg | Freq: Once | INTRAVENOUS | Status: AC
  Administered 2024-03-25: 1000 mg via INTRAVENOUS

## 2024-03-25 MED ORDER — CHLORHEXIDINE GLUCONATE 0.12 % MT SOLN
15.0000 mL | Freq: Once | OROMUCOSAL | Status: AC
  Administered 2024-03-25: 15 mL via OROMUCOSAL

## 2024-03-25 MED ORDER — METHYLENE BLUE 20 MG/2ML IV SOSY
PREFILLED_SYRINGE | INTRAVENOUS | Status: AC
  Filled 2024-03-25: qty 2

## 2024-03-25 MED ORDER — PROPOFOL 500 MG/50ML IV EMUL
INTRAVENOUS | Status: DC | PRN
  Administered 2024-03-25 (×2): 175 ug/kg/min via INTRAVENOUS

## 2024-03-25 MED ORDER — ACETAMINOPHEN 500 MG PO TABS
ORAL_TABLET | ORAL | Status: AC
  Filled 2024-03-25: qty 2

## 2024-03-25 MED ORDER — SCOPOLAMINE 1 MG/3DAYS TD PT72
1.0000 | MEDICATED_PATCH | TRANSDERMAL | Status: DC
  Administered 2024-03-25: 1 mg via TRANSDERMAL

## 2024-03-25 MED ORDER — MIDAZOLAM HCL (PF) 2 MG/2ML IJ SOLN: 0.5000 mg | Freq: Once | INTRAMUSCULAR | Status: DC | PRN

## 2024-03-25 MED ORDER — MIDAZOLAM HCL (PF) 2 MG/2ML IJ SOLN
INTRAMUSCULAR | Status: DC | PRN
  Administered 2024-03-25: 2 mg via INTRAVENOUS

## 2024-03-25 MED ORDER — PROPOFOL 1000 MG/100ML IV EMUL
INTRAVENOUS | Status: AC
  Filled 2024-03-25: qty 300

## 2024-03-25 MED ORDER — HYDROMORPHONE HCL 1 MG/ML IJ SOLN
INTRAMUSCULAR | Status: DC | PRN
  Administered 2024-03-25 (×2): .5 mg via INTRAVENOUS

## 2024-03-25 MED ORDER — PROPOFOL 1000 MG/100ML IV EMUL
INTRAVENOUS | Status: AC
  Filled 2024-03-25: qty 100

## 2024-03-25 MED ORDER — HYDROMORPHONE HCL 1 MG/ML IJ SOLN
INTRAMUSCULAR | Status: AC
  Filled 2024-03-25: qty 1

## 2024-03-25 MED ORDER — ROCURONIUM BROMIDE 10 MG/ML (PF) SYRINGE
PREFILLED_SYRINGE | INTRAVENOUS | Status: DC | PRN
  Administered 2024-03-25: 10 mg via INTRAVENOUS
  Administered 2024-03-25: 20 mg via INTRAVENOUS
  Administered 2024-03-25: 10 mg via INTRAVENOUS
  Administered 2024-03-25: 20 mg via INTRAVENOUS
  Administered 2024-03-25: 60 mg via INTRAVENOUS
  Administered 2024-03-25 (×2): 10 mg via INTRAVENOUS
  Administered 2024-03-25 (×2): 5 mg via INTRAVENOUS
  Administered 2024-03-25 (×4): 20 mg via INTRAVENOUS

## 2024-03-25 MED ORDER — KETAMINE HCL 50 MG/5ML IJ SOSY
PREFILLED_SYRINGE | INTRAMUSCULAR | Status: AC
  Filled 2024-03-25: qty 5

## 2024-03-25 MED ORDER — OXYCODONE HCL 5 MG PO TABS
5.0000 mg | ORAL_TABLET | Freq: Once | ORAL | Status: AC | PRN
  Administered 2024-03-25: 5 mg via ORAL

## 2024-03-25 MED ORDER — ACETAMINOPHEN 500 MG PO TABS
1000.0000 mg | ORAL_TABLET | ORAL | Status: AC
  Administered 2024-03-25: 1000 mg via ORAL

## 2024-03-25 MED ORDER — LIDOCAINE 2% (20 MG/ML) 5 ML SYRINGE
INTRAMUSCULAR | Status: DC | PRN
  Administered 2024-03-25: 40 mg via INTRAVENOUS

## 2024-03-25 MED ORDER — KETOROLAC TROMETHAMINE 30 MG/ML IJ SOLN
INTRAMUSCULAR | Status: AC
  Filled 2024-03-25: qty 1

## 2024-03-25 MED ORDER — OXYCODONE HCL 5 MG PO TABS
ORAL_TABLET | ORAL | Status: AC
  Filled 2024-03-25: qty 1

## 2024-03-25 MED ORDER — ACETAMINOPHEN 10 MG/ML IV SOLN
INTRAVENOUS | Status: AC
  Filled 2024-03-25: qty 100

## 2024-03-25 MED ORDER — HYDROMORPHONE HCL 1 MG/ML IJ SOLN
INTRAMUSCULAR | Status: AC
  Filled 2024-03-25: qty 0.5

## 2024-03-25 MED ORDER — PROPOFOL 10 MG/ML IV BOLUS
INTRAVENOUS | Status: AC
  Filled 2024-03-25: qty 20

## 2024-03-25 MED ORDER — SODIUM CHLORIDE (PF) 0.9 % IJ SOLN
INTRAMUSCULAR | Status: AC
  Filled 2024-03-25: qty 20

## 2024-03-25 MED ORDER — FENTANYL CITRATE (PF) 100 MCG/2ML IJ SOLN
INTRAMUSCULAR | Status: DC | PRN
  Administered 2024-03-25: 100 ug via INTRAVENOUS

## 2024-03-25 MED ORDER — DEXAMETHASONE SOD PHOSPHATE PF 10 MG/ML IJ SOLN
INTRAMUSCULAR | Status: DC | PRN
  Administered 2024-03-25: 5 mg via INTRAVENOUS

## 2024-03-25 MED ORDER — MIDAZOLAM HCL 2 MG/2ML IJ SOLN
INTRAMUSCULAR | Status: AC
  Filled 2024-03-25: qty 2

## 2024-03-25 MED ORDER — CEFAZOLIN SODIUM-DEXTROSE 2-4 GM/100ML-% IV SOLN
2.0000 g | INTRAVENOUS | Status: AC
  Administered 2024-03-25 (×2): 2 g via INTRAVENOUS

## 2024-03-25 MED ORDER — CEFAZOLIN SODIUM-DEXTROSE 2-4 GM/100ML-% IV SOLN
INTRAVENOUS | Status: AC
  Filled 2024-03-25: qty 100

## 2024-03-25 MED ORDER — HYDROMORPHONE HCL 1 MG/ML IJ SOLN
0.2500 mg | INTRAMUSCULAR | Status: DC | PRN
  Administered 2024-03-25: 0.25 mg via INTRAVENOUS

## 2024-03-25 MED ORDER — SODIUM CHLORIDE (PF) 0.9 % IJ SOLN
INTRAMUSCULAR | Status: AC
  Filled 2024-03-25: qty 50

## 2024-03-25 MED ORDER — PHENAZOPYRIDINE HCL 100 MG PO TABS
200.0000 mg | ORAL_TABLET | ORAL | Status: AC
  Administered 2024-03-25: 200 mg via ORAL

## 2024-03-25 MED ORDER — OXYCODONE HCL 5 MG PO TABS
5.0000 mg | ORAL_TABLET | ORAL | 0 refills | Status: DC | PRN
  Filled 2024-03-25: qty 5, 1d supply, fill #0

## 2024-03-25 MED ORDER — SODIUM CHLORIDE 0.9 % IR SOLN
Status: DC | PRN
  Administered 2024-03-25 (×2): 1000 mL

## 2024-03-25 MED ORDER — SCOPOLAMINE 1 MG/3DAYS TD PT72
MEDICATED_PATCH | TRANSDERMAL | Status: AC
  Filled 2024-03-25: qty 1

## 2024-03-25 MED ORDER — ONDANSETRON HCL 4 MG/2ML IJ SOLN
INTRAMUSCULAR | Status: DC | PRN
  Administered 2024-03-25: 4 mg via INTRAVENOUS

## 2024-03-25 MED ORDER — PHENAZOPYRIDINE HCL 100 MG PO TABS
ORAL_TABLET | ORAL | Status: AC
  Filled 2024-03-25: qty 2

## 2024-03-25 MED ORDER — FLUORESCEIN SODIUM 10 % IV SOLN
INTRAVENOUS | Status: AC
  Filled 2024-03-25: qty 5

## 2024-03-25 MED ORDER — LIDOCAINE-EPINEPHRINE 1 %-1:100000 IJ SOLN
INTRAMUSCULAR | Status: DC | PRN
  Administered 2024-03-25: 20 mL
  Administered 2024-03-25: 8 mL
  Administered 2024-03-25: 10 mL

## 2024-03-25 MED ORDER — GABAPENTIN 300 MG PO CAPS
ORAL_CAPSULE | ORAL | Status: AC
  Filled 2024-03-25: qty 1

## 2024-03-25 MED ORDER — CHLORHEXIDINE GLUCONATE 0.12 % MT SOLN
OROMUCOSAL | Status: AC
  Filled 2024-03-25: qty 15

## 2024-03-25 MED ORDER — ORAL CARE MOUTH RINSE: 15.0000 mL | Freq: Once | OROMUCOSAL | Status: AC

## 2024-03-25 MED ORDER — METRONIDAZOLE 500 MG/100ML IV SOLN
500.0000 mg | Freq: Two times a day (BID) | INTRAVENOUS | Status: DC
  Administered 2024-03-25: 500 mg via INTRAVENOUS

## 2024-03-25 MED ORDER — LACTATED RINGERS IV SOLN: INTRAVENOUS | Status: DC

## 2024-03-25 MED ORDER — LIDOCAINE-EPINEPHRINE 1 %-1:100000 IJ SOLN
INTRAMUSCULAR | Status: AC
  Filled 2024-03-25: qty 2

## 2024-03-25 MED ORDER — MEPERIDINE HCL 25 MG/ML IJ SOLN: 6.2500 mg | INTRAMUSCULAR | Status: DC | PRN

## 2024-03-25 MED ORDER — PROPOFOL 10 MG/ML IV BOLUS
INTRAVENOUS | Status: DC | PRN
  Administered 2024-03-25: 150 mg via INTRAVENOUS
  Administered 2024-03-25: 50 mg via INTRAVENOUS

## 2024-03-25 MED ORDER — GABAPENTIN 300 MG PO CAPS
300.0000 mg | ORAL_CAPSULE | ORAL | Status: AC
  Administered 2024-03-25: 300 mg via ORAL

## 2024-03-25 MED ORDER — OXYCODONE HCL 5 MG/5ML PO SOLN: 5.0000 mg | Freq: Once | ORAL | Status: AC | PRN

## 2024-03-25 MED ORDER — KETAMINE HCL 10 MG/ML IJ SOLN
INTRAMUSCULAR | Status: DC | PRN
  Administered 2024-03-25 (×2): 10 mg via INTRAVENOUS
  Administered 2024-03-25: 30 mg via INTRAVENOUS

## 2024-03-25 MED ORDER — 0.9 % SODIUM CHLORIDE (POUR BTL) OPTIME
TOPICAL | Status: DC | PRN
  Administered 2024-03-25: 1000 mL

## 2024-03-25 MED ORDER — KETOROLAC TROMETHAMINE 30 MG/ML IJ SOLN
30.0000 mg | Freq: Once | INTRAMUSCULAR | Status: AC
  Administered 2024-03-25: 30 mg via INTRAVENOUS

## 2024-03-25 MED ORDER — METRONIDAZOLE 500 MG/100ML IV SOLN
INTRAVENOUS | Status: AC
  Filled 2024-03-25: qty 100

## 2024-03-25 MED ORDER — SUGAMMADEX SODIUM 200 MG/2ML IV SOLN
INTRAVENOUS | Status: DC | PRN
  Administered 2024-03-25: 200 mg via INTRAVENOUS

## 2024-03-25 MED ORDER — FENTANYL CITRATE (PF) 100 MCG/2ML IJ SOLN
INTRAMUSCULAR | Status: AC
  Filled 2024-03-25: qty 2

## 2024-03-25 SURGICAL SUPPLY — 73 items
BLADE CLIPPER SENSICLIP SURGIC (BLADE) ×6 IMPLANT
BLADE SURG 15 STRL LF DISP TIS (BLADE) ×6 IMPLANT
CHLORAPREP W/TINT 26 (MISCELLANEOUS) ×6 IMPLANT
COVER BACK TABLE 60X90IN (DRAPES) ×6 IMPLANT
COVER TIP SHEARS 8 DVNC (MISCELLANEOUS) ×6 IMPLANT
DEFOGGER SCOPE WARM SEASHARP (MISCELLANEOUS) ×6 IMPLANT
DERMABOND ADVANCED .7 DNX12 (GAUZE/BANDAGES/DRESSINGS) ×6 IMPLANT
DRAPE ARM DVNC X/XI (DISPOSABLE) ×24 IMPLANT
DRAPE COLUMN DVNC XI (DISPOSABLE) ×6 IMPLANT
DRAPE SHEET LG 3/4 BI-LAMINATE (DRAPES) IMPLANT
DRAPE SURG IRRIG POUCH 19X23 (DRAPES) ×6 IMPLANT
DRAPE UTILITY XL STRL (DRAPES) ×6 IMPLANT
DRIVER NDLE LRG 8 DVNC XI (INSTRUMENTS) ×6 IMPLANT
DRIVER NDLE MEGA SUTCUT DVNCXI (INSTRUMENTS) ×6 IMPLANT
ELECTRODE REM PT RTRN 9FT ADLT (ELECTROSURGICAL) ×6 IMPLANT
FORCEPS BPLR 8 MD DVNC XI (FORCEP) ×6 IMPLANT
GAUZE 4X4 16PLY ~~LOC~~+RFID DBL (SPONGE) IMPLANT
GLOVE BIOGEL PI IND STRL 6 (GLOVE) ×24 IMPLANT
GLOVE BIOGEL PI IND STRL 6.5 (GLOVE) IMPLANT
GLOVE BIOGEL PI IND STRL 7.0 (GLOVE) ×12 IMPLANT
GLOVE BIOGEL PI IND STRL 7.5 (GLOVE) IMPLANT
GLOVE BIOGEL PI MICRO STRL 5.5 (GLOVE) ×6 IMPLANT
GLOVE BIOGEL PI MICRO STRL 7 (GLOVE) IMPLANT
GLOVE SS PI 5.5 STRL (GLOVE) ×18 IMPLANT
GLOVE SURG SS PI 7.0 STRL IVOR (GLOVE) IMPLANT
GLOVE SURG SYN 6.5 PF PI (GLOVE) IMPLANT
GOWN STRL SURGICAL XL XLNG (GOWN DISPOSABLE) IMPLANT
GRASPER TIP-UP FEN DVNC XI (INSTRUMENTS) ×6 IMPLANT
HIBICLENS CHG 4% 4OZ BTL (MISCELLANEOUS) ×12 IMPLANT
HOLDER FOLEY CATH W/STRAP (MISCELLANEOUS) ×6 IMPLANT
HOOK RETRACT STAY BLUNT 12 (MISCELLANEOUS) IMPLANT
IRRIGATION SUCT STRKRFLW 2 WTP (MISCELLANEOUS) ×6 IMPLANT
IV 0.9% NACL 1000 ML (IV SOLUTION) IMPLANT
KIT PINK PAD W/HEAD ARM REST (MISCELLANEOUS) ×6 IMPLANT
KIT TURNOVER KIT B (KITS) ×6 IMPLANT
LEGGING LITHOTOMY PAIR STRL (DRAPES) ×6 IMPLANT
MANIPULATOR ADVINCU DEL 3.5 PL (MISCELLANEOUS) IMPLANT
MESH VERTESSA LITE -Y 2X4X3 (Mesh General) ×6 IMPLANT
NEEDLE HYPO 22X1.5 SAFETY MO (MISCELLANEOUS) ×6 IMPLANT
NEEDLE INSUFFLATION 14GA 120MM (NEEDLE) ×6 IMPLANT
OBTURATOR OPTICALSTD 8 DVNC (TROCAR) ×6 IMPLANT
PACK ROBOT WH (CUSTOM PROCEDURE TRAY) ×6 IMPLANT
PACK ROBOTIC GOWN (GOWN DISPOSABLE) ×6 IMPLANT
PAD OB MATERNITY 11 LF (PERSONAL CARE ITEMS) ×6 IMPLANT
RETRACTOR LONE STAR DISPOSABLE (INSTRUMENTS) ×6 IMPLANT
SCISSORS MNPLR CVD DVNC XI (INSTRUMENTS) ×6 IMPLANT
SEAL UNIV 5-12 XI (MISCELLANEOUS) ×30 IMPLANT
SET IRRIG Y-TYPE CYSTO (SET/KITS/TRAYS/PACK) ×6 IMPLANT
SET TUBE SMOKE EVAC HIGH FLOW (TUBING) ×6 IMPLANT
SLEEVE SCD COMPRESS KNEE MED (STOCKING) ×6 IMPLANT
SLING ADVANTAGE FIT TRANVAG (Sling) IMPLANT
SOLN 0.9% NACL POUR BTL 1000ML (IV SOLUTION) ×6 IMPLANT
SOLN STERILE WATER BTL 1000 ML (IV SOLUTION) ×6 IMPLANT
SPIKE FLUID TRANSFER (MISCELLANEOUS) ×6 IMPLANT
SUCTION TUBE FRAZIER 10FR DISP (SUCTIONS) ×6 IMPLANT
SURGIFLO W/THROMBIN 8M KIT (HEMOSTASIS) IMPLANT
SUT GORETEX NAB #0 THX26 36IN (SUTURE) ×12 IMPLANT
SUT MNCRL AB 4-0 PS2 18 (SUTURE) ×12 IMPLANT
SUT MON AB 2-0 SH 27 (SUTURE) ×6 IMPLANT
SUT MON AB 2-0 SH27 (SUTURE) IMPLANT
SUT PDS AB 2-0 CT2 27 (SUTURE) ×6 IMPLANT
SUT STRATA PDS 2-0 23 CT-1 (SUTURE) IMPLANT
SUT VIC AB 0 CT1 27XBRD ANBCTR (SUTURE) IMPLANT
SUT VIC AB 0 CT1 27XBRD ANTBC (SUTURE) ×6 IMPLANT
SUT VIC AB 0 CT1 27XCR 8 STRN (SUTURE) IMPLANT
SUT VIC AB 2-0 SH 27XBRD (SUTURE) ×6 IMPLANT
SUT VIC AB 3-0 SH 18 (SUTURE) IMPLANT
SUT VICRYL 2-0 SH 8X27 (SUTURE) IMPLANT
SUT VLOC 180 0 9IN GS21 (SUTURE) ×6 IMPLANT
SYR BULB EAR ULCER 3OZ GRN STR (SYRINGE) ×6 IMPLANT
TOWEL GREEN STERILE FF (TOWEL DISPOSABLE) ×6 IMPLANT
TRAY FOLEY W/BAG SLVR 14FR (SET/KITS/TRAYS/PACK) ×6 IMPLANT
UNDERPAD 30X36 HEAVY ABSORB (UNDERPADS AND DIAPERS) ×6 IMPLANT

## 2024-03-25 NOTE — Anesthesia Procedure Notes (Signed)
 Procedure Name: Intubation Date/Time: 03/25/2024 7:36 AM  Performed by: Theophilus Burnet, Aloysius Pour, CRNAPre-anesthesia Checklist: Patient identified, Emergency Drugs available, Suction available and Patient being monitored Patient Re-evaluated:Patient Re-evaluated prior to induction Oxygen Delivery Method: Circle system utilized Preoxygenation: Pre-oxygenation with 100% oxygen Induction Type: IV induction Ventilation: Mask ventilation without difficulty Laryngoscope Size: Mac and 3 Grade View: Grade I Tube type: Oral Tube size: 7.0 mm Number of attempts: 1 Airway Equipment and Method: Stylet Placement Confirmation: ETT inserted through vocal cords under direct vision, positive ETCO2 and breath sounds checked- equal and bilateral Secured at: 21 cm Tube secured with: Tape Dental Injury: Teeth and Oropharynx as per pre-operative assessment

## 2024-03-25 NOTE — Op Note (Signed)
 Operative Note  Preoperative Diagnosis: Stage III pelvic organ prolapse, stress urinary incontinence, constipation  Postoperative Diagnosis: Stage III pelvic organ prolapse, stress urinary incontinence, constipation  Procedures performed:  Robotic assisted laparoscopic hysterectomy, bilateral salpingectomy, sacrocolpopexy, posterior repair, perineal repair, midurethral sling, cystourethroscopy  Implants:  Implant Name Type Inv. Item Serial No. Manufacturer Lot No. LRB No. Used Action  MESH LAJUANDA MAJORIE SE [id number] Mesh General MESH LAJUANDA MAJORIE SE NITA  [facility] MEDICAL [phone]  1 Implanted  [employer] - [id number] Sling [employer]ING [employer] [id number]  1 Implanted    Attending Surgeon: Lianne Leila Gillis, MD  Assistant Surgeon: n/a  Assistant: Jorene Moats, PA  Anesthesia: General endotracheal  Findings: 1. On vaginal exam, stage III prolapse present  2. On cystoscopy, normal bladder and urethral mucosa without injury or lesion. Brisk bilateral ureteral efflux present.    3. On laparoscopy, no adhesions were noted.  Specimens: * No specimens in log *  Estimated blood loss: 75 mL  IV fluids: 1772 mL  Urine output: 385 mL  Complications: none  Procedure in Detail: After informed consent was obtained, the patient was taken to the operating room, where general anesthesia was induced and found to be adequate. She was placed in dorsolithotomy position in yellowfin stirrups. Her hips were noted not to be hyperflexed or hyperextended. Her arms were padded with gel pads and tucked to her sides. Her hands were surrounded by foam. A padded strap was placed across her chest with foam between the pad and her skin. She was noted to be appropriately positioned with all pressure points well padded and off tension. A tilt test showed no slippage. She was prepped and draped in the usual sterile fashion. A uterine manipulator was placed in the  uterus after sounding to 8 cm, an appropriately sized Koh ring was placed around the cervix, and a pneumo-occluder balloon was positioned in the vagina for later use.  A sterile Foley catheter was inserted.   1% lidocaine  with epinephrine  was injected in the supraumbilical area and an 8 mm supraumbilical skin incision was made with the scalpel.  A Veress needle was inserted into the incision, CO2 insufflation was started, a low opening pressure was noted, and pneumoperitoneum was obtained. The Veress needle was removed and a 8mm robotic trocar was placed. The robotic camera was inserted and intraperitoneal placement was confirmed. Survey of the abdomen and pelvis revealed insufflation into omentum without active bleeding. The sacrum appeared to be free of any adhesive disease. After determining placement for the other ports, Local anesthetic was injected at each site and two 8 mm incisions were made for robotic ports at 10 cm lateral to and at the level of the umbilical port. Two additional 8 mm incisions were made 10 cm lateral to these and 30 degrees down followed by 8 mm robotic ports - the right side for an assistant port. All trocars were placed sequentially under direct visualization of the camera. The patient was placed in Trendelenburg. The robot was docked on the patient's right side. Monopolar endoshears were placed in the right arm, a [place]  bipolar grasper was placed in the 2nd arm of the patient's left side, and a Tip up grasper was placed in the 3rd arm on the patient's left side.   Attention was then turned to the sacral promontory. The peritoneum overlying the sacral promontory was tented up, dissected sharply with monopolar scissors and electrosurgery using layer by layer technique.  The peritoneal incision was extended down to the posterior cul-de-sac. This was performed with care to avoid the ureter on the right side and the sigmoid colon and its mesentary on the left side. Two transverse  sutures of CV2 Gortex were placed in the anterior longitudinal ligament.  Attention was then turned to the robotic hysterectomy and bilateral salpingectomy. The ureter was identified and was found to be well away from the planned site of incision. Using the monopolar scissors bipolar cautery, the mesosalpinx was cauterized and transected. The right round ligament was grasped, cauterized, and transected with electrocautery. The anterior and posterior leaves of the broad ligament were taken down with cautery and sharp dissection. The uterine artery was skeletonized and the bladder flap was created on the right side with a combination of electrosurgery and sharp dissection. The KOH ring was identified. The right uterine artery was clamped, cauterized, and transected. In a similar fashion, the left side was taken down. Further sharp dissection with combination of cautery was performed to further develop the bladder flap. At this point, the KOH ring was completely hugging the cervix. The pneumo-occluder balloon in the vagina was inflated to maintain pneumoperitoneum. A colpotomy was performed with electrosurgical cutting current and the uterus and cervix were completely amputated from the vagina. The specimen was delivered through the vagina. The posterior portion of the vaginal cuff was then grasped and pulled up to maintain pneumoperitoneum. The pneumo-occluder balloon was then replaced in the vagina. The right hand instrument was changed to a suture-cut needle driver. The vaginal cuff was then closed using a 0 V-lock suture in two layers.    The Foley catheter was removed.  A 70-degree cystoscope was introduced, and 360-degree inspection revealed no injury, lesion or foreign body in the bladder. Brisk bilateral ureteral efflux was noted with the assistance of pyridium .  The bladder was drained and the cystoscope was removed.  The Foley catheter was replaced.  The right hand instrument was replaced with monopolar  endoshears. With a lucite probe in the vagina, the anterior vaginal dissection was then performed with sharp dissection and electrosurgery. The posterior vaginal dissection was then performed with sharp dissection and electrosurgery in order to dissect the rectum away from the posterior vagina.  A Y mesh was then inserted into the abdomen after trimming to appropriate size. With the probe in the vagina, the anterior leaf of the Y mesh was affixed to the anterior portion of the vagina using a 2-0 v-loc suture in a spiral pattern to distribute the suture evenly across the surface of the anterior mesh leaf. In a similar fashion, the posterior leaf of the Y mesh was attached to the posterior surface of the vagina with 2-0 v-loc suture.  The distal end of the mesh was then brought to overlie the sacrum. The correct amount of tension was determined in order to elevate the vagina, but not put the mesh under tension. The distal end of the mesh was then affixed to the anterior longitudinal sacral ligament using two interrupted transverse stitches of CV2 Gortex. The excess distal mesh was then cut and removed. The peritoneum was reapproximated over the mesh using 2-0 monocryl. The bladder flap was incorporated to completely retroperitonealize the mesh. All pedicles were carefully inspected and noted to be hemostatic as the CO2 gas was deflated. All instruments were removed from the patient's abdomen.   The Foley catheter was removed.  A 70-degree cystoscope was introduced, and 360-degree inspection revealed no injury, lesion or foreign body  in the bladder. Brisk bilateral ureteral efflux was noted with the assistance of pyridium .  The bladder was drained and the cystoscope was removed.  The Foley catheter was replaced.  The robot was undocked. The CO2 gas was removed and the ports were removed.  The skin incisions were closed with subcutaneous stitches of 4-0 Monocryl and covered with skin glue.     Vaginal exam noted  well suspended apex and anterior vaginal wall. The retropubic midurethral sling was performed next. Two Allis clamps were placed lateral to the location of the midurethra. 1% lidocaine  with epinephrine  was injected into the vaginal mucosa and laterally and at the suprapubic trocar exit sites marked 1 fingerbreadth lateral to midline at the level of the pubic symphysis.  Using a 15 blade scapel a vertical incision was made in the vaginal mucosa. Metzenbaum scissors were used to dissect the vaginal mucosa off of the underlying muscularis and to create the periurethral tunnels. The trocar and attached sling were introduced into the right side of the vaginal incision, just inferior to the pubic symphysis on the right side. The trocar was guided through the endopelvic fascia and directly behind the pubic symphysis through the retropubic space, vertically.  The trocar was guided out through the suprapubic region with the marked ipsilateral suprapubic exit site and shoulder. The trocar was similarly placed on the left side.  The Foley catheter was removed.  A 70-degree cystoscope was introduced, and 360-degree inspection revealed no trauma or trocars in the bladder, with bilateral ureteral efflux.  The bladder was drained and the cystoscope was removed. The sling was brought to lie beneath the mid-urethra.  An Allis clamp was placed on a loop of mesh as the trocars were removed. The sling was released and a right angle clamp was placed behind the sling to ensure a tension free placement as the plastic sheath was removed from the sling and the distal ends of the sling were trimmed just below the level of the skin incisions. The skin incisions were closed with dermabond.  Hemostasis was noted.  Tension-free positioning of the sling was confirmed and a Crede maneuver did not create leakage form the urethal meatus.  The vaginal incision was closed with 2-0 vicryl.  The Foley catheter was reinserted.  Attention was then  turned to the posterior vagina.  Two Allis clamps were placed at the introitus approximately 3cm from the urethra meatus. 1% lidocaine  with epinephrine  was injected into the vaginal mucosa in the posterior vaginal wall and perineum for hydrodissection and hemostasis. A diamond shape incision was made between these clamps with a 15 blade scalpel and a diamond shaped area of epithelium was cut at the introitus.  The rectovaginal septum was then dissected off the vaginal mucosa bilaterally. The rectovaginal septum was then plicated in a continuous running fashion with 2-0 PDS while one finger was placed in the rectum to prevent rectal penetration.  After placement of the first plication stitch two fingers were inserted into the vaginal to confirm adequate caliber.  The suture incorporated the perineal body in a U stitch fashion and the bulbocavernosus muscles. A 2-0 Vicryl was used in a subcuticular fashion to re-approximate the hymenal ring. After plication, the excess vaginal mucosa was trimmed and the vaginal mucosa was reapproximated using 2-0 Vicryl suture in a continuous fashion.  The vagina was copiously irrigated.  Hemostasis was noted. A rectal examination was normal and confirmed no sutures within the rectum. Three fingers passed through the vaginal opening without  difficulty.  Sponge, lap, and needle counts were correct x 2. The patient tolerated the procedure well. She was awakened from anesthesia and transferred to the recovery room in stable condition.

## 2024-03-25 NOTE — Discharge Instructions (Signed)

## 2024-03-25 NOTE — Transfer of Care (Signed)
 Immediate Anesthesia Transfer of Care Note  Patient: Tammy Olsen  Procedure(s) Performed: HYSTERECTOMY, TOTAL, LAPAROSCOPIC, ROBOT-ASSISTED WITH SALPINGECTOMY (Bilateral: Pelvis) SACROCOLPOPEXY, ROBOT-ASSISTED, LAPAROSCOPIC (Pelvis) PERINEOPLASTY (Vagina ) CREATION, URETHRAL SLING, RETROPUBIC APPROACH, USING POLYPROPYLENE TAPE (Vagina ) EXAM UNDER ANESTHESIA, PELVIC (Vagina ) CYSTOSCOPY (Bladder) COLPORRHAPHY, POSTERIOR, FOR RECTOCELE REPAIR (Vagina )  Patient Location: PACU  Anesthesia Type:General  Level of Consciousness: awake  Airway & Oxygen Therapy: Patient Spontanous Breathing  Post-op Assessment: Report given to RN  Post vital signs: stable  Last Vitals:  Vitals Value Taken Time  BP 151/88 03/25/24 13:35  Temp 36.6 C 03/25/24 13:35  Pulse 84 03/25/24 13:39  Resp 15 03/25/24 13:39  SpO2 93 % 03/25/24 13:39  Vitals shown include unfiled device data.  Last Pain:  Vitals:   03/25/24 1335  TempSrc:   PainSc: Asleep      Patients Stated Pain Goal: 8 (03/25/24 0554)  Complications: There were no known notable events for this encounter.

## 2024-03-25 NOTE — Anesthesia Postprocedure Evaluation (Signed)
"   Anesthesia Post Note  Patient: Kayonna Lawniczak Wenberg  Procedure(s) Performed: HYSTERECTOMY, TOTAL, LAPAROSCOPIC, ROBOT-ASSISTED WITH SALPINGECTOMY (Bilateral: Pelvis) SACROCOLPOPEXY, ROBOT-ASSISTED, LAPAROSCOPIC (Pelvis) PERINEOPLASTY (Vagina ) CREATION, URETHRAL SLING, RETROPUBIC APPROACH, USING POLYPROPYLENE TAPE (Vagina ) EXAM UNDER ANESTHESIA, PELVIC (Vagina ) CYSTOSCOPY (Bladder) COLPORRHAPHY, POSTERIOR, FOR RECTOCELE REPAIR (Vagina )     Patient location during evaluation: PACU Anesthesia Type: General Level of consciousness: awake and alert, oriented and patient cooperative Pain management: pain level controlled Vital Signs Assessment: post-procedure vital signs reviewed and stable Respiratory status: spontaneous breathing, nonlabored ventilation and respiratory function stable Cardiovascular status: blood pressure returned to baseline and stable Postop Assessment: no apparent nausea or vomiting Anesthetic complications: no   There were no known notable events for this encounter.  Last Vitals:  Vitals:   03/25/24 1415 03/25/24 1430  BP: (!) 143/84 135/80  Pulse: 78 61  Resp: 17 12  Temp:  36.6 C  SpO2: 96% 94%    Last Pain:  Vitals:   03/25/24 1430  TempSrc:   PainSc: Asleep                 Hargis Vandyne,E. Dandrea Medders      "

## 2024-03-25 NOTE — Interval H&P Note (Signed)
 History and Physical Interval Note:  03/25/2024 7:10 AM  Tammy Olsen  has presented today for surgery, with the diagnosis of stress incontinence and uterovag prolpapse incomplete.  The various methods of treatment have been discussed with the patient and family. After consideration of risks, benefits and other options for treatment, the patient has consented to  Procedures with comments: HYSTERECTOMY, TOTAL, LAPAROSCOPIC, ROBOT-ASSISTED WITH SALPINGECTOMY (Bilateral) - total case time requested 4 hours SACROCOLPOPEXY, ROBOT-ASSISTED, LAPAROSCOPIC (N/A) ANTERIOR (CYSTOCELE) AND POSTERIOR REPAIR (RECTOCELE)- possible (N/A) PERINEOPLASTY (N/A) CREATION, URETHRAL SLING, RETROPUBIC APPROACH, USING POLYPROPYLENE TAPE (N/A) EXAM UNDER ANESTHESIA, PELVIC as a surgical intervention.  The patient's history has been reviewed, patient examined, no change in status, stable for surgery.  I have reviewed the patient's chart and labs.  Questions were answered to the patient's satisfaction.     Inessa Wardrop ONEIDA Gillis

## 2024-03-25 NOTE — OR Nursing (Signed)
.  75mg  Dilaudid  wasted with Mliss Dawn, RN

## 2024-03-26 ENCOUNTER — Ambulatory Visit: Payer: Self-pay | Admitting: Obstetrics

## 2024-03-26 ENCOUNTER — Encounter (HOSPITAL_COMMUNITY): Payer: Self-pay | Admitting: Obstetrics

## 2024-03-26 LAB — SURGICAL PATHOLOGY

## 2024-03-27 ENCOUNTER — Ambulatory Visit

## 2024-03-27 ENCOUNTER — Encounter: Payer: Self-pay | Admitting: Obstetrics

## 2024-03-29 NOTE — Progress Notes (Signed)
 Tammy Olsen underwent Hysterectomy, Total, Laparoscopic, Robot-assisted With Salpingectomy - Bilateral, Sacrocolpopexy, Robot-assisted, Laparoscopic, Perineoplasty, Creation, Urethral Sling, Retropubic Approach, Using Polypropylene Tape, Exam Under Anesthesia, Pelvic, Cystoscopy, and Colporrhaphy, Posterior, For Rectocele Repair on 03/25/2024  She presents for a voiding trial.   Patient was identified with 2 identifiers.  The patient states she does not have any concerns with the foley placed.  300 mL of NS was instilled into the bladder via a catheter.  The catheter was removed and patient was instructed to void into the urinary hat.  She voided 325 mL.  The post void residual measured by bladder scan was 18 mL.  She did pass the voiding trial.  The patient was not sent home with a catheter.    The patient received aftercare instructions and will follow up as scheduled.

## 2024-03-29 NOTE — Progress Notes (Signed)
 Leita  underwent Hysterectomy, Total, Laparoscopic, Robot-assisted With Salpingectomy - Bilateral, Sacrocolpopexy, Robot-assisted, Laparoscopic, Perineoplasty, Creation, Urethral Sling, Retropubic Approach, Using Polypropylene Tape, Exam Under Anesthesia, Pelvic, Cystoscopy, and Colporrhaphy, Posterior, For Rectocele Repair  on 03/25/2024  with [] Dr Marilynne [] Dr Guadlupe.  The patient reports that her pain is controlled.  She is taking [] No Medication [x] Acetaminophen  500mg  every 6 hours [x] Ibuprofen  600mg  every 6 hours or []  Prescribed Narcotic.  Her pain level is 7[] with medication [x] Without medication is.   She reports vaginal bleeding. This is minimal. The patient is tolerating PO fluids and solids. She has not had a bowel movement and is taking Miralax  for a bowel regimen. She is not passing gas.  She was discharged with a catheter.   []  Discharged without a catheter, the patient does feel as if she is emptying her bladder.  [x] Discharged with a catheter, the patient is not having any concerns with her catheter.  She will return for a voiding trial. [] Verified scheduled date and time with patient.  She does not having any additional questions.  Reviewed Post operative instructions as needed to answer additional questions.   CC'd note to patient's provider.

## 2024-04-09 NOTE — Progress Notes (Unsigned)
 "    Patient Care Team: Prentiss Frieze, DO as PCP - General (Family Medicine)  Weight Management:   Starting weight: 259 lbs.  Starting date: 10/17/2019.  Current weight:  lbs.  Weight change since last encounter: -7 lbs.  Weight change since first encounter: -71 lbs.  Total weight loss percentage: -27.41%.  Body Fat Percentage: 40.0%.   There is no height or weight on file to calculate BMI.   Resting Metabolic Rate: *** Nutrition Plan: Practicing portion control & 1600 calories/day. Tries to get as much protien as she can.  Adherence to Nutrition Plan:  good.  Current Anti-Obesity Medication, including off-label: Zepbound  15mg /Microdosing at 3mg , Spliting and doing 1.5mg  every Monday and Thursday.  Recent Physical Activity: Walking 2-4 miles at least 3-4 days per week. .  Obesity Treatment History: No AOM coverage. Did not tolerate Semaglutide.   Sleep: Number of hours slept each night: ***. Sleep {ACTION; IS/IS NOT:21021397} restful.   Diagnoses and Orders:   No diagnosis found. No orders of the defined types were placed in this encounter.  No orders of the defined types were placed in this encounter.  Assessment & Plan:  Assessment and Plan     Frieze Prentiss, DO, MS (Nutrition), FAAFP, Dipl. ABOM Fellow, American Academy of Family Physicians Diplomate, American Board of Obesity Medicine [facility] Primary Care at [facility] [address] [place], [place] [address] Dept: [phone] Fax: [phone]  Subjective:     Review of Systems: Negative, with the exception of above mentioned in HPI.  History:   Reviewed by clinician on day of visit: allergies, medications, problem list, medical history, surgical history, family history, social history, and previous encounter notes.  Medications:   Show/hide medication list[1] Allergies[2]  Objective:   There were no vitals taken for this visit.  Physical Exam Results for orders  placed or performed during the hospital encounter of 03/25/24  Pregnancy, urine POC   Collection Time: 03/25/24  5:58 AM  Result Value Ref Range   Preg Test, Ur NEGATIVE NEGATIVE  Surgical pathology   Collection Time: 03/25/24  9:58 AM  Result Value Ref Range   SURGICAL PATHOLOGY      SURGICAL PATHOLOGY CASE: [id number] PATIENT: Tammy Olsen Surgical Pathology Report     Clinical History: Stress incontinence and uterovag prolapse incomplete (TP)     FINAL MICROSCOPIC DIAGNOSIS:  A. UTERUS WITH RIGHT AND LEFT FALLOPIAN TUBES, HYSTERECTOMY AND BILATERAL SALPINGECTOMY: Benign endometrium with progestational effect Acute and chronic cervicitis with squamous metaplasia Ectocervical hyperparakeratosis compatible with prolapse Benign fallopian tubes Negative for malignancy   GROSS DESCRIPTION:  Specimen: Received fresh is cervix, uterus, bilateral fallopian tubes Specimen integrity: Intact Size and shape: The uterine body has a normal anatomic shape, measuring 6.0 x 5.0 x 3.5 cm Weight: Uterus and cervix weigh 92 g Serosa: Pink-tan and smooth Cervix: 5.0 cm in length by 3.9 cm in diameter with a 0.9 cm slit shaped external os.  The ectocervical mucosa is pink-tan, smooth, and glistening.  The stroma is minimally roughened, but othe rwise intact. Sectioning reveals an underlying pink-tan cut surface, without grossly distinct lesions. Endometrium: Pink-tan and glistening, with a thickness of 0.1 cm.  There is a T-shaped intrauterine device included measuring 3.5 cm in length by 0.3 cm in diameter. Myometrium: Pink-tan, without distinct lesions, and with a thickness of 1.9 cm. Right adnexa: The right fallopian tube measures 6.5 cm in length by 0.5 cm in diameter and consists of a purple-pink, intact external  surface that includes attached fimbria.  Sectioning reveals a patent lumen, without distinct lesions. Left adnexa: The left fallopian tube measures 7.0 cm in  length by 0.5 cm in diameter and consists of a purple-pink, intact external surface that includes attached fimbria.  Sectioning reveals a patent lumen, without distinct lesions. Block Summary: A1 anterior cervix A2 anterior lower uterine segment A3 full-thickness anterior endomyometrium A4 posterior cervix A5 posterior lower uterine seg ment A6 full-thickness posterior endomyometrium A7 right tube A8 left tube [name], 03/25/2024)   Final Diagnosis performed by Prentice Pitcher, MD.   Electronically signed 03/26/2024 Technical and / or Professional components performed at [employer], [address] [place], [place] [address].  Immunohistochemistry Technical component (if applicable) was performed at [facility]. [address], [address], [place], [place] [address].   IMMUNOHISTOCHEMISTRY DISCLAIMER (if applicable): Some of these immunohistochemical stains may have been developed and the performance characteristics determine by [facility]. Some may not have been cleared or approved by the U.S. Food and Drug Administration. The FDA has determined that such clearance or approval is not necessary. This test is used for clinical purposes. It should not be regarded as investigational or for research. This laboratory is certified under the Clinical Laboratory  Improvement Amendments of 1988 (CLIA-88) as qualified to perform high complexity clinical laboratory testing.  The controls stained appropriately.   IHC stains are performed on formalin fixed, paraffin embedded tissue using a 3,3diaminobenzidine (DAB) chromogen and Leica Bond Autostainer System. The staining intensity of the nucleus is score manually and is reported as the percentage of tumor cell nuclei demonstrating specific nuclear staining. The specimens are fixed in 10% Neutral Formalin for at least 6 hours and up to 72hrs. These tests are validated on decalcified tissue.  Results should be interpreted with caution given the possibility of false negative results on decalcified specimens. Antibody Clones are as follows ER-clone 78F, PR-clone 16, Ki67- clone MM1. Some of these immunohistochemical stains may have been developed and the performance characteristics determined by [facility].     04/09/2024    PHQ2-9 Depression Screening   Little interest or pleasure in doing things    Feeling down, depressed, or hopeless    PHQ-2 - Total Score    Trouble falling or staying asleep, or sleeping too much    Feeling tired or having little energy    Poor appetite or overeating     Feeling bad about yourself - or that you are a failure or have let yourself or your family down    Trouble concentrating on things, such as reading the newspaper or watching television    Moving or speaking so slowly that other people could have noticed.  Or the opposite - being so fidgety or restless that you have been moving around a lot more than usual    Thoughts that you would be better off dead, or hurting yourself in some way    PHQ2-9 Total Score    If you checked off any problems, how difficult have these problems made it for you to do your work, take care of things at home, or get along with other people    Depression Interventions/Treatment         02/07/2024    8:48 AM  GAD 7 : Generalized Anxiety Score  Nervous, Anxious, on Edge 1   Control/stop worrying 1   Worry too much - different things 1   Trouble relaxing 1   Restless  1   Easily annoyed or irritable 1   Afraid - awful might happen 1   Total GAD 7 Score 7  Anxiety Difficulty Somewhat difficult     Data saved with a previous flowsheet row definition   Attestations:   {EW ATTESTATIONS:34266}    [1]  Outpatient Medications Prior to Visit  Medication Sig   acetaminophen  (TYLENOL ) 500 MG tablet Take 1 tablet (500 mg total) by mouth every 6 (six) hours as needed (pain).   Cholecalciferol (VITAMIN D )  50 MCG (2000 UT) CAPS Take 4,000 Units by mouth.    FLUoxetine  (PROZAC ) 40 MG capsule Take 1 capsule (40 mg total) by mouth daily.   gabapentin  (NEURONTIN ) 100 MG capsule Take 1 capsule (100 mg total) by mouth 3 (three) times daily.   glucosamine-chondroitin 500-400 MG tablet Take 1 tablet by mouth 2 (two) times daily.   ibuprofen  (ADVIL ) 600 MG tablet Take 1 tablet (600 mg total) by mouth every 6 (six) hours as needed.   levonorgestrel (MIRENA, 52 MG,) 20 MCG/24HR IUD Mirena 20 mcg/24 hours (7 yrs) 52 mg intrauterine device  Take 1 insert every day by intrauterine route.   losartan  (COZAAR ) 25 MG tablet Take 1 tablet (25 mg total) by mouth daily.   Multiple Vitamins-Minerals (WOMENS MULTIVITAMIN PO) Take by mouth.   ondansetron  (ZOFRAN ) 4 MG tablet Take 1 tablet (4 mg total) by mouth every 8 (eight) hours as needed for nausea or vomiting.   oxyCODONE  (OXY IR/ROXICODONE ) 5 MG immediate release tablet Take 1 tablet (5 mg total) by mouth every 4 (four) hours as needed (for pain score of 1-4).   pantoprazole  (PROTONIX ) 40 MG tablet Take 1 tablet (40 mg total) by mouth daily.   polyethylene glycol powder (GLYCOLAX /MIRALAX ) 17 GM/SCOOP powder Take 17 g by mouth daily. Drink 17g (1 scoop) dissolved in water per day.   PREBIOTIC PRODUCT PO Take by mouth.   Tirzepatide -Weight Management 15 MG/0.5ML SOLN Inject 15 mg into the skin once a week.   No facility-administered medications prior to visit.  [2]  Allergies Allergen Reactions   Adipex-P  [Phentermine ] Other (See Comments)    constipation   Grass Pollen(K-O-R-T-Swt Vern)     Other reaction(s): Not available   "

## 2024-04-10 ENCOUNTER — Ambulatory Visit: Admitting: Family Medicine

## 2024-04-10 NOTE — Telephone Encounter (Signed)
 Lvmtcb & sent mychart to reschedule her appt for today.

## 2024-04-17 NOTE — Progress Notes (Unsigned)
 "    Patient Care Team: Prentiss Frieze, DO as PCP - General (Family Medicine)  Weight Management:   Starting weight: 259 lbs.  Starting date: 10/17/2019.  Current weight:  lbs.  Today's date: 04/17/2024 Weight change since last encounter: -7 lbs.  Weight change since first encounter: -71 lbs.  Total weight loss percentage: -27.41%.  Body Fat Percentage: 40.0%.   There is no height or weight on file to calculate BMI.   Nutrition Plan: Practicing portion control & 1600 calories/day. Tries to get as much protien as she can.  Adherence to Nutrition Plan:  good.  Current Anti-Obesity Medication, including off-label: Zepbound  15mg /Microdosing at 3mg , Spliting and doing 1.5mg  every Monday and Thursday.  Recent Physical Activity: Walking 2-4 miles at least 3-4 days per week. .  Obesity Treatment History: No AOM coverage. Did not tolerate Semaglutide.    {EW OBESITY MED DOCUMENTATION:34479}  Diagnoses and Orders:   No diagnosis found. No orders of the defined types were placed in this encounter.  No orders of the defined types were placed in this encounter.  Assessment & Plan:  *** Subjective:  *** Review of Systems: Negative, with the exception of above mentioned in HPI.  History:   Reviewed by clinician on day of visit: allergies, medications, problem list, medical history, surgical history, family history, social history, and previous encounter notes.  Medications:   Show/hide medication list[1] Allergies[2] Physical Exam:   There were no vitals taken for this visit.  Physical Exam Laboratory and Imaging:   Results for orders placed or performed during the hospital encounter of 03/25/24  Pregnancy, urine POC   Collection Time: 03/25/24  5:58 AM  Result Value Ref Range   Preg Test, Ur NEGATIVE NEGATIVE  Surgical pathology   Collection Time: 03/25/24  9:58 AM  Result Value Ref Range   SURGICAL PATHOLOGY      SURGICAL PATHOLOGY CASE: [id number] PATIENT: LEITA KIPPER Surgical Pathology Report     Clinical History: Stress incontinence and uterovag prolapse incomplete (TP)     FINAL MICROSCOPIC DIAGNOSIS:  A. UTERUS WITH RIGHT AND LEFT FALLOPIAN TUBES, HYSTERECTOMY AND BILATERAL SALPINGECTOMY: Benign endometrium with progestational effect Acute and chronic cervicitis with squamous metaplasia Ectocervical hyperparakeratosis compatible with prolapse Benign fallopian tubes Negative for malignancy   GROSS DESCRIPTION:  Specimen: Received fresh is cervix, uterus, bilateral fallopian tubes Specimen integrity: Intact Size and shape: The uterine body has a normal anatomic shape, measuring 6.0 x 5.0 x 3.5 cm Weight: Uterus and cervix weigh 92 g Serosa: Pink-tan and smooth Cervix: 5.0 cm in length by 3.9 cm in diameter with a 0.9 cm slit shaped external os.  The ectocervical mucosa is pink-tan, smooth, and glistening.  The stroma is minimally roughened, but othe rwise intact. Sectioning reveals an underlying pink-tan cut surface, without grossly distinct lesions. Endometrium: Pink-tan and glistening, with a thickness of 0.1 cm.  There is a T-shaped intrauterine device included measuring 3.5 cm in length by 0.3 cm in diameter. Myometrium: Pink-tan, without distinct lesions, and with a thickness of 1.9 cm. Right adnexa: The right fallopian tube measures 6.5 cm in length by 0.5 cm in diameter and consists of a purple-pink, intact external surface that includes attached fimbria.  Sectioning reveals a patent lumen, without distinct lesions. Left adnexa: The left fallopian tube measures 7.0 cm in length by 0.5 cm in diameter and consists of a purple-pink, intact external surface that includes attached fimbria.  Sectioning reveals a patent lumen, without distinct lesions. Block Summary: A1  anterior cervix A2 anterior lower uterine segment A3 full-thickness anterior endomyometrium A4 posterior cervix A5 posterior lower uterine seg  ment A6 full-thickness posterior endomyometrium A7 right tube A8 left tube [name], 03/25/2024)   Final Diagnosis performed by Prentice Pitcher, MD.   Electronically signed 03/26/2024 Technical and / or Professional components performed at [employer], [address] [place], [place] [address].  Immunohistochemistry Technical component (if applicable) was performed at [facility]. [address], [place], [place] [address].   IMMUNOHISTOCHEMISTRY DISCLAIMER (if applicable): Some of these immunohistochemical stains may have been developed and the performance characteristics determine by [facility]. Some may not have been cleared or approved by the U.S. Food and Drug Administration. The FDA has determined that such clearance or approval is not necessary. This test is used for clinical purposes. It should not be regarded as investigational or for research. This laboratory is certified under the Clinical Laboratory  Improvement Amendments of 1988 (CLIA-88) as qualified to perform high complexity clinical laboratory testing.  The controls stained appropriately.   IHC stains are performed on formalin fixed, paraffin embedded tissue using a 3,3diaminobenzidine (DAB) chromogen and Leica Bond Autostainer System. The staining intensity of the nucleus is score manually and is reported as the percentage of tumor cell nuclei demonstrating specific nuclear staining. The specimens are fixed in 10% Neutral Formalin for at least 6 hours and up to 72hrs. These tests are validated on decalcified tissue. Results should be interpreted with caution given the possibility of false negative results on decalcified specimens. Antibody Clones are as follows ER-clone 18F, PR-clone 16, Ki67- clone MM1. Some of these immunohistochemical stains may have been developed and the performance characteristics determined by [facility].     Screening:    04/17/2024    PHQ2-9 Depression Screening   Little interest or pleasure in doing things    Feeling down, depressed, or hopeless    PHQ-2 - Total Score    Trouble falling or staying asleep, or sleeping too much    Feeling tired or having little energy    Poor appetite or overeating     Feeling bad about yourself - or that you are a failure or have let yourself or your family down    Trouble concentrating on things, such as reading the newspaper or watching television    Moving or speaking so slowly that other people could have noticed.  Or the opposite - being so fidgety or restless that you have been moving around a lot more than usual    Thoughts that you would be better off dead, or hurting yourself in some way    PHQ2-9 Total Score    If you checked off any problems, how difficult have these problems made it for you to do your work, take care of things at home, or get along with other people    Depression Interventions/Treatment         02/07/2024    8:48 AM  GAD 7 : Generalized Anxiety Score  Nervous, Anxious, on Edge 1   Control/stop worrying 1   Worry too much - different things 1   Trouble relaxing 1   Restless 1   Easily annoyed or irritable 1   Afraid - awful might happen 1   Total GAD 7 Score 7  Anxiety Difficulty Somewhat difficult     Data saved with a previous flowsheet row definition   Attestations:   {EW ATTESTATIONS:34266}  Geni Shutter, DO,  MS (Nutrition), FAAFP, Dipl. ABOM Fellow, American Academy of Family Physicians Diplomate, American Board of Obesity Medicine [facility] Primary Care at [facility] [address] [place], [place] [address] Dept: [phone] Fax: [phone]     [1]  Outpatient Medications Prior to Visit  Medication Sig   acetaminophen  (TYLENOL ) 500 MG tablet Take 1 tablet (500 mg total) by mouth every 6 (six) hours as needed (pain).   Cholecalciferol (VITAMIN D ) 50 MCG (2000 UT) CAPS Take 4,000 Units  by mouth.    FLUoxetine  (PROZAC ) 40 MG capsule Take 1 capsule (40 mg total) by mouth daily.   gabapentin  (NEURONTIN ) 100 MG capsule Take 1 capsule (100 mg total) by mouth 3 (three) times daily.   glucosamine-chondroitin 500-400 MG tablet Take 1 tablet by mouth 2 (two) times daily.   ibuprofen  (ADVIL ) 600 MG tablet Take 1 tablet (600 mg total) by mouth every 6 (six) hours as needed.   levonorgestrel (MIRENA, 52 MG,) 20 MCG/24HR IUD Mirena 20 mcg/24 hours (7 yrs) 52 mg intrauterine device  Take 1 insert every day by intrauterine route.   losartan  (COZAAR ) 25 MG tablet Take 1 tablet (25 mg total) by mouth daily.   Multiple Vitamins-Minerals (WOMENS MULTIVITAMIN PO) Take by mouth.   ondansetron  (ZOFRAN ) 4 MG tablet Take 1 tablet (4 mg total) by mouth every 8 (eight) hours as needed for nausea or vomiting.   oxyCODONE  (OXY IR/ROXICODONE ) 5 MG immediate release tablet Take 1 tablet (5 mg total) by mouth every 4 (four) hours as needed (for pain score of 1-4).   pantoprazole  (PROTONIX ) 40 MG tablet Take 1 tablet (40 mg total) by mouth daily.   polyethylene glycol powder (GLYCOLAX /MIRALAX ) 17 GM/SCOOP powder Take 17 g by mouth daily. Drink 17g (1 scoop) dissolved in water per day.   PREBIOTIC PRODUCT PO Take by mouth.   Tirzepatide -Weight Management 15 MG/0.5ML SOLN Inject 15 mg into the skin once a week.   No facility-administered medications prior to visit.  [2]  Allergies Allergen Reactions   Adipex-P  [Phentermine ] Other (See Comments)    constipation   Grass Pollen(K-O-R-T-Swt Vern)     Other reaction(s): Not available   "

## 2024-04-18 ENCOUNTER — Encounter: Payer: Self-pay | Admitting: Family Medicine

## 2024-04-18 ENCOUNTER — Ambulatory Visit: Admitting: Family Medicine

## 2024-04-18 VITALS — BP 132/70 | HR 77 | Ht 66.0 in | Wt 172.4 lb

## 2024-04-18 DIAGNOSIS — Z23 Encounter for immunization: Secondary | ICD-10-CM

## 2024-04-18 DIAGNOSIS — G8929 Other chronic pain: Secondary | ICD-10-CM

## 2024-04-18 DIAGNOSIS — Z1231 Encounter for screening mammogram for malignant neoplasm of breast: Secondary | ICD-10-CM

## 2024-04-18 DIAGNOSIS — Z8639 Personal history of other endocrine, nutritional and metabolic disease: Secondary | ICD-10-CM

## 2024-04-18 DIAGNOSIS — Z90710 Acquired absence of both cervix and uterus: Secondary | ICD-10-CM

## 2024-04-18 DIAGNOSIS — Z6827 Body mass index (BMI) 27.0-27.9, adult: Secondary | ICD-10-CM

## 2024-04-18 MED ORDER — ZEPBOUND 15 MG/0.5ML ~~LOC~~ SOAJ: 15.0000 mg | SUBCUTANEOUS | 3 refills | Status: DC

## 2024-04-18 NOTE — Progress Notes (Signed)
 "    Patient Care Team: Prentiss Frieze, DO as PCP - General (Family Medicine)  Weight Management:   Starting weight: 259 lbs.  Starting date: 10/17/2019.  Current weight: 172 lbs.  Today's date: 04/18/2024 Weight change since last encounter: -5 lbs.  Weight change since first encounter: -87 lbs.  Total weight loss percentage: -33.59%.  Body Fat Percentage: 40.0%.   Body mass index is 27.83 kg/m.   Nutrition Plan: Practicing portion control & 1600 calories/day. Tries to get as much protien as she can.  Adherence to Nutrition Plan:  good.  Current Anti-Obesity Medication, including off-label: Zepbound  15mg /Microdosing at 3mg , Spliting and doing 1.5mg  every Monday and Thursday.  Recent Physical Activity: Walking 2-4 miles at least 3-4 days per week. .  Obesity Treatment History: No AOM coverage. Did not tolerate Semaglutide.    Patient is under the care of an Obesity Medicine-certified physician providing longitudinal care using a comprehensive, pillar-based obesity treatment model (nutrition therapy, physical activity counseling, behavioral modification, pharmacotherapy when indicated, and medical monitoring), consistent with standards outlined by the Obesity [employer] Association. Obesity is treated as a medical disease, not a cosmetic condition. No contraindications identified (no personal or family history of medullary thyroid  carcinoma or MEN2; no history of pancreatitis). Weight, BMI, waist circumference, blood pressure, relevant metabolic markers (as applicable), medication tolerability, and adherence will be monitored at regular follow-up intervals. Discontinuation after initial response is associated with weight regain and disease progression. Ongoing therapy is medically appropriate for chronic disease management. Despite adherence, weight loss has been inadequate and/or not sustained. Continued lifestyle-only therapy is insufficient given the chronic disease biology of  obesity.  Diagnoses and Orders:   1. Chronic pain of both knees   2. Encounter for screening mammogram for malignant neoplasm of breast   3. S/P hysterectomy   4. Immunization due   5. Hx of obesity   6. Body mass index (BMI) of 27.0 to 27.9 in adult    Meds ordered this encounter  Medications   tirzepatide  (ZEPBOUND ) 15 MG/0.5ML Pen    Sig: Inject 15 mg into the skin once a week.    Dispense:  2 mL    Refill:  3   Orders Placed This Encounter  Procedures   MM DIGITAL SCREENING BILATERAL   Tdap vaccine greater than or equal to 7yo IM   Assessment & Plan:   Assessment & Plan Postoperative care after pelvic organ prolapse surgery Postoperative status following pelvic organ prolapse surgery, including hysterectomy, sling placement, and rectocele repair. Reports persistent pressure and altered sensation when urinating, likely due to the sling. Experiencing discomfort and burning sensation, likely from internal suturing. No heavy lifting or excessive movement reported. Recovery is ongoing, with follow-up scheduled for February 17th. - Continue to avoid heavy lifting and excessive movement. - Follow up with urogynecology on February 17th for postoperative evaluation.  Hx of Obesity Management with Tirzepatide  (Zepbound ) for weight management. Reports decreased appetite post-surgery and adjusted medication dosage to 6 units once a week. Issues with pharmacy not filling prescription for Zepbound  pens. Discussed the importance of maintaining a healthy weight and the potential impact of surgery on appetite and bowel movements. - Resent prescription for Tirzepatide  (Zepbound ) 15 mg pens to pharmacy. - Monitor weight and adjust medication dosage as needed.  Chronic pain of both knees with Baker's cyst Chronic knee pain with Baker's cyst. Reports no current pain or discomfort in knees post-surgery. Previously managed with oxycodone  and NSAIDs post-surgery. Discussed the importance of  maintaining  mobility and the potential need for future knee surgery. Advised to avoid activities that increase intra-abdominal pressure until cleared by urogynecology. - Encouraged walking and low-impact exercises as tolerated. - Will consider future knee surgery if symptoms worsen.  General Health Maintenance Routine health maintenance discussed, including mammogram and Pap smear. No Pap smear needed due to hysterectomy. Discussed the importance of regular screenings and vaccinations. - Ordered mammogram at preferred location.  Subjective:   History of Present Illness Tammy Olsen is a 49 year old female who presents for post-operative follow-up after a hysterectomy with additional pelvic repairs.  Post-operative pelvic symptoms - Underwent hysterectomy with rectocele repair and sling placement approximately one year ago; surgery duration was five hours, longer than expected. - Experienced difficult post-operative recovery, including urinary retention requiring catheterization for several days, which was uncomfortable. - Continues to experience pelvic pressure, especially when sitting or standing. - Altered sensation with urinary urge persists. - Believes she is still healing internally.  Bowel habit changes - Noticed changes in bowel movements since surgery, with initial difficulty in bowel regulation. - Resumed Zepbound  one week after surgery, reducing dose to six units once weekly. - Experienced weight loss attributed to decreased appetite and altered bowel habits post-surgery.  Musculoskeletal symptoms - History of knee issues including Baker's cyst and arthritis. - No current pain or discomfort in knees or foot since surgery.  Post-operative pain management - Used oxycodone  only in the initial days post-surgery. - Transitioned to Tylenol  and ibuprofen  for a couple of weeks post-operatively. - No recent use of pain medication.  Medication access issues - Prescription for  Zepbound  sent to pharmacy but not filled. - Received text confirmations of prescription being sent but has not received the medication.  Review of Systems: Negative, with the exception of above mentioned in HPI.  History:   Reviewed by clinician on day of visit: allergies, medications, problem list, medical history, surgical history, family history, social history, and previous encounter notes.  Medications:   Show/hide medication list[1] Allergies[2]  Physical Exam:   BP 132/70   Pulse 77   Ht 5' 6 (1.676 m)   Wt 172 lb 6.4 oz (78.2 kg)   LMP  (LMP Unknown)   SpO2 98%   BMI 27.83 kg/m   Physical Exam Constitutional:      General: She is not in acute distress.    Appearance: She is well-developed.  HENT:     Head: Normocephalic and atraumatic.  Eyes:     Conjunctiva/sclera: Conjunctivae normal.  Cardiovascular:     Rate and Rhythm: Normal rate and regular rhythm.     Heart sounds: Normal heart sounds.  Pulmonary:     Effort: Pulmonary effort is normal.     Breath sounds: Normal breath sounds.  Neurological:     General: No focal deficit present.     Mental Status: She is alert.  Psychiatric:        Behavior: Behavior normal.         [1]  Outpatient Medications Prior to Visit  Medication Sig   Cholecalciferol (VITAMIN D ) 50 MCG (2000 UT) CAPS Take 4,000 Units by mouth.    FLUoxetine  (PROZAC ) 40 MG capsule Take 1 capsule (40 mg total) by mouth daily.   glucosamine-chondroitin 500-400 MG tablet Take 1 tablet by mouth 2 (two) times daily.   losartan  (COZAAR ) 25 MG tablet Take 1 tablet (25 mg total) by mouth daily.   Multiple Vitamins-Minerals (WOMENS MULTIVITAMIN PO) Take by mouth.   pantoprazole  (  PROTONIX ) 40 MG tablet Take 1 tablet (40 mg total) by mouth daily.   polyethylene glycol powder (GLYCOLAX /MIRALAX ) 17 GM/SCOOP powder Take 17 g by mouth daily. Drink 17g (1 scoop) dissolved in water per day.   PREBIOTIC PRODUCT PO Take by mouth.   [DISCONTINUED]  Tirzepatide -Weight Management 15 MG/0.5ML SOLN Inject 15 mg into the skin once a week.   ibuprofen  (ADVIL ) 600 MG tablet Take 1 tablet (600 mg total) by mouth every 6 (six) hours as needed. (Patient not taking: Reported on 04/18/2024)   levonorgestrel (MIRENA, 52 MG,) 20 MCG/24HR IUD Mirena 20 mcg/24 hours (7 yrs) 52 mg intrauterine device  Take 1 insert every day by intrauterine route. (Patient not taking: Reported on 04/18/2024)   ondansetron  (ZOFRAN ) 4 MG tablet Take 1 tablet (4 mg total) by mouth every 8 (eight) hours as needed for nausea or vomiting. (Patient not taking: Reported on 04/18/2024)   [DISCONTINUED] acetaminophen  (TYLENOL ) 500 MG tablet Take 1 tablet (500 mg total) by mouth every 6 (six) hours as needed (pain). (Patient not taking: Reported on 04/18/2024)   [DISCONTINUED] gabapentin  (NEURONTIN ) 100 MG capsule Take 1 capsule (100 mg total) by mouth 3 (three) times daily. (Patient not taking: Reported on 04/18/2024)   [DISCONTINUED] oxyCODONE  (OXY IR/ROXICODONE ) 5 MG immediate release tablet Take 1 tablet (5 mg total) by mouth every 4 (four) hours as needed (for pain score of 1-4). (Patient not taking: Reported on 04/18/2024)   No facility-administered medications prior to visit.  [2]  Allergies Allergen Reactions   Adipex-P  [Phentermine ] Other (See Comments)    constipation   Grass Pollen([id number] Vern)     Other reaction(s): Not available   "

## 2024-04-19 MED ORDER — ZEPBOUND 15 MG/0.5ML ~~LOC~~ SOLN: 15.0000 mg | SUBCUTANEOUS | 3 refills | Status: AC

## 2024-04-19 NOTE — Addendum Note (Signed)
 Addended by: Larsen Dungan on: 04/19/2024 09:50 AM   Modules accepted: Orders

## 2024-04-30 ENCOUNTER — Encounter: Admitting: Obstetrics

## 2024-05-07 ENCOUNTER — Encounter: Admitting: Obstetrics

## 2024-05-16 ENCOUNTER — Ambulatory Visit: Admitting: Family Medicine

## 2024-05-30 ENCOUNTER — Ambulatory Visit: Admitting: Family Medicine
